# Patient Record
Sex: Male | Born: 1960 | Race: White | Hispanic: No | Marital: Married | Smoking: Current every day smoker
Health system: Southern US, Community
[De-identification: ages and names within clinical notes are randomized; demographics above are authoritative.]

## PROBLEM LIST (undated history)

## (undated) DIAGNOSIS — J45909 Unspecified asthma, uncomplicated: Secondary | ICD-10-CM

## (undated) DIAGNOSIS — K219 Gastro-esophageal reflux disease without esophagitis: Secondary | ICD-10-CM

## (undated) DIAGNOSIS — E785 Hyperlipidemia, unspecified: Secondary | ICD-10-CM

## (undated) DIAGNOSIS — J449 Chronic obstructive pulmonary disease, unspecified: Secondary | ICD-10-CM

## (undated) DIAGNOSIS — I10 Essential (primary) hypertension: Secondary | ICD-10-CM

## (undated) DIAGNOSIS — N200 Calculus of kidney: Secondary | ICD-10-CM

## (undated) HISTORY — PX: ESOPHAGOGASTRODUODENOSCOPY: SHX1529

## (undated) HISTORY — PX: OTHER SURGICAL HISTORY: SHX169

## (undated) HISTORY — DX: Chronic obstructive pulmonary disease, unspecified: J44.9

## (undated) HISTORY — DX: Gastro-esophageal reflux disease without esophagitis: K21.9

## (undated) HISTORY — DX: Unspecified asthma, uncomplicated: J45.909

## (undated) HISTORY — DX: Essential (primary) hypertension: I10

## (undated) HISTORY — PX: CHOLECYSTECTOMY: SHX55

## (undated) HISTORY — DX: Hyperlipidemia, unspecified: E78.5

## (undated) HISTORY — PX: BACK SURGERY: SHX140

## (undated) HISTORY — DX: Calculus of kidney: N20.0

---

## 1997-05-30 ENCOUNTER — Encounter: Admission: RE | Admit: 1997-05-30 | Discharge: 1997-08-28 | Payer: Self-pay | Admitting: Family Medicine

## 1997-06-08 ENCOUNTER — Ambulatory Visit: Admission: RE | Admit: 1997-06-08 | Discharge: 1997-06-08 | Payer: Self-pay | Admitting: Family Medicine

## 1997-10-07 ENCOUNTER — Ambulatory Visit: Admission: RE | Admit: 1997-10-07 | Discharge: 1997-10-07 | Payer: Self-pay | Admitting: *Deleted

## 2002-08-26 ENCOUNTER — Ambulatory Visit: Admission: RE | Admit: 2002-08-26 | Discharge: 2002-08-26 | Payer: Self-pay | Admitting: Family Medicine

## 2005-06-13 ENCOUNTER — Ambulatory Visit: Payer: Self-pay | Admitting: Gastroenterology

## 2005-06-27 ENCOUNTER — Ambulatory Visit: Payer: Self-pay | Admitting: Gastroenterology

## 2006-05-11 ENCOUNTER — Ambulatory Visit: Payer: Self-pay | Admitting: Gastroenterology

## 2006-05-13 ENCOUNTER — Ambulatory Visit: Payer: Self-pay | Admitting: Gastroenterology

## 2012-04-19 ENCOUNTER — Other Ambulatory Visit: Payer: Self-pay | Admitting: Nurse Practitioner

## 2012-05-04 ENCOUNTER — Other Ambulatory Visit: Payer: Self-pay | Admitting: Nurse Practitioner

## 2012-05-20 ENCOUNTER — Other Ambulatory Visit: Payer: Self-pay | Admitting: Nurse Practitioner

## 2012-05-31 ENCOUNTER — Ambulatory Visit (INDEPENDENT_AMBULATORY_CARE_PROVIDER_SITE_OTHER): Payer: BC Managed Care – PPO | Admitting: Nurse Practitioner

## 2012-05-31 ENCOUNTER — Encounter: Payer: Self-pay | Admitting: Nurse Practitioner

## 2012-05-31 ENCOUNTER — Telehealth: Payer: Self-pay | Admitting: Nurse Practitioner

## 2012-05-31 VITALS — BP 140/84 | HR 82 | Temp 98.0°F | Wt 284.0 lb

## 2012-05-31 DIAGNOSIS — I10 Essential (primary) hypertension: Secondary | ICD-10-CM | POA: Insufficient documentation

## 2012-05-31 DIAGNOSIS — J322 Chronic ethmoidal sinusitis: Secondary | ICD-10-CM

## 2012-05-31 DIAGNOSIS — E785 Hyperlipidemia, unspecified: Secondary | ICD-10-CM | POA: Insufficient documentation

## 2012-05-31 DIAGNOSIS — J45909 Unspecified asthma, uncomplicated: Secondary | ICD-10-CM

## 2012-05-31 DIAGNOSIS — J209 Acute bronchitis, unspecified: Secondary | ICD-10-CM

## 2012-05-31 DIAGNOSIS — K219 Gastro-esophageal reflux disease without esophagitis: Secondary | ICD-10-CM | POA: Insufficient documentation

## 2012-05-31 MED ORDER — HYDROCODONE-HOMATROPINE 5-1.5 MG/5ML PO SYRP: 5.0000 mL | ORAL_SOLUTION | Freq: Three times a day (TID) | ORAL | Status: DC | PRN

## 2012-05-31 MED ORDER — AMOXICILLIN 875 MG PO TABS: 875.0000 mg | ORAL_TABLET | Freq: Two times a day (BID) | ORAL | Status: DC

## 2012-05-31 NOTE — Progress Notes (Signed)
  Subjective:    Patient ID: David Ortega, male    DOB: 1960-04-08, 52 y.o.   MRN: [id number]  HPI-1. Patient in C/o headache and chest congestion- No fever- SLight cough 2.Pain left flank area which started Saturday- intermittent- coughing and moving makes pain start. Sitting still makes pain better.   Review of Systems  Constitutional: Negative for fever and chills.  HENT: Positive for congestion, rhinorrhea, postnasal drip and sinus pressure.   Respiratory: Positive for cough (nonproductive). Negative for shortness of breath and wheezing.   Cardiovascular: Positive for chest pain.  Genitourinary: Negative.   Psychiatric/Behavioral: Negative.        Objective:   Physical Exam  Constitutional: He is oriented to person, place, and time. He appears well-developed and well-nourished.  HENT:  Head: Normocephalic.  Right Ear: Tympanic membrane and external ear normal.  Left Ear: Tympanic membrane normal.  Nose: Mucosal edema and rhinorrhea present.  Mouth/Throat: Posterior oropharyngeal edema present.  Eyes: Pupils are equal, round, and reactive to light.  Neck: Normal range of motion.  Cardiovascular: Normal rate and normal heart sounds.   Pulmonary/Chest: Effort normal and breath sounds normal. He has no wheezes. He has no rales. He exhibits tenderness (mild left lower rib cage).  Abdominal: Soft. Bowel sounds are normal.  Lymphadenopathy:    He has no cervical adenopathy.  Neurological: He is alert and oriented to person, place, and time. He has normal reflexes.  Skin: Skin is warm.  Psychiatric: He has a normal mood and affect. His behavior is normal. Judgment and thought content normal.   BP 140/84  Pulse 82  Temp(Src) 98 F (36.7 C) (Oral)  Wt 284 lb (128.822 kg)        Assessment & Plan:  Bronchitis/ sinustis -Hycodan as RX -Amoxicillin as Rx First 24 Hours-Clear liquids  popsicles  Jello  gatorade  Sprite Second 24 hours-Add Full liquids ( Liquids you  cant see through) Third 24 hours- Bland diet ( foods that are baked or broiled)  *avoiding fried foods and highly spiced foods* During these 3 days  Avoid milk, cheese, ice cream or any other dairy products  Avoid caffeine- REMEMBER Mt. Dew and Mello Yellow contain lots of caffeine You should eat and drink in  Frequent small volumes If no improvement in symptoms or worsen in 2-3 days should RETRUN TO OFFICE or go to ER!    Mary-Margaret Daphine Deutscher, FNP

## 2012-05-31 NOTE — Patient Instructions (Signed)

## 2012-05-31 NOTE — Telephone Encounter (Signed)
APPT MADE

## 2012-06-06 ENCOUNTER — Other Ambulatory Visit: Payer: Self-pay | Admitting: Nurse Practitioner

## 2012-06-21 ENCOUNTER — Other Ambulatory Visit: Payer: Self-pay | Admitting: Nurse Practitioner

## 2012-06-23 NOTE — Telephone Encounter (Signed)
Patients last labs done on 11-09-11. Last seen in office on

## 2012-06-23 NOTE — Telephone Encounter (Signed)
Patients last labs done on 11-09-11. Last seen in office on 05-31-12 for acute visit. Please advise

## 2012-08-30 ENCOUNTER — Other Ambulatory Visit: Payer: Self-pay | Admitting: *Deleted

## 2012-08-30 MED ORDER — LOSARTAN POTASSIUM 50 MG PO TABS: ORAL_TABLET | ORAL | Status: DC

## 2012-10-04 ENCOUNTER — Other Ambulatory Visit: Payer: Self-pay | Admitting: *Deleted

## 2012-10-04 NOTE — Telephone Encounter (Signed)
Can't do refill on simvastatin until has labs

## 2012-10-04 NOTE — Telephone Encounter (Signed)
LAST LABS 10/13 

## 2012-10-20 ENCOUNTER — Other Ambulatory Visit: Payer: Self-pay | Admitting: *Deleted

## 2012-10-20 MED ORDER — SIMVASTATIN 40 MG PO TABS: 40.0000 mg | ORAL_TABLET | Freq: Every day | ORAL | Status: DC

## 2012-10-20 NOTE — Telephone Encounter (Signed)
LAST LIPIDS 10/13. NTBS.

## 2012-10-26 ENCOUNTER — Encounter: Payer: Self-pay | Admitting: Nurse Practitioner

## 2012-10-26 ENCOUNTER — Ambulatory Visit (INDEPENDENT_AMBULATORY_CARE_PROVIDER_SITE_OTHER): Payer: BC Managed Care – PPO | Admitting: Nurse Practitioner

## 2012-10-26 ENCOUNTER — Telehealth: Payer: Self-pay | Admitting: Nurse Practitioner

## 2012-10-26 VITALS — BP 143/90 | HR 79 | Temp 98.8°F | Ht 72.0 in | Wt 277.0 lb

## 2012-10-26 DIAGNOSIS — J069 Acute upper respiratory infection, unspecified: Secondary | ICD-10-CM

## 2012-10-26 DIAGNOSIS — R197 Diarrhea, unspecified: Secondary | ICD-10-CM

## 2012-10-26 MED ORDER — CIPROFLOXACIN HCL 500 MG PO TABS: 500.0000 mg | ORAL_TABLET | Freq: Two times a day (BID) | ORAL | Status: DC

## 2012-10-26 NOTE — Telephone Encounter (Signed)
APPT AMDE

## 2012-10-26 NOTE — Patient Instructions (Addendum)
Diarrhea Diarrhea is frequent loose and watery bowel movements. It can cause you to feel weak and dehydrated. Dehydration can cause you to become tired and thirsty, have a dry mouth, and have decreased urination that often is dark yellow. Diarrhea is a sign of another problem, most often an infection that will not last long. In most cases, diarrhea typically lasts 2 3 days. However, it can last longer if it is a sign of something more serious. It is important to treat your diarrhea as directed by your caregive to lessen or prevent future episodes of diarrhea. CAUSES  Some common causes include:  Gastrointestinal infections caused by viruses, bacteria, or parasites.  Food poisoning or food allergies.  Certain medicines, such as antibiotics, chemotherapy, and laxatives.  Artificial sweeteners and fructose.  Digestive disorders. HOME CARE INSTRUCTIONS  Ensure adequate fluid intake (hydration): have 1 cup (8 oz) of fluid for each diarrhea episode. Avoid fluids that contain simple sugars or sports drinks, fruit juices, whole milk products, and sodas. Your urine should be clear or pale yellow if you are drinking enough fluids. Hydrate with an oral rehydration solution that you can purchase at pharmacies, retail stores, and online. You can prepare an oral rehydration solution at home by mixing the following ingredients together:    tsp table salt.   tsp baking soda.   tsp salt substitute containing potassium chloride.  1  tablespoons sugar.  1 L (34 oz) of water.  Certain foods and beverages may increase the speed at which food moves through the gastrointestinal (GI) tract. These foods and beverages should be avoided and include:  Caffeinated and alcoholic beverages.  High-fiber foods, such as raw fruits and vegetables, nuts, seeds, and whole grain breads and cereals.  Foods and beverages sweetened with sugar alcohols, such as xylitol, sorbitol, and mannitol.  Some foods may be well  tolerated and may help thicken stool including:  Starchy foods, such as rice, toast, pasta, low-sugar cereal, oatmeal, grits, baked potatoes, crackers, and bagels.  Bananas.  Applesauce.  Add probiotic-rich foods to help increase healthy bacteria in the GI tract, such as yogurt and fermented milk products.  Wash your hands well after each diarrhea episode.  Only take over-the-counter or prescription medicines as directed by your caregiver.  Take a warm bath to relieve any burning or pain from frequent diarrhea episodes. SEEK IMMEDIATE MEDICAL CARE IF:   You are unable to keep fluids down.  You have persistent vomiting.  You have blood in your stool, or your stools are black and tarry.  You do not urinate in 6 8 hours, or there is only a small amount of very dark urine.  You have abdominal pain that increases or localizes.  You have weakness, dizziness, confusion, or lightheadedness.  You have a severe headache.  Your diarrhea gets worse or does not get better.  You have a fever or persistent symptoms for more than 2 3 days.  You have a fever and your symptoms suddenly get worse. MAKE SURE YOU:   Understand these instructions.  Will watch your condition.  Will get help right away if you are not doing well or get worse. Document Released: 01/03/2002 Document Revised: 12/31/2011 Document Reviewed: 09/21/2011 [facility] Patient Information 2014 [place], [place]. Diarrhea Diarrhea is frequent loose and watery bowel movements. It can cause you to feel weak and dehydrated. Dehydration can cause you to become tired and thirsty, have a dry mouth, and have decreased urination that often is dark yellow. Diarrhea is a  sign of another problem, most often an infection that will not last long. In most cases, diarrhea typically lasts 2 3 days. However, it can last longer if it is a sign of something more serious. It is important to treat your diarrhea as directed by your caregive to lessen  or prevent future episodes of diarrhea. CAUSES  Some common causes include:  Gastrointestinal infections caused by viruses, bacteria, or parasites.  Food poisoning or food allergies.  Certain medicines, such as antibiotics, chemotherapy, and laxatives.  Artificial sweeteners and fructose.  Digestive disorders. HOME CARE INSTRUCTIONS  Ensure adequate fluid intake (hydration): have 1 cup (8 oz) of fluid for each diarrhea episode. Avoid fluids that contain simple sugars or sports drinks, fruit juices, whole milk products, and sodas. Your urine should be clear or pale yellow if you are drinking enough fluids. Hydrate with an oral rehydration solution that you can purchase at pharmacies, retail stores, and online. You can prepare an oral rehydration solution at home by mixing the following ingredients together:    tsp table salt.   tsp baking soda.   tsp salt substitute containing potassium chloride.  1  tablespoons sugar.  1 L (34 oz) of water.  Certain foods and beverages may increase the speed at which food moves through the gastrointestinal (GI) tract. These foods and beverages should be avoided and include:  Caffeinated and alcoholic beverages.  High-fiber foods, such as raw fruits and vegetables, nuts, seeds, and whole grain breads and cereals.  Foods and beverages sweetened with sugar alcohols, such as xylitol, sorbitol, and mannitol.  Some foods may be well tolerated and may help thicken stool including:  Starchy foods, such as rice, toast, pasta, low-sugar cereal, oatmeal, grits, baked potatoes, crackers, and bagels.  Bananas.  Applesauce.  Add probiotic-rich foods to help increase healthy bacteria in the GI tract, such as yogurt and fermented milk products.  Wash your hands well after each diarrhea episode.  Only take over-the-counter or prescription medicines as directed by your caregiver.  Take a warm bath to relieve any burning or pain from frequent diarrhea  episodes. SEEK IMMEDIATE MEDICAL CARE IF:   You are unable to keep fluids down.  You have persistent vomiting.  You have blood in your stool, or your stools are black and tarry.  You do not urinate in 6 8 hours, or there is only a small amount of very dark urine.  You have abdominal pain that increases or localizes.  You have weakness, dizziness, confusion, or lightheadedness.  You have a severe headache.  Your diarrhea gets worse or does not get better.  You have a fever or persistent symptoms for more than 2 3 days.  You have a fever and your symptoms suddenly get worse. MAKE SURE YOU:   Understand these instructions.  Will watch your condition.  Will get help right away if you are not doing well or get worse. Document Released: 01/03/2002 Document Revised: 12/31/2011 Document Reviewed: 09/21/2011 [facility] Patient Information 2014 [place], [place].

## 2012-10-26 NOTE — Progress Notes (Signed)
  Subjective:    Patient ID: David Ortega, male    DOB: 1960-12-12, 52 y.o.   MRN: [id number]  HPI Patient in c/o nausea, diarrhea that started Sunday- Ate at [employer] Sat night- fried shrimp and fish. Also c/o headache and slight cough    Review of Systems  Constitutional: Positive for appetite change. Negative for fever, chills and fatigue.  HENT: Positive for congestion and rhinorrhea.   Respiratory: Positive for cough (nonproductive).   Cardiovascular: Negative.   Gastrointestinal: Positive for nausea and diarrhea.  Genitourinary: Negative.   Musculoskeletal: Negative.   Hematological: Negative.   Psychiatric/Behavioral: Negative.        Objective:   Physical Exam  Constitutional: He appears well-developed and well-nourished.  Cardiovascular: Normal rate, regular rhythm and normal heart sounds.   Pulmonary/Chest: Effort normal.  Abdominal: Soft. Bowel sounds are normal. He exhibits no distension and no mass. There is no tenderness. There is no guarding.    BP 143/90  Pulse 79  Temp(Src) 98.8 F (37.1 C) (Oral)  Ht 6' (1.829 m)  Wt 277 lb (125.646 kg)  BMI 37.56 kg/m2       Assessment & Plan:  1. Diarrhea immodium AD OTC Force fluids RTO if not improving  2. Upper respiratory infection Meds ordered this encounter  Medications  . ciprofloxacin (CIPRO) 500 MG tablet    Sig: Take 1 tablet (500 mg total) by mouth 2 (two) times daily.    Dispense:  20 tablet    Refill:  0    Order Specific Question:  Supervising Provider    Answer:  Ernestina Penna [[id number]]   1. Take meds as prescribed 2. Use a cool mist humidifier especially during the winter months and when heat has  been humid. 3. Use saline nose sprays frequently 4. Saline irrigations of the nose can be very helpful if done frequently.  * 4X daily for 1 week*  * Use of a nettie pot can be helpful with this. Follow directions with this* 5. Drink plenty of fluids 6. Keep thermostat turn down  low 7.For any cough or congestion  Use plain Mucinex- regular strength or max strength is fine   * Children- consult with Pharmacist for dosing 8. For fever or aces or pains- take tylenol or ibuprofen appropriate for age and weight.  * for fevers greater than 101 orally you may alternate ibuprofen and tylenol every  3 hours.   Mary-Margaret Daphine Deutscher, FNP

## 2012-11-04 ENCOUNTER — Other Ambulatory Visit: Payer: Self-pay

## 2012-11-04 MED ORDER — LOSARTAN POTASSIUM 50 MG PO TABS: ORAL_TABLET | ORAL | Status: DC

## 2012-11-12 ENCOUNTER — Telehealth: Payer: Self-pay | Admitting: Nurse Practitioner

## 2012-11-12 NOTE — Telephone Encounter (Signed)
appt made

## 2012-11-13 ENCOUNTER — Ambulatory Visit (INDEPENDENT_AMBULATORY_CARE_PROVIDER_SITE_OTHER): Payer: BC Managed Care – PPO | Admitting: Family Medicine

## 2012-11-13 ENCOUNTER — Encounter: Payer: Self-pay | Admitting: Family Medicine

## 2012-11-13 VITALS — BP 150/86 | HR 70 | Temp 97.9°F | Ht 72.0 in | Wt 283.0 lb

## 2012-11-13 DIAGNOSIS — J069 Acute upper respiratory infection, unspecified: Secondary | ICD-10-CM

## 2012-11-13 DIAGNOSIS — J029 Acute pharyngitis, unspecified: Secondary | ICD-10-CM

## 2012-11-13 LAB — POCT RAPID STREP A (OFFICE): Rapid Strep A Screen: NEGATIVE

## 2012-11-13 MED ORDER — AMOXICILLIN 875 MG PO TABS: 875.0000 mg | ORAL_TABLET | Freq: Two times a day (BID) | ORAL | Status: DC

## 2012-11-13 NOTE — Patient Instructions (Signed)

## 2012-11-13 NOTE — Progress Notes (Signed)
  Subjective:    Patient ID: TARIQUE LOVEALL, male    DOB: 14-Feb-1960, 52 y.o.   MRN: [id number]  HPI  This 52 y.o. male presents for evaluation of C/o uri sx's for over a week. He has sore throat and sinus congestion.  Review of Systems C/o sinus drainage and sore throat   No chest pain, SOB, HA, dizziness, vision change, N/V, diarrhea, constipation, dysuria, urinary urgency or frequency, myalgias, arthralgias or rash.  Objective:   Physical Exam Vital signs noted  Well developed well nourished male.  HEENT - Head atraumatic Normocephalic                Eyes - PERRLA, Conjuctiva - clear Sclera- Clear EOMI                Ears - EAC's Wnl TM's Wnl Gross Hearing WNL                Nose - Nares patent                 Throat - oropharanx wnl Respiratory - Lungs CTA bilateral Cardiac - RRR S1 and S2 without murmur GI - Abdomen soft Nontender and bowel sounds active x 4 Extremities - No edema. Neuro - Grossly intact.  Results for orders placed in visit on 11/13/12  POCT RAPID STREP A (OFFICE)      Result Value Range   Rapid Strep A Screen Negative  Negative       Assessment & Plan:  Sore throat - Plan: POCT rapid strep A, amoxicillin (AMOXIL) 875 MG tablet  URI (upper respiratory infection) - Plan: amoxicillin (AMOXIL) 875 MG   WSWG's prn, tylenol and motrin otc prn as directed.  Followup prn.  Deatra Canter FNP

## 2012-12-02 ENCOUNTER — Other Ambulatory Visit: Payer: Self-pay | Admitting: Nurse Practitioner

## 2012-12-02 MED ORDER — DOXYCYCLINE HYCLATE 100 MG PO TABS: 100.0000 mg | ORAL_TABLET | Freq: Two times a day (BID) | ORAL | Status: DC

## 2012-12-30 ENCOUNTER — Encounter: Payer: Self-pay | Admitting: Family Medicine

## 2012-12-30 ENCOUNTER — Ambulatory Visit (INDEPENDENT_AMBULATORY_CARE_PROVIDER_SITE_OTHER): Payer: BC Managed Care – PPO | Admitting: Family Medicine

## 2012-12-30 VITALS — BP 146/82 | HR 83 | Temp 98.8°F | Wt 279.8 lb

## 2012-12-30 DIAGNOSIS — J45909 Unspecified asthma, uncomplicated: Secondary | ICD-10-CM

## 2012-12-30 DIAGNOSIS — J209 Acute bronchitis, unspecified: Secondary | ICD-10-CM

## 2012-12-30 DIAGNOSIS — J069 Acute upper respiratory infection, unspecified: Secondary | ICD-10-CM

## 2012-12-30 DIAGNOSIS — J029 Acute pharyngitis, unspecified: Secondary | ICD-10-CM

## 2012-12-30 MED ORDER — METHYLPREDNISOLONE (PAK) 4 MG PO TABS: ORAL_TABLET | ORAL | Status: DC

## 2012-12-30 MED ORDER — ALBUTEROL SULFATE HFA 108 (90 BASE) MCG/ACT IN AERS: 2.0000 | INHALATION_SPRAY | Freq: Four times a day (QID) | RESPIRATORY_TRACT | Status: DC | PRN

## 2012-12-30 MED ORDER — AMOXICILLIN 875 MG PO TABS: 875.0000 mg | ORAL_TABLET | Freq: Two times a day (BID) | ORAL | Status: DC

## 2012-12-30 NOTE — Progress Notes (Signed)
   Subjective:    Patient ID: JAVELLE DONIGAN, male    DOB: 10/20/1960, 52 y.o.   MRN: [id number]  HPI  This 52 y.o. male presents for evaluation of cough and sore throat.  He has Hx of asthma and has been a long time smoker.  He has been using his saba Frequently and needs a refill.  He gets SOB.  Review of Systems C/o sore throat, cough, and SOB No chest pain, HA, dizziness, vision change, N/V, diarrhea, constipation, dysuria, urinary urgency or frequency, myalgias, arthralgias or rash.     Objective:   Physical Exam  Vital signs noted  Well developed well nourished male.  HEENT - Head atraumatic Normocephalic                Eyes - PERRLA, Conjuctiva - clear Sclera- Clear EOMI                Ears - EAC's Wnl TM's Wnl Gross Hearing WNL                Nose - Nares patent                 Throat - oropharanx wnl Respiratory - Lungs diminished bilateral Cardiac - RRR S1 and S2 without murmur GI - Abdomen soft Nontender and bowel sounds active x 4 Extremities - No edema. Neuro - Grossly intact.  Results for orders placed in visit on 11/13/12  POCT RAPID STREP A (OFFICE)      Result Value Range   Rapid Strep A Screen Negative  Negative      Assessment & Plan:  Sore throat - Plan: amoxicillin (AMOXIL) 875 MG tablet, methylPREDNIsolone (MEDROL DOSPACK) 4 MG tablet  URI (upper respiratory infection) - Plan: amoxicillin (AMOXIL) 875 MG tablet, methylPREDNIsolone (MEDROL DOSPACK) 4 MG tablet  Acute bronchitis - Plan: amoxicillin (AMOXIL) 875 MG tablet, methylPREDNIsolone (MEDROL DOSPACK) 4 MG tablet, albuterol (PROVENTIL HFA;VENTOLIN HFA) 108 (90 BASE) MCG/ACT inhaler.  Symbicort samples given and Tudorza samples given.  Asthma - Plan: albuterol (PROVENTIL HFA;VENTOLIN HFA) 108 (90 BASE) MCG/ACT inhaler  Push po fluids, rest, tylenol and motrin otc prn as directed for fever, arthralgias, and myalgias.  Follow up prn if sx's continue or persist.  Deatra Canter FNP

## 2012-12-30 NOTE — Patient Instructions (Signed)

## 2013-01-28 ENCOUNTER — Telehealth: Payer: Self-pay | Admitting: Nurse Practitioner

## 2013-01-28 NOTE — Telephone Encounter (Signed)
Productive cough x 2 months. Advised to quit smoking. Most likely product of COPD. No available appts today. Patient will come in tomorrow morning.

## 2013-01-29 ENCOUNTER — Encounter (INDEPENDENT_AMBULATORY_CARE_PROVIDER_SITE_OTHER): Payer: Self-pay

## 2013-01-29 ENCOUNTER — Ambulatory Visit (INDEPENDENT_AMBULATORY_CARE_PROVIDER_SITE_OTHER): Payer: BC Managed Care – PPO | Admitting: Family Medicine

## 2013-01-29 ENCOUNTER — Ambulatory Visit (INDEPENDENT_AMBULATORY_CARE_PROVIDER_SITE_OTHER): Payer: BC Managed Care – PPO

## 2013-01-29 VITALS — BP 142/86 | HR 86 | Temp 99.3°F | Ht 72.0 in | Wt 274.0 lb

## 2013-01-29 DIAGNOSIS — R059 Cough, unspecified: Secondary | ICD-10-CM

## 2013-01-29 DIAGNOSIS — J329 Chronic sinusitis, unspecified: Secondary | ICD-10-CM

## 2013-01-29 DIAGNOSIS — J310 Chronic rhinitis: Secondary | ICD-10-CM

## 2013-01-29 DIAGNOSIS — R05 Cough: Secondary | ICD-10-CM

## 2013-01-29 DIAGNOSIS — R51 Headache: Secondary | ICD-10-CM

## 2013-01-29 LAB — POCT CBC
GRANULOCYTE PERCENT: 69 % (ref 37–80)
HCT, POC: 43.8 % (ref 43.5–53.7)
Hemoglobin: 14.2 g/dL (ref 14.1–18.1)
Lymph, poc: 2.2 (ref 0.6–3.4)
MCH, POC: 30.9 pg (ref 27–31.2)
MCHC: 32.5 g/dL (ref 31.8–35.4)
MCV: 95.3 fL (ref 80–97)
MPV: 8.5 fL (ref 0–99.8)
PLATELET COUNT, POC: 199 10*3/uL (ref 142–424)
POC Granulocyte: 6.1 (ref 2–6.9)
POC LYMPH PERCENT: 24.6 %L (ref 10–50)
RBC: 4.6 M/uL — AB (ref 4.69–6.13)
RDW, POC: 13.7 %
WBC: 8.9 10*3/uL (ref 4.6–10.2)

## 2013-01-29 MED ORDER — AMOXICILLIN-POT CLAVULANATE 875-125 MG PO TABS: 1.0000 | ORAL_TABLET | Freq: Two times a day (BID) | ORAL | Status: DC

## 2013-01-29 NOTE — Patient Instructions (Signed)
For now, continue both inhalers. Use Mucinex maximum strength, blue and white in color, over-the-counter, one twice daily with a large glass of water Drink plenty of fluids Take Tylenol for aches pains and fever Use a cool mist humidifier in her bedroom at nighttime Use saline nose spray frequently during the day

## 2013-01-29 NOTE — Progress Notes (Signed)
Subjective:    Patient ID: David Ortega, male    DOB: 1960-10-01, 53 y.o.   MRN: [id number]  HPI 2-3 month history of cough, hoarseness, and facial pain/pressure. He has been treated several times over the past couple of months. Symptoms will improve but do not completley resolve. He takes Human resources officer daily. No medication for cough or congestion used. He has not had to use his rescue inhaler.   Review of Systems  Constitutional: Positive for activity change and fatigue. Negative for fever and appetite change.  HENT: Positive for congestion and sinus pressure. Voice change: hoarseness.   Eyes: Negative.   Respiratory: Positive for cough and wheezing (mild wheezing at times). Negative for chest tightness and shortness of breath.   Cardiovascular: Negative.   Gastrointestinal: Negative.   Endocrine: Negative.   Genitourinary: Negative.   Musculoskeletal: Negative.   Skin: Negative.   Neurological: Negative for headaches.  Hematological: Negative.   Psychiatric/Behavioral: Negative.        Objective:   Physical Exam  Nursing note and vitals reviewed. Constitutional: He is oriented to person, place, and time. He appears well-developed and well-nourished. No distress.  HENT:  Head: Normocephalic and atraumatic.  Right Ear: External ear normal.  Left Ear: External ear normal.  Mouth/Throat: Oropharynx is clear and moist. No oropharyngeal exudate.  Ethmoid sinus tenderness. Nasal congestion bilaterally.  Eyes: Conjunctivae and EOM are normal. Pupils are equal, round, and reactive to light. Right eye exhibits no discharge. Left eye exhibits no discharge. No scleral icterus.  Neck: Normal range of motion. Neck supple. No thyromegaly present.  Cardiovascular: Normal rate, regular rhythm, normal heart sounds and intact distal pulses.  Exam reveals no gallop and no friction rub.   No murmur heard. At 72 per minute  Pulmonary/Chest: Effort normal and breath sounds normal. No respiratory  distress. He has no wheezes. He has no rales. He exhibits no tenderness.  Abdominal: Soft. Bowel sounds are normal. He exhibits no mass. There is no tenderness. There is no rebound and no guarding.  Musculoskeletal: Normal range of motion.  Lymphadenopathy:    He has no cervical adenopathy.  Neurological: He is alert and oriented to person, place, and time. He has normal reflexes.  Skin: Skin is warm and dry. No rash noted. No erythema. No pallor.  Psychiatric: He has a normal mood and affect. His behavior is normal. Judgment and thought content normal.    Results for orders placed in visit on 01/29/13  POCT CBC      Result Value Range   WBC 8.9  4.6 - 10.2 K/uL   Lymph, poc 2.2  0.6 - 3.4   POC LYMPH PERCENT 24.6  10 - 50 %L   POC Granulocyte 6.1  2 - 6.9   Granulocyte percent 69.0  37 - 80 %G   RBC 4.6 (*) 4.69 - 6.13 M/uL   Hemoglobin 14.2  14.1 - 18.1 g/dL   HCT, POC 43.8  43.5 - 53.7 %   MCV 95.3  80 - 97 fL   MCH, POC 30.9  27 - 31.2 pg   MCHC 32.5  31.8 - 35.4 g/dL   RDW, POC 13.7     Platelet Count, POC 199.0  142 - 424 K/uL   MPV 8.5  0 - 99.8 fL         Assessment & Plan:   1. Rhinosinusitis - POCT CBC - amoxicillin-clavulanate (AUGMENTIN) 875-125 MG per tablet; Take 1 tablet by mouth 2 (two) times  daily.  Dispense: 20 tablet; Refill: 0  2. Cough -Chest x-ray early next week  3. Headache(784.0) Patient Instructions  For now, continue both inhalers. Use Mucinex maximum strength, blue and white in color, over-the-counter, one twice daily with a large glass of water Drink plenty of fluids Take Tylenol for aches pains and fever Use a cool mist humidifier in her bedroom at nighttime Use saline nose spray frequently during the day    Arrie Senate MD

## 2013-02-03 ENCOUNTER — Telehealth: Payer: Self-pay | Admitting: Family Medicine

## 2013-02-03 NOTE — Telephone Encounter (Signed)
Message copied by Waverly Ferrari on Thu Feb 03, 2013  2:51 PM ------      Message from: Chipper Herb      Created: Sat Jan 29, 2013 12:33 PM       The white blood cell count was not elevated. The hemoglobin was good at 14.2. The platelet count was adequate       Take medication as directed ------

## 2013-02-08 ENCOUNTER — Ambulatory Visit (INDEPENDENT_AMBULATORY_CARE_PROVIDER_SITE_OTHER): Payer: BC Managed Care – PPO

## 2013-02-08 DIAGNOSIS — R0989 Other specified symptoms and signs involving the circulatory and respiratory systems: Secondary | ICD-10-CM

## 2013-02-08 LAB — POCT CBC
Granulocyte percent: 65.3 %G (ref 37–80)
HCT, POC: 42.3 % — AB (ref 43.5–53.7)
Hemoglobin: 13.5 g/dL — AB (ref 14.1–18.1)
LYMPH, POC: 2.7 (ref 0.6–3.4)
MCH: 30.3 pg (ref 27–31.2)
MCHC: 31.9 g/dL (ref 31.8–35.4)
MCV: 94.7 fL (ref 80–97)
MPV: 8.8 fL (ref 0–99.8)
PLATELET COUNT, POC: 212 10*3/uL (ref 142–424)
POC Granulocyte: 6 (ref 2–6.9)
POC LYMPH PERCENT: 29.2 %L (ref 10–50)
RBC: 4.5 M/uL — AB (ref 4.69–6.13)
RDW, POC: 14 %
WBC: 9.2 10*3/uL (ref 4.6–10.2)

## 2013-02-10 ENCOUNTER — Telehealth: Payer: Self-pay | Admitting: Family Medicine

## 2013-02-10 NOTE — Telephone Encounter (Signed)
Message copied by Waverly Ferrari on Thu Feb 10, 2013 11:30 AM ------      Message from: Chipper Herb      Created: Tue Feb 08, 2013  8:43 PM       The CBC has a normal white blood cell count, and hemoglobin is slightly decreased at 13.5 and a platelet count that is adequate----- I am not sure why the CBC was repeated as the previous one was within normal limits????? ------

## 2013-02-11 ENCOUNTER — Telehealth: Payer: Self-pay

## 2013-02-11 NOTE — Telephone Encounter (Signed)
Message copied by Koren Bound on Fri Feb 11, 2013  8:33 AM ------      Message from: Chipper Herb      Created: Tue Feb 08, 2013  5:13 PM       As per radiology report ------

## 2013-02-11 NOTE — Telephone Encounter (Signed)
[facility] for CXR results

## 2013-02-11 NOTE — Telephone Encounter (Signed)
Message copied by Koren Bound on Fri Feb 11, 2013  2:23 PM ------      Message from: Chipper Herb      Created: Tue Feb 08, 2013  5:13 PM       As per radiology report ------

## 2013-02-11 NOTE — Telephone Encounter (Signed)
Pt aware of CXR results.

## 2013-02-28 ENCOUNTER — Ambulatory Visit: Payer: BC Managed Care – PPO | Admitting: Family Medicine

## 2013-03-03 ENCOUNTER — Other Ambulatory Visit: Payer: Self-pay | Admitting: Nurse Practitioner

## 2013-03-03 MED ORDER — OMEPRAZOLE 20 MG PO CPDR: 20.0000 mg | DELAYED_RELEASE_CAPSULE | Freq: Every day | ORAL | Status: DC

## 2013-03-07 ENCOUNTER — Other Ambulatory Visit: Payer: Self-pay | Admitting: Nurse Practitioner

## 2013-03-07 MED ORDER — OMEPRAZOLE 40 MG PO CPDR: 40.0000 mg | DELAYED_RELEASE_CAPSULE | Freq: Every day | ORAL | Status: DC

## 2013-04-21 ENCOUNTER — Telehealth: Payer: Self-pay | Admitting: Nurse Practitioner

## 2013-04-21 ENCOUNTER — Ambulatory Visit (INDEPENDENT_AMBULATORY_CARE_PROVIDER_SITE_OTHER): Payer: 59 | Admitting: Nurse Practitioner

## 2013-04-21 ENCOUNTER — Ambulatory Visit (INDEPENDENT_AMBULATORY_CARE_PROVIDER_SITE_OTHER): Payer: 59

## 2013-04-21 ENCOUNTER — Encounter: Payer: Self-pay | Admitting: Nurse Practitioner

## 2013-04-21 VITALS — BP 136/88 | HR 89 | Temp 98.7°F | Ht 72.0 in | Wt 268.0 lb

## 2013-04-21 DIAGNOSIS — D72829 Elevated white blood cell count, unspecified: Secondary | ICD-10-CM

## 2013-04-21 DIAGNOSIS — R109 Unspecified abdominal pain: Secondary | ICD-10-CM

## 2013-04-21 LAB — POCT URINALYSIS DIPSTICK
BILIRUBIN UA: NEGATIVE
Blood, UA: NEGATIVE
Glucose, UA: NEGATIVE
KETONES UA: NEGATIVE
LEUKOCYTES UA: NEGATIVE
NITRITE UA: NEGATIVE
Spec Grav, UA: 1.02
Urobilinogen, UA: NEGATIVE
pH, UA: 6.5

## 2013-04-21 LAB — POCT CBC
GRANULOCYTE PERCENT: 64.8 % (ref 37–80)
HEMATOCRIT: 45.3 % (ref 43.5–53.7)
Hemoglobin: 14.6 g/dL (ref 14.1–18.1)
Lymph, poc: 3.8 — AB (ref 0.6–3.4)
MCH, POC: 30.7 pg (ref 27–31.2)
MCHC: 32.2 g/dL (ref 31.8–35.4)
MCV: 95.2 fL (ref 80–97)
MPV: 7.9 fL (ref 0–99.8)
POC Granulocyte: 8.1 — AB (ref 2–6.9)
POC LYMPH PERCENT: 30 %L (ref 10–50)
Platelet Count, POC: 261 10*3/uL (ref 142–424)
RBC: 4.8 M/uL (ref 4.69–6.13)
RDW, POC: 14.3 %
WBC: 12.5 10*3/uL — AB (ref 4.6–10.2)

## 2013-04-21 MED ORDER — CIPROFLOXACIN HCL 500 MG PO TABS: 500.0000 mg | ORAL_TABLET | Freq: Two times a day (BID) | ORAL | Status: DC

## 2013-04-21 MED ORDER — METRONIDAZOLE 500 MG PO TABS
500.0000 mg | ORAL_TABLET | Freq: Three times a day (TID) | ORAL | Status: DC
Start: 2013-04-21 — End: 2013-05-03

## 2013-04-21 NOTE — Progress Notes (Signed)
Subjective:    Patient ID: David Ortega, male    DOB: [birth year], 53 y.o.   MRN: [id number]  HPI Patient in c/o nausea and abdominal pain- started yesterday- Feels like wants to throw up- dizziness intermittently- diarrhea-- no fever    Review of Systems  Constitutional: Positive for appetite change. Negative for fever.  Respiratory: Negative.   Cardiovascular: Negative.   Gastrointestinal: Positive for nausea, abdominal pain and diarrhea. Negative for vomiting.  Genitourinary: Negative.   All other systems reviewed and are negative.       Objective:   Physical Exam  Constitutional: He is oriented to person, place, and time. He appears well-developed and well-nourished.  Cardiovascular: Normal rate, regular rhythm and normal heart sounds.   Pulmonary/Chest: Effort normal and breath sounds normal.  Abdominal: Soft. Bowel sounds are normal. He exhibits no distension and no mass. There is no tenderness. There is no rebound and no guarding.  Neurological: He is alert and oriented to person, place, and time.  Skin: Skin is warm and dry.  Psychiatric: He has a normal mood and affect. His behavior is normal. Judgment and thought content normal.   BP 136/88  Pulse 89  Temp(Src) 98.7 F (37.1 C) (Oral)  Ht 6' (1.829 m)  Wt 268 lb (121.564 kg)  BMI 36.34 kg/m2 Results for orders placed in visit on 04/21/13  POCT CBC      Result Value Ref Range   WBC 12.5 (*) 4.6 - 10.2 K/uL   Lymph, poc 3.8 (*) 0.6 - 3.4   POC LYMPH PERCENT 30.0  10 - 50 %L   POC Granulocyte 8.1 (*) 2 - 6.9   Granulocyte percent 64.8  37 - 80 %G   RBC 4.8  4.69 - 6.13 M/uL   Hemoglobin 14.6  14.1 - 18.1 g/dL   HCT, POC 45.3  43.5 - 53.7 %   MCV 95.2  80 - 97 fL   MCH, POC 30.7  27 - 31.2 pg   MCHC 32.2  31.8 - 35.4 g/dL   RDW, POC 14.3     Platelet Count, POC 261.0  142 - 424 K/uL   MPV 7.9  0 - 99.8 fL  POCT URINALYSIS DIPSTICK      Result Value Ref Range   Color, UA yellow     Clarity, UA clear      Glucose, UA neg     Bilirubin, UA neg     Ketones, UA neg     Spec Grav, UA 1.020     Blood, UA neg     pH, UA 6.5     Protein, UA trace     Urobilinogen, UA negative     Nitrite, UA neg     Leukocytes, UA Negative     KUB- no acute findings possible left ureteral calculi-Preliminary reading by Ronnald Collum, FNP  [facility]        Assessment & Plan:   1. Abdominal pain, other specified site   2. Elevated WBC count    ? Diverticulitis Meds ordered this encounter  Medications  . HYDROcodone-acetaminophen (NORCO/VICODIN) 5-325 MG per tablet    Sig:   . ciprofloxacin (CIPRO) 500 MG tablet    Sig: Take 1 tablet (500 mg total) by mouth 2 (two) times daily.    Dispense:  20 tablet    Refill:  0    Order Specific Question:  Supervising Provider    Answer:  Chipper Herb [[id number]]  . metroNIDAZOLE (FLAGYL)  500 MG tablet    Sig: Take 1 tablet (500 mg total) by mouth 3 (three) times daily.    Dispense:  21 tablet    Refill:  0    Order Specific Question:  Supervising Provider    Answer:  Chipper Herb [[id number]]   First 24 Hours-Clear liquids  popsicles  Jello  gatorade  Sprite Second 24 hours-Add Full liquids ( Liquids you cant see through) Third 24 hours- Bland diet ( foods that are baked or broiled)  *avoiding fried foods and highly spiced foods* During these 3 days  Avoid milk, cheese, ice cream or any other dairy products  Avoid caffeine- REMEMBER Mt. Dew and Mello Yellow contain lots of caffeine You should eat and drink in  Frequent small volumes If no improvement in symptoms or worsen in 2-3 days should RETRUN TO OFFICE or go to ER!    Mary-Margaret Hassell Done, FNP

## 2013-04-21 NOTE — Telephone Encounter (Signed)
appt made

## 2013-04-21 NOTE — Patient Instructions (Addendum)
Diverticulitis A diverticulum is a small pouch or sac on the colon. Diverticulosis is the presence of these diverticula on the colon. Diverticulitis is the irritation (inflammation) or infection of diverticula. CAUSES  The colon and its diverticula contain bacteria. If food particles block the tiny opening to a diverticulum, the bacteria inside can grow and cause an increase in pressure. This leads to infection and inflammation and is called diverticulitis. SYMPTOMS   Abdominal pain and tenderness. Usually, the pain is located on the left side of your abdomen. However, it could be located elsewhere.  Fever.  Bloating.  Feeling sick to your stomach (nausea).  Throwing up (vomiting).  Abnormal stools. DIAGNOSIS  Your caregiver will take a history and perform a physical exam. Since many things can cause abdominal pain, other tests may be necessary. Tests may include:  Blood tests.  Urine tests.  X-ray of the abdomen.  CT scan of the abdomen. Sometimes, surgery is needed to determine if diverticulitis or other conditions are causing your symptoms. TREATMENT  Most of the time, you can be treated without surgery. Treatment includes:  Resting the bowels by only having liquids for a few days. As you improve, you will need to eat a low-fiber diet.  Intravenous (IV) fluids if you are losing body fluids (dehydrated).  Antibiotic medicines that treat infections may be given.  Pain and nausea medicine, if needed.  Surgery if the inflamed diverticulum has burst. HOME CARE INSTRUCTIONS   Try a clear liquid diet (broth, tea, or water for as long as directed by your caregiver). You may then gradually begin a low-fiber diet as tolerated.  A low-fiber diet is a diet with less than 10 grams of fiber. Choose the foods below to reduce fiber in the diet:  White breads, cereals, rice, and pasta.  Cooked fruits and vegetables or soft fresh fruits and vegetables without the skin.  Ground or  well-cooked tender beef, ham, veal, lamb, pork, or poultry.  Eggs and seafood.  After your diverticulitis symptoms have improved, your caregiver may put you on a high-fiber diet. A high-fiber diet includes 14 grams of fiber for every 1000 calories consumed. For a standard 2000 calorie diet, you would need 28 grams of fiber. Follow these diet guidelines to help you increase the fiber in your diet. It is important to slowly increase the amount fiber in your diet to avoid gas, constipation, and bloating.  Choose whole-grain breads, cereals, pasta, and brown rice.  Choose fresh fruits and vegetables with the skin on. Do not overcook vegetables because the more vegetables are cooked, the more fiber is lost.  Choose more nuts, seeds, legumes, dried peas, beans, and lentils.  Look for food products that have greater than 3 grams of fiber per serving on the Nutrition Facts label.  Take all medicine as directed by your caregiver.  If your caregiver has given you a follow-up appointment, it is very important that you go. Not going could result in lasting (chronic) or permanent injury, pain, and disability. If there is any problem keeping the appointment, call to reschedule. SEEK MEDICAL CARE IF:   Your pain does not improve.  You have a hard time advancing your diet beyond clear liquids.  Your bowel movements do not return to normal. SEEK IMMEDIATE MEDICAL CARE IF:   Your pain becomes worse.  You have an oral temperature above 102 F (38.9 C), not controlled by medicine.  You have repeated vomiting.  You have bloody or black, tarry stools.    Symptoms that brought you to your caregiver become worse or are not getting better. MAKE SURE YOU:   Understand these instructions.  Will watch your condition.  Will get help right away if you are not doing well or get worse. Document Released: 10/23/2004 Document Revised: 04/07/2011 Document Reviewed: 02/18/2010 [facility] Patient Information  2014 [place].  First 24 Hours-Clear liquids  popsicles  Jello  gatorade  Sprite Second 24 hours-Add Full liquids ( Liquids you cant see through) Third 24 hours- Bland diet ( foods that are baked or broiled)  *avoiding fried foods and highly spiced foods* During these 3 days  Avoid milk, cheese, ice cream or any other dairy products  Avoid caffeine- REMEMBER Mt. Dew and Mello Yellow contain lots of caffeine You should eat and drink in  Frequent small volumes If no improvement in symptoms or worsen in 2-3 days should RETRUN TO OFFICE or go to ER!

## 2013-05-03 ENCOUNTER — Telehealth: Payer: Self-pay | Admitting: Nurse Practitioner

## 2013-05-03 ENCOUNTER — Encounter: Payer: Self-pay | Admitting: Nurse Practitioner

## 2013-05-03 ENCOUNTER — Ambulatory Visit (INDEPENDENT_AMBULATORY_CARE_PROVIDER_SITE_OTHER): Payer: 59 | Admitting: Nurse Practitioner

## 2013-05-03 ENCOUNTER — Ambulatory Visit (INDEPENDENT_AMBULATORY_CARE_PROVIDER_SITE_OTHER): Payer: 59

## 2013-05-03 VITALS — BP 136/73 | HR 69 | Temp 98.5°F | Ht 72.0 in | Wt 266.4 lb

## 2013-05-03 DIAGNOSIS — R319 Hematuria, unspecified: Secondary | ICD-10-CM

## 2013-05-03 DIAGNOSIS — N200 Calculus of kidney: Secondary | ICD-10-CM

## 2013-05-03 LAB — POCT UA - MICROSCOPIC ONLY
Casts, Ur, LPF, POC: NEGATIVE
Crystals, Ur, HPF, POC: NEGATIVE
MUCUS UA: NEGATIVE
Yeast, UA: NEGATIVE

## 2013-05-03 LAB — POCT URINALYSIS DIPSTICK
BILIRUBIN UA: NEGATIVE
GLUCOSE UA: NEGATIVE
Ketones, UA: NEGATIVE
Leukocytes, UA: NEGATIVE
NITRITE UA: NEGATIVE
SPEC GRAV UA: 1.025
UROBILINOGEN UA: NEGATIVE
pH, UA: 6

## 2013-05-03 NOTE — Patient Instructions (Signed)
Kidney Stones  Kidney stones (urolithiasis) are deposits that form inside your kidneys. The intense pain is caused by the stone moving through the urinary tract. When the stone moves, the ureter goes into spasm around the stone. The stone is usually passed in the urine.   CAUSES   · A disorder that makes certain neck glands produce too much parathyroid hormone (primary hyperparathyroidism).  · A buildup of uric acid crystals, similar to gout in your joints.  · Narrowing (stricture) of the ureter.  · A kidney obstruction present at birth (congenital obstruction).  · Previous surgery on the kidney or ureters.  · Numerous kidney infections.  SYMPTOMS   · Feeling sick to your stomach (nauseous).  · Throwing up (vomiting).  · Blood in the urine (hematuria).  · Pain that usually spreads (radiates) to the groin.  · Frequency or urgency of urination.  DIAGNOSIS   · Taking a history and physical exam.  · Blood or urine tests.  · CT scan.  · Occasionally, an examination of the inside of the urinary bladder (cystoscopy) is performed.  TREATMENT   · Observation.  · Increasing your fluid intake.  · Extracorporeal shock wave lithotripsy This is a noninvasive procedure that uses shock waves to break up kidney stones.  · Surgery may be needed if you have severe pain or persistent obstruction. There are various surgical procedures. Most of the procedures are performed with the use of small instruments. Only small incisions are needed to accommodate these instruments, so recovery time is minimized.  The size, location, and chemical composition are all important variables that will determine the proper choice of action for you. Talk to your health care provider to better understand your situation so that you will minimize the risk of injury to yourself and your kidney.   HOME CARE INSTRUCTIONS   · Drink enough water and fluids to keep your urine clear or pale yellow. This will help you to pass the stone or stone fragments.  · Strain  all urine through the provided strainer. Keep all particulate matter and stones for your health care provider to see. The stone causing the pain may be as small as a grain of salt. It is very important to use the strainer each and every time you pass your urine. The collection of your stone will allow your health care provider to analyze it and verify that a stone has actually passed. The stone analysis will often identify what you can do to reduce the incidence of recurrences.  · Only take over-the-counter or prescription medicines for pain, discomfort, or fever as directed by your health care provider.  · Make a follow-up appointment with your health care provider as directed.  · Get follow-up X-rays if required. The absence of pain does not always mean that the stone has passed. It may have only stopped moving. If the urine remains completely obstructed, it can cause loss of kidney function or even complete destruction of the kidney. It is your responsibility to make sure X-rays and follow-ups are completed. Ultrasounds of the kidney can show blockages and the status of the kidney. Ultrasounds are not associated with any radiation and can be performed easily in a matter of minutes.  SEEK MEDICAL CARE IF:  · You experience pain that is progressive and unresponsive to any pain medicine you have been prescribed.  SEEK IMMEDIATE MEDICAL CARE IF:   · Pain cannot be controlled with the prescribed medicine.  · You have a fever   or shaking chills.  · The severity or intensity of pain increases over 18 hours and is not relieved by pain medicine.  · You develop a new onset of abdominal pain.  · You feel faint or pass out.  · You are unable to urinate.  MAKE SURE YOU:   · Understand these instructions.  · Will watch your condition.  · Will get help right away if you are not doing well or get worse.  Document Released: 01/13/2005 Document Revised: 09/15/2012 Document Reviewed: 06/16/2012  [employer]® Patient Information ©2014  [employer].

## 2013-05-03 NOTE — Telephone Encounter (Signed)
appt at 11:15 with Ronnald Collum

## 2013-05-03 NOTE — Progress Notes (Signed)
   Subjective:    Patient ID: David Ortega, male    DOB: 1960-09-07, 53 y.o.   MRN: [id number]  HPI Patient presents today complaining of hematuria. Patient first noticed blood in his urine a week ago but then it cleared. This morning his urine "looked like cranberry sauce".  Associated symptoms include intermittent abdominal pain. Patient was seen on March 26 and had a KUB xray done which revealed possible bilateral kidney stones. Patient has history of kidney stones several times in the past and was able to pass them without surgery.   Review of Systems  Respiratory: Negative for shortness of breath.   Cardiovascular: Negative for chest pain.  Gastrointestinal: Negative for nausea and abdominal distention.  Genitourinary: Positive for hematuria. Negative for dysuria, frequency and flank pain.  All other systems reviewed and are negative.       Objective:   Physical Exam  Constitutional: He is oriented to person, place, and time.  Cardiovascular: Normal rate, regular rhythm and normal heart sounds.   Pulmonary/Chest: Effort normal and breath sounds normal.  Abdominal: Soft. Bowel sounds are normal. He exhibits no mass. There is no tenderness. There is no rebound and no guarding.  Neurological: He is alert and oriented to person, place, and time.  Psychiatric: He has a normal mood and affect. His behavior is normal. Judgment and thought content normal.    BP 136/73  Pulse 69  Temp(Src) 98.5 F (36.9 C) (Oral)  Ht 6' (1.829 m)  Wt 266 lb 6.4 oz (120.838 kg)  BMI 36.12 kg/m2   Results for orders placed in visit on 05/03/13  POCT UA - MICROSCOPIC ONLY      Result Value Ref Range   WBC, Ur, HPF, POC 5-8     RBC, urine, microscopic tntc     Bacteria, U Microscopic OCC     Mucus, UA NEG     Epithelial cells, urine per micros OCC     Crystals, Ur, HPF, POC NEG     Casts, Ur, LPF, POC NEG     Yeast, UA NEG    POCT URINALYSIS DIPSTICK      Result Value Ref Range   Color,  UA TURBID     Clarity, UA CLOUDY     Glucose, UA NEG     Bilirubin, UA NEG     Ketones, UA NEG     Spec Grav, UA 1.025     Blood, UA LARGE     pH, UA 6.0     Protein, UA 4+     Urobilinogen, UA negative     Nitrite, UA NEG     Leukocytes, UA Negative     KUB- question stone in bladder- difficult to visualize due to stool Theodoro Parma, FNP      Assessment & Plan:   1. Blood in urine   2. Nephrolithiasis    Force fluids If pain develops again to ER or back to office Will schedule CT of abdomen if hematuria persists  Mary-Margaret Hassell Done, FNP

## 2013-05-04 LAB — URINE CULTURE: Organism ID, Bacteria: NO GROWTH

## 2013-05-06 ENCOUNTER — Telehealth: Payer: Self-pay | Admitting: Family Medicine

## 2013-05-06 ENCOUNTER — Encounter: Payer: Self-pay | Admitting: Nurse Practitioner

## 2013-05-06 NOTE — Telephone Encounter (Signed)
Message copied by Waverly Ferrari on Fri May 06, 2013 11:04 AM ------      Message from: Chevis Pretty      Created: Thu May 05, 2013  8:06 AM       Urine culture was negative ------

## 2013-05-09 ENCOUNTER — Other Ambulatory Visit: Payer: Self-pay | Admitting: *Deleted

## 2013-05-09 MED ORDER — LOSARTAN POTASSIUM 50 MG PO TABS: ORAL_TABLET | ORAL | Status: DC

## 2013-05-16 ENCOUNTER — Telehealth: Payer: Self-pay | Admitting: Nurse Practitioner

## 2013-05-16 ENCOUNTER — Ambulatory Visit: Payer: 59 | Admitting: Nurse Practitioner

## 2013-05-16 NOTE — Telephone Encounter (Signed)
Patient aware mmm has no available appts. I offered patient to see another provider but he only wanted to see mmm. appt goven for tomorrow

## 2013-05-17 ENCOUNTER — Ambulatory Visit (INDEPENDENT_AMBULATORY_CARE_PROVIDER_SITE_OTHER): Payer: 59 | Admitting: Nurse Practitioner

## 2013-05-17 ENCOUNTER — Encounter: Payer: Self-pay | Admitting: Nurse Practitioner

## 2013-05-17 VITALS — BP 147/90 | HR 83 | Temp 98.3°F | Ht 72.0 in | Wt 262.0 lb

## 2013-05-17 DIAGNOSIS — J0100 Acute maxillary sinusitis, unspecified: Secondary | ICD-10-CM

## 2013-05-17 DIAGNOSIS — J209 Acute bronchitis, unspecified: Secondary | ICD-10-CM

## 2013-05-17 MED ORDER — LEVOFLOXACIN 500 MG PO TABS: 500.0000 mg | ORAL_TABLET | Freq: Every day | ORAL | Status: DC

## 2013-05-17 NOTE — Patient Instructions (Signed)

## 2013-05-17 NOTE — Progress Notes (Signed)
   Subjective:    Patient ID: David Ortega, male    DOB: 08-Oct-1960, 53 y.o.   MRN: [id number]  HPI Patient i c/o cough and congestion- that started several days ago- has chills at night    Review of Systems  Constitutional: Positive for fever and chills. Negative for appetite change.  HENT: Positive for congestion, rhinorrhea and sinus pressure. Negative for sore throat, trouble swallowing and voice change.   Respiratory: Positive for cough (productive- greenish).   Cardiovascular: Negative.   Gastrointestinal: Negative.   Musculoskeletal: Negative.   Psychiatric/Behavioral: Negative.   All other systems reviewed and are negative.      Objective:   Physical Exam  Constitutional: He is oriented to person, place, and time. He appears well-developed and well-nourished.  HENT:  Right Ear: Hearing, tympanic membrane, external ear and ear canal normal.  Left Ear: Hearing, tympanic membrane, external ear and ear canal normal.  Nose: Mucosal edema and rhinorrhea present. Right sinus exhibits maxillary sinus tenderness. Right sinus exhibits no frontal sinus tenderness. Left sinus exhibits maxillary sinus tenderness. Left sinus exhibits no frontal sinus tenderness.  Mouth/Throat: Uvula is midline, oropharynx is clear and moist and mucous membranes are normal.  Eyes: Pupils are equal, round, and reactive to light.  Neck: Normal range of motion. Neck supple.  Cardiovascular: Normal rate and normal heart sounds.   Pulmonary/Chest: Effort normal and breath sounds normal.  Deep tight cough with occassional scattered rhonchi  Lymphadenopathy:    He has no cervical adenopathy.  Neurological: He is alert and oriented to person, place, and time.  Skin: Skin is warm and dry.  Psychiatric: He has a normal mood and affect. His behavior is normal. Judgment and thought content normal.    BP 147/90  Pulse 83  Temp(Src) 98.3 F (36.8 C) (Oral)  Ht 6' (1.829 m)  Wt 262 lb (118.842 kg)  BMI  35.53 kg/m2'      Assessment & Plan:   1. Sinusitis, acute, maxillary   2. Acute bronchitis    Meds ordered this encounter  Medications  . levofloxacin (LEVAQUIN) 500 MG tablet    Sig: Take 1 tablet (500 mg total) by mouth daily.    Dispense:  7 tablet    Refill:  0    Order Specific Question:  Supervising Provider    Answer:  Chipper Herb [[id number]]   1. Take meds as prescribed 2. Use a cool mist humidifier especially during the winter months and when heat has been humid. 3. Use saline nose sprays frequently 4. Saline irrigations of the nose can be very helpful if done frequently.  * 4X daily for 1 week*  * Use of a nettie pot can be helpful with this. Follow directions with this* 5. Drink plenty of fluids 6. Keep thermostat turn down low 7.For any cough or congestion  Use plain Mucinex- regular strength or max strength is fine   * Children- consult with Pharmacist for dosing 8. For fever or aces or pains- take tylenol or ibuprofen appropriate for age and weight.  * for fevers greater than 101 orally you may alternate ibuprofen and tylenol every  3 hours.   Mary-Margaret Hassell Done, FNP

## 2013-05-23 ENCOUNTER — Other Ambulatory Visit: Payer: Self-pay | Admitting: Nurse Practitioner

## 2013-05-23 MED ORDER — TAMSULOSIN HCL 0.4 MG PO CAPS: 0.4000 mg | ORAL_CAPSULE | Freq: Every day | ORAL | Status: DC

## 2013-06-27 ENCOUNTER — Other Ambulatory Visit: Payer: Self-pay

## 2013-06-27 DIAGNOSIS — J45909 Unspecified asthma, uncomplicated: Secondary | ICD-10-CM

## 2013-06-27 DIAGNOSIS — J209 Acute bronchitis, unspecified: Secondary | ICD-10-CM

## 2013-06-27 MED ORDER — ALBUTEROL SULFATE HFA 108 (90 BASE) MCG/ACT IN AERS: 2.0000 | INHALATION_SPRAY | Freq: Four times a day (QID) | RESPIRATORY_TRACT | Status: DC | PRN

## 2013-07-07 ENCOUNTER — Other Ambulatory Visit: Payer: Self-pay | Admitting: Nurse Practitioner

## 2013-08-01 ENCOUNTER — Other Ambulatory Visit: Payer: Self-pay | Admitting: Nurse Practitioner

## 2013-08-01 MED ORDER — BUDESONIDE-FORMOTEROL FUMARATE 160-4.5 MCG/ACT IN AERO: INHALATION_SPRAY | RESPIRATORY_TRACT | Status: DC

## 2013-08-03 ENCOUNTER — Other Ambulatory Visit: Payer: Self-pay | Admitting: Nurse Practitioner

## 2013-08-04 ENCOUNTER — Other Ambulatory Visit: Payer: Self-pay | Admitting: Nurse Practitioner

## 2013-08-04 MED ORDER — LOSARTAN POTASSIUM 50 MG PO TABS: ORAL_TABLET | ORAL | Status: DC

## 2013-11-03 ENCOUNTER — Other Ambulatory Visit: Payer: Self-pay | Admitting: Nurse Practitioner

## 2013-11-27 DIAGNOSIS — N200 Calculus of kidney: Secondary | ICD-10-CM

## 2013-11-27 HISTORY — DX: Calculus of kidney: N20.0

## 2013-12-04 ENCOUNTER — Other Ambulatory Visit: Payer: Self-pay | Admitting: Nurse Practitioner

## 2013-12-09 ENCOUNTER — Other Ambulatory Visit: Payer: Self-pay | Admitting: *Deleted

## 2013-12-09 MED ORDER — OMEPRAZOLE 40 MG PO CPDR: DELAYED_RELEASE_CAPSULE | ORAL | Status: DC

## 2013-12-20 ENCOUNTER — Other Ambulatory Visit: Payer: Self-pay | Admitting: Nurse Practitioner

## 2013-12-20 MED ORDER — OMEPRAZOLE 40 MG PO CPDR: DELAYED_RELEASE_CAPSULE | ORAL | Status: DC

## 2013-12-26 ENCOUNTER — Other Ambulatory Visit: Payer: Self-pay | Admitting: Nurse Practitioner

## 2013-12-26 MED ORDER — LOSARTAN POTASSIUM 50 MG PO TABS: ORAL_TABLET | ORAL | Status: DC

## 2014-02-10 ENCOUNTER — Encounter: Payer: Self-pay | Admitting: Family Medicine

## 2014-02-10 ENCOUNTER — Ambulatory Visit (INDEPENDENT_AMBULATORY_CARE_PROVIDER_SITE_OTHER): Payer: 59 | Admitting: Family Medicine

## 2014-02-10 VITALS — BP 148/86 | HR 82 | Temp 97.5°F | Ht 72.0 in | Wt 267.0 lb

## 2014-02-10 DIAGNOSIS — K529 Noninfective gastroenteritis and colitis, unspecified: Secondary | ICD-10-CM

## 2014-02-10 MED ORDER — ONDANSETRON 8 MG PO TBDP: 8.0000 mg | ORAL_TABLET | Freq: Three times a day (TID) | ORAL | Status: DC | PRN

## 2014-02-10 NOTE — Progress Notes (Signed)
   Subjective:    Patient ID: David Ortega, male    DOB: 03-12-1960, 54 y.o.   MRN: [id number]  HPI Patient is here for c/o nausea and vomiting since this am.  He is feeling washed out.  Review of Systems  Constitutional: Negative for fever.  HENT: Negative for ear pain.   Eyes: Negative for discharge.  Respiratory: Negative for cough.   Cardiovascular: Negative for chest pain.  Gastrointestinal: Negative for abdominal distention.  Endocrine: Negative for polyuria.  Genitourinary: Negative for difficulty urinating.  Musculoskeletal: Negative for gait problem and neck pain.  Skin: Negative for color change and rash.  Neurological: Negative for speech difficulty and headaches.  Psychiatric/Behavioral: Negative for agitation.       Objective:    BP 148/86 mmHg  Pulse 82  Temp(Src) 97.5 F (36.4 C) (Oral)  Ht 6' (1.829 m)  Wt 267 lb (121.11 kg)  BMI 36.20 kg/m2 Physical Exam  Constitutional: He is oriented to person, place, and time. He appears well-developed and well-nourished.  HENT:  Head: Normocephalic and atraumatic.  Mouth/Throat: Oropharynx is clear and moist.  Eyes: Pupils are equal, round, and reactive to light.  Neck: Normal range of motion. Neck supple.  Cardiovascular: Normal rate and regular rhythm.   No murmur heard. Pulmonary/Chest: Effort normal and breath sounds normal.  Abdominal: Soft. Bowel sounds are normal. There is no tenderness.  Neurological: He is alert and oriented to person, place, and time.  Skin: Skin is warm and dry.  Psychiatric: He has a normal mood and affect.          Assessment & Plan:     ICD-9-CM ICD-10-CM   1. Noninfectious gastroenteritis, unspecified 558.9 K52.9 ondansetron (ZOFRAN ODT) 8 MG disintegrating tablet   Push po fluids, rest, tylenol and motrin otc prn as directed for fever, arthralgias, and myalgias.  Follow up prn if sx's continue or persist.  No Follow-up on file.  Lysbeth Penner FNP

## 2014-02-20 ENCOUNTER — Ambulatory Visit (INDEPENDENT_AMBULATORY_CARE_PROVIDER_SITE_OTHER): Payer: 59 | Admitting: Nurse Practitioner

## 2014-02-20 ENCOUNTER — Encounter: Payer: Self-pay | Admitting: Nurse Practitioner

## 2014-02-20 ENCOUNTER — Telehealth: Payer: Self-pay | Admitting: Nurse Practitioner

## 2014-02-20 VITALS — BP 134/82 | HR 81 | Temp 98.4°F | Ht 72.0 in | Wt 268.0 lb

## 2014-02-20 DIAGNOSIS — H6593 Unspecified nonsuppurative otitis media, bilateral: Secondary | ICD-10-CM

## 2014-02-20 DIAGNOSIS — J0100 Acute maxillary sinusitis, unspecified: Secondary | ICD-10-CM

## 2014-02-20 MED ORDER — AMOXICILLIN 875 MG PO TABS: 875.0000 mg | ORAL_TABLET | Freq: Two times a day (BID) | ORAL | Status: DC

## 2014-02-20 MED ORDER — FLUTICASONE PROPIONATE 50 MCG/ACT NA SUSP: 2.0000 | Freq: Every day | NASAL | Status: DC

## 2014-02-20 NOTE — Telephone Encounter (Signed)
appt made

## 2014-02-20 NOTE — Progress Notes (Signed)
Subjective:    Patient ID: David Ortega, male    DOB: Jul 05, 1960, 54 y.o.   MRN: [id number]  HPI Patient in today c/o sinus pressure, ear stopped up- mild left facial swelling- Started yesterday- no fever- no cough- moderate congestion. No OTC meds    Review of Systems  Constitutional: Negative for fever and chills.  HENT: Positive for congestion, ear pain, facial swelling, rhinorrhea and sinus pressure. Negative for sore throat, trouble swallowing and voice change.   Respiratory: Positive for cough (occassional).   Gastrointestinal: Negative.   Genitourinary: Negative.   Neurological: Negative.   Psychiatric/Behavioral: Negative.   All other systems reviewed and are negative.      Objective:   Physical Exam  Constitutional: He is oriented to person, place, and time. He appears well-developed and well-nourished. No distress.  HENT:  Right Ear: Hearing, external ear and ear canal normal. A middle ear effusion is present.  Left Ear: Hearing, external ear and ear canal normal. A middle ear effusion is present.  Nose: Mucosal edema and rhinorrhea present. Right sinus exhibits maxillary sinus tenderness. Right sinus exhibits no frontal sinus tenderness. Left sinus exhibits maxillary sinus tenderness. Left sinus exhibits no frontal sinus tenderness.  Mouth/Throat: Uvula is midline, oropharynx is clear and moist and mucous membranes are normal.  Eyes: Pupils are equal, round, and reactive to light.  Neck: Normal range of motion. Neck supple.  Cardiovascular: Normal rate, regular rhythm and normal heart sounds.   Pulmonary/Chest: Effort normal and breath sounds normal.  Neurological: He is alert and oriented to person, place, and time.  Skin: Skin is warm and dry.  Psychiatric: He has a normal mood and affect. His behavior is normal. Judgment and thought content normal.    BP 134/82 mmHg  Pulse 81  Temp(Src) 98.4 F (36.9 C) (Oral)  Ht 6' (1.829 m)  Wt 268 lb (121.564 kg)   BMI 36.34 kg/m2       Assessment & Plan:   1. Acute maxillary sinusitis, recurrence not specified   2. Middle ear effusion, bilateral    Meds ordered this encounter  Medications  . amoxicillin (AMOXIL) 875 MG tablet    Sig: Take 1 tablet (875 mg total) by mouth 2 (two) times daily.    Dispense:  20 tablet    Refill:  0    Order Specific Question:  Supervising Provider    Answer:  Chipper Herb [[id number]]  . fluticasone (FLONASE) 50 MCG/ACT nasal spray    Sig: Place 2 sprays into both nostrils daily.    Dispense:  16 g    Refill:  6    Order Specific Question:  Supervising Provider    Answer:  Chipper Herb [[id number]]   1. Take meds as prescribed 2. Use a cool mist humidifier especially during the winter months and when heat has been humid. 3. Use saline nose sprays frequently 4. Saline irrigations of the nose can be very helpful if done frequently.  * 4X daily for 1 week*  * Use of a nettie pot can be helpful with this. Follow directions with this* 5. Drink plenty of fluids 6. Keep thermostat turn down low 7.For any cough or congestion  Use plain Mucinex- regular strength or max strength is fine- no decongestants   * Children- consult with Pharmacist for dosing 8. For fever or aces or pains- take tylenol or ibuprofen appropriate for age and weight.  * for fevers greater than 101 orally you may alternate ibuprofen  and tylenol every  3 hours.   Mary-Margaret Hassell Done, FNP

## 2014-02-20 NOTE — Patient Instructions (Signed)
1. Take meds as prescribed 2. Use a cool mist humidifier especially during the winter months and when heat has been humid. 3. Use saline nose sprays frequently 4. Saline irrigations of the nose can be very helpful if done frequently.  * 4X daily for 1 week*  * Use of a nettie pot can be helpful with this. Follow directions with this* 5. Drink plenty of fluids 6. Keep thermostat turn down low 7.For any cough or congestion  Use plain Mucinex- regular strength or max strength is fine- no decongestants   * Children- consult with Pharmacist for dosing 8. For fever or aces or pains- take tylenol or ibuprofen appropriate for age and weight.  * for fevers greater than 101 orally you may alternate ibuprofen and tylenol every  3 hours.

## 2014-03-20 ENCOUNTER — Other Ambulatory Visit: Payer: Self-pay | Admitting: Nurse Practitioner

## 2014-04-24 ENCOUNTER — Other Ambulatory Visit: Payer: Self-pay | Admitting: Nurse Practitioner

## 2014-04-25 ENCOUNTER — Other Ambulatory Visit: Payer: Self-pay | Admitting: Nurse Practitioner

## 2014-07-26 ENCOUNTER — Telehealth: Payer: Self-pay | Admitting: Nurse Practitioner

## 2014-07-26 NOTE — Telephone Encounter (Signed)
Stp's wife and she states he has been upset lately because he has had a lot on his plate. His wife has had 4 back surgeries in the past 9 weeks and is having some problems at work. He starts crying and doesn't know why when you ask him. Offered multiple appt times but wife declined stating he has to work and he would only see MMM. Wife is going to [facility] after speaking with husband to see if he could get off work early or go in late possibly.

## 2014-08-01 ENCOUNTER — Ambulatory Visit (INDEPENDENT_AMBULATORY_CARE_PROVIDER_SITE_OTHER): Payer: 59 | Admitting: Nurse Practitioner

## 2014-08-01 ENCOUNTER — Encounter: Payer: Self-pay | Admitting: Nurse Practitioner

## 2014-08-01 ENCOUNTER — Other Ambulatory Visit: Payer: Self-pay | Admitting: Nurse Practitioner

## 2014-08-01 VITALS — BP 141/90 | HR 74 | Temp 97.8°F | Ht 72.0 in | Wt 283.0 lb

## 2014-08-01 DIAGNOSIS — F411 Generalized anxiety disorder: Secondary | ICD-10-CM

## 2014-08-01 DIAGNOSIS — F329 Major depressive disorder, single episode, unspecified: Secondary | ICD-10-CM | POA: Diagnosis not present

## 2014-08-01 DIAGNOSIS — F32A Depression, unspecified: Secondary | ICD-10-CM

## 2014-08-01 MED ORDER — TRIAMCINOLONE ACETONIDE 0.1 % EX CREA: 1.0000 "application " | TOPICAL_CREAM | Freq: Two times a day (BID) | CUTANEOUS | Status: DC

## 2014-08-01 MED ORDER — ALPRAZOLAM 0.25 MG PO TABS: 0.2500 mg | ORAL_TABLET | Freq: Two times a day (BID) | ORAL | Status: DC | PRN

## 2014-08-01 MED ORDER — ESCITALOPRAM OXALATE 10 MG PO TABS: 10.0000 mg | ORAL_TABLET | Freq: Every day | ORAL | Status: DC

## 2014-08-01 NOTE — Progress Notes (Signed)
   Subjective:    Patient ID: David Ortega, male    DOB: [birth year], 54 y.o.   MRN: [id number]  HPI  Patient in today to discuss stress and anxiety- His wife has had to have several surgeries in the last 2 months and has been in and out of the hospital. He is also under a lot stress at work- His boss is giving him a hard time about being out of work with his wife. He says that he feels depressed- Has never been on antidepressant before.   Review of Systems  Constitutional: Negative.   HENT: Negative.   Respiratory: Negative.   Cardiovascular: Negative.   Genitourinary: Negative.   Neurological: Negative.   Psychiatric/Behavioral: Positive for sleep disturbance and decreased concentration. Negative for suicidal ideas. The patient is nervous/anxious.   All other systems reviewed and are negative.      Objective:   Physical Exam  Constitutional: He is oriented to person, place, and time. He appears well-developed and well-nourished.  HENT:  Head: Normocephalic.  Right Ear: External ear normal.  Left Ear: External ear normal.  Nose: Nose normal.  Mouth/Throat: Oropharynx is clear and moist.  Eyes: EOM are normal. Pupils are equal, round, and reactive to light.  Neck: Normal range of motion. Neck supple. No JVD present. No thyromegaly present.  Cardiovascular: Normal rate, regular rhythm, normal heart sounds and intact distal pulses.  Exam reveals no gallop and no friction rub.   No murmur heard. Pulmonary/Chest: Effort normal and breath sounds normal. No respiratory distress. He has no wheezes. He has no rales. He exhibits no tenderness.  Abdominal: Soft. Bowel sounds are normal. He exhibits no mass. There is no tenderness.  Genitourinary: Prostate normal and penis normal.  Musculoskeletal: Normal range of motion. He exhibits no edema.  Lymphadenopathy:    He has no cervical adenopathy.  Neurological: He is alert and oriented to person, place, and time. No cranial nerve deficit.   Skin: Skin is warm and dry.  Psychiatric: He has a normal mood and affect. His behavior is normal. Judgment and thought content normal.    BP 141/90 mmHg  Pulse 74  Temp(Src) 97.8 F (36.6 C) (Oral)  Ht 6' (1.829 m)  Wt 283 lb (128.368 kg)  BMI 38.37 kg/m2       Assessment & Plan:  1. GAD (generalized anxiety disorder) Stress management - ALPRAZolam (XANAX) 0.25 MG tablet; Take 1 tablet (0.25 mg total) by mouth 2 (two) times daily as needed for anxiety.  Dispense: 20 tablet; Refill: 0  2. Depression Side effects of meds reviewed - escitalopram (LEXAPRO) 10 MG tablet; Take 1 tablet (10 mg total) by mouth daily.  Dispense: 30 tablet; Refill: Hooker, FNP

## 2014-08-01 NOTE — Patient Instructions (Signed)
Stress and Stress Management Stress is a normal reaction to life events. It is what you feel when life demands more than you are used to or more than you can handle. Some stress can be useful. For example, the stress reaction can help you catch the last bus of the day, study for a test, or meet a deadline at work. But stress that occurs too often or for too long can cause problems. It can affect your emotional health and interfere with relationships and normal daily activities. Too much stress can weaken your immune system and increase your risk for physical illness. If you already have a medical problem, stress can make it worse. CAUSES  All sorts of life events may cause stress. An event that causes stress for one person may not be stressful for another person. Major life events commonly cause stress. These may be positive or negative. Examples include losing your job, moving into a new home, getting married, having a baby, or losing a loved one. Less obvious life events may also cause stress, especially if they occur day after day or in combination. Examples include working long hours, driving in traffic, caring for children, being in debt, or being in a difficult relationship. SIGNS AND SYMPTOMS Stress may cause emotional symptoms including, the following:  Anxiety. This is feeling worried, afraid, on edge, overwhelmed, or out of control.  Anger. This is feeling irritated or impatient.  Depression. This is feeling sad, down, helpless, or guilty.  Difficulty focusing, remembering, or making decisions. Stress may cause physical symptoms, including the following:   Aches and pains. These may affect your head, neck, back, stomach, or other areas of your body.  Tight muscles or clenched jaw.  Low energy or trouble sleeping. Stress may cause unhealthy behaviors, including the following:   Eating to feel better (overeating) or skipping meals.  Sleeping too little, too much, or both.  Working  too much or putting off tasks (procrastination).  Smoking, drinking alcohol, or using drugs to feel better. DIAGNOSIS  Stress is diagnosed through an assessment by your health care provider. Your health care provider will ask questions about your symptoms and any stressful life events.Your health care provider will also ask about your medical history and may order blood tests or other tests. Certain medical conditions and medicine can cause physical symptoms similar to stress. Mental illness can cause emotional symptoms and unhealthy behaviors similar to stress. Your health care provider may refer you to a mental health professional for further evaluation.  TREATMENT  Stress management is the recommended treatment for stress.The goals of stress management are reducing stressful life events and coping with stress in healthy ways.  Techniques for reducing stressful life events include the following:  Stress identification. Self-monitor for stress and identify what causes stress for you. These skills may help you to avoid some stressful events.  Time management. Set your priorities, keep a calendar of events, and learn to say "no." These tools can help you avoid making too many commitments. Techniques for coping with stress include the following:  Rethinking the problem. Try to think realistically about stressful events rather than ignoring them or overreacting. Try to find the positives in a stressful situation rather than focusing on the negatives.  Exercise. Physical exercise can release both physical and emotional tension. The key is to find a form of exercise you enjoy and do it regularly.  Relaxation techniques. These relax the body and mind. Examples include yoga, meditation, tai chi, biofeedback, deep  breathing, progressive muscle relaxation, listening to music, being out in nature, journaling, and other hobbies. Again, the key is to find one or more that you enjoy and can do  regularly.  Healthy lifestyle. Eat a balanced diet, get plenty of sleep, and do not smoke. Avoid using alcohol or drugs to relax.  Strong support network. Spend time with family, friends, or other people you enjoy being around.Express your feelings and talk things over with someone you trust. Counseling or talktherapy with a mental health professional may be helpful if you are having difficulty managing stress on your own. Medicine is typically not recommended for the treatment of stress.Talk to your health care provider if you think you need medicine for symptoms of stress. HOME CARE INSTRUCTIONS  Keep all follow-up visits as directed by your health care provider.  Take all medicines as directed by your health care provider. SEEK MEDICAL CARE IF:  Your symptoms get worse or you start having new symptoms.  You feel overwhelmed by your problems and can no longer manage them on your own. SEEK IMMEDIATE MEDICAL CARE IF:  You feel like hurting yourself or someone else. Document Released: 07/09/2000 Document Revised: 05/30/2013 Document Reviewed: 09/07/2012 ExitCare Patient Information 2015 [employer]. This information is not intended to replace advice given to you by your health care provider. Make sure you discuss any questions you have with your health care provider.  

## 2014-08-03 NOTE — Telephone Encounter (Signed)
Patient seen on 7/5 with Ronnald Collum, FNP.

## 2014-08-20 ENCOUNTER — Other Ambulatory Visit: Payer: Self-pay | Admitting: Nurse Practitioner

## 2014-09-20 ENCOUNTER — Other Ambulatory Visit: Payer: Self-pay | Admitting: Family Medicine

## 2014-10-12 ENCOUNTER — Encounter: Payer: Self-pay | Admitting: Nurse Practitioner

## 2014-10-12 ENCOUNTER — Ambulatory Visit (INDEPENDENT_AMBULATORY_CARE_PROVIDER_SITE_OTHER): Payer: 59 | Admitting: Nurse Practitioner

## 2014-10-12 VITALS — BP 126/79 | HR 74 | Temp 97.7°F | Ht 72.0 in | Wt 272.0 lb

## 2014-10-12 DIAGNOSIS — E860 Dehydration: Secondary | ICD-10-CM | POA: Diagnosis not present

## 2014-10-12 NOTE — Patient Instructions (Signed)
Dehydration, Adult Dehydration is when you lose more fluids from the body than you take in. Vital organs like the kidneys, brain, and heart cannot function without a proper amount of fluids and salt. Any loss of fluids from the body can cause dehydration.  CAUSES   Vomiting.  Diarrhea.  Excessive sweating.  Excessive urine output.  Fever. SYMPTOMS  Mild dehydration  Thirst.  Dry lips.  Slightly dry mouth. Moderate dehydration  Very dry mouth.  Sunken eyes.  Skin does not bounce back quickly when lightly pinched and released.  Dark urine and decreased urine production.  Decreased tear production.  Headache. Severe dehydration  Very dry mouth.  Extreme thirst.  Rapid, weak pulse (more than 100 beats per minute at rest).  Cold hands and feet.  Not able to sweat in spite of heat and temperature.  Rapid breathing.  Blue lips.  Confusion and lethargy.  Difficulty being awakened.  Minimal urine production.  No tears. DIAGNOSIS  Your caregiver will diagnose dehydration based on your symptoms and your exam. Blood and urine tests will help confirm the diagnosis. The diagnostic evaluation should also identify the cause of dehydration. TREATMENT  Treatment of mild or moderate dehydration can often be done at home by increasing the amount of fluids that you drink. It is best to drink small amounts of fluid more often. Drinking too much at one time can make vomiting worse. Refer to the home care instructions below. Severe dehydration needs to be treated at the hospital where you will probably be given intravenous (IV) fluids that contain water and electrolytes. HOME CARE INSTRUCTIONS   Ask your caregiver about specific rehydration instructions.  Drink enough fluids to keep your urine clear or pale yellow.  Drink small amounts frequently if you have nausea and vomiting.  Eat as you normally do.  Avoid:  Foods or drinks high in sugar.  Carbonated  drinks.  Juice.  Extremely hot or cold fluids.  Drinks with caffeine.  Fatty, greasy foods.  Alcohol.  Tobacco.  Overeating.  Gelatin desserts.  Wash your hands well to avoid spreading bacteria and viruses.  Only take over-the-counter or prescription medicines for pain, discomfort, or fever as directed by your caregiver.  Ask your caregiver if you should continue all prescribed and over-the-counter medicines.  Keep all follow-up appointments with your caregiver. SEEK MEDICAL CARE IF:  You have abdominal pain and it increases or stays in one area (localizes).  You have a rash, stiff neck, or severe headache.  You are irritable, sleepy, or difficult to awaken.  You are weak, dizzy, or extremely thirsty. SEEK IMMEDIATE MEDICAL CARE IF:   You are unable to keep fluids down or you get worse despite treatment.  You have frequent episodes of vomiting or diarrhea.  You have blood or green matter (bile) in your vomit.  You have blood in your stool or your stool looks black and tarry.  You have not urinated in 6 to 8 hours, or you have only urinated a small amount of very dark urine.  You have a fever.  You faint. MAKE SURE YOU:   Understand these instructions.  Will watch your condition.  Will get help right away if you are not doing well or get worse. Document Released: 01/13/2005 Document Revised: 04/07/2011 Document Reviewed: 09/02/2010 ExitCare Patient Information 2015 [employer]. This information is not intended to replace advice given to you by your health care provider. Make sure you discuss any questions you have with your health care   provider.  

## 2014-10-12 NOTE — Progress Notes (Signed)
   Subjective:    Patient ID: David Ortega, male    DOB: [birth year], 54 y.o.   MRN: [id number]  HPI Patient was at work and said that he started sweating and felt dizzy- lasted aboout 1/2- 1 hour- resolved on it's own- has not had it to reoccur. Had bil hand cramping at same time. Feels better today but is tired.    Review of Systems  Constitutional: Negative.   HENT: Negative.   Respiratory: Negative.   Cardiovascular: Negative.   Gastrointestinal: Negative.   Genitourinary: Negative.   Neurological: Negative.   Psychiatric/Behavioral: Negative.   All other systems reviewed and are negative.      Objective:   Physical Exam  Constitutional: He is oriented to person, place, and time. He appears well-developed and well-nourished.  Cardiovascular: Normal rate, regular rhythm and normal heart sounds.   Pulmonary/Chest: Effort normal and breath sounds normal.  Neurological: He is alert and oriented to person, place, and time.  Skin: Skin is warm and dry.  Psychiatric: He has a normal mood and affect. His behavior is normal. Judgment and thought content normal.    BP 126/79 mmHg  Pulse 74  Temp(Src) 97.7 F (36.5 C) (Oral)  Ht 6' (1.829 m)  Wt 272 lb (123.378 kg)  BMI 36.88 kg/m2       Assessment & Plan:   1. Dehydration    Orders Placed This Encounter  Procedures  . CMP14+EGFR   Force fluids Rest RTO prn  Mary-Margaret Hassell Done, FNP

## 2014-10-13 LAB — CMP14+EGFR
ALK PHOS: 125 IU/L — AB (ref 39–117)
ALT: 30 IU/L (ref 0–44)
AST: 27 IU/L (ref 0–40)
Albumin/Globulin Ratio: 1.6 (ref 1.1–2.5)
Albumin: 4.2 g/dL (ref 3.5–5.5)
BILIRUBIN TOTAL: 0.6 mg/dL (ref 0.0–1.2)
BUN/Creatinine Ratio: 16 (ref 9–20)
BUN: 14 mg/dL (ref 6–24)
CHLORIDE: 102 mmol/L (ref 97–108)
CO2: 26 mmol/L (ref 18–29)
CREATININE: 0.89 mg/dL (ref 0.76–1.27)
Calcium: 9.4 mg/dL (ref 8.7–10.2)
GFR calc Af Amer: 111 mL/min/{1.73_m2} (ref >59)
GFR calc non Af Amer: 96 mL/min/{1.73_m2} (ref >59)
GLUCOSE: 98 mg/dL (ref 65–99)
Globulin, Total: 2.7 g/dL (ref 1.5–4.5)
Potassium: 4.8 mmol/L (ref 3.5–5.2)
Sodium: 143 mmol/L (ref 134–144)
Total Protein: 6.9 g/dL (ref 6.0–8.5)

## 2015-01-11 ENCOUNTER — Other Ambulatory Visit: Payer: Self-pay | Admitting: Nurse Practitioner

## 2015-01-11 MED ORDER — NYSTATIN 100000 UNIT/ML MT SUSP: 5.0000 mL | Freq: Four times a day (QID) | OROMUCOSAL | Status: DC

## 2015-02-02 ENCOUNTER — Other Ambulatory Visit: Payer: Self-pay | Admitting: Family Medicine

## 2015-02-02 ENCOUNTER — Other Ambulatory Visit: Payer: Self-pay | Admitting: Nurse Practitioner

## 2015-02-12 ENCOUNTER — Ambulatory Visit (INDEPENDENT_AMBULATORY_CARE_PROVIDER_SITE_OTHER): Payer: 59

## 2015-02-12 ENCOUNTER — Encounter: Payer: Self-pay | Admitting: Nurse Practitioner

## 2015-02-12 ENCOUNTER — Ambulatory Visit (INDEPENDENT_AMBULATORY_CARE_PROVIDER_SITE_OTHER): Payer: 59 | Admitting: Nurse Practitioner

## 2015-02-12 VITALS — BP 137/85 | HR 78 | Temp 97.6°F | Ht 72.0 in | Wt 277.0 lb

## 2015-02-12 DIAGNOSIS — Z1212 Encounter for screening for malignant neoplasm of rectum: Secondary | ICD-10-CM

## 2015-02-12 DIAGNOSIS — F329 Major depressive disorder, single episode, unspecified: Secondary | ICD-10-CM | POA: Diagnosis not present

## 2015-02-12 DIAGNOSIS — I10 Essential (primary) hypertension: Secondary | ICD-10-CM

## 2015-02-12 DIAGNOSIS — Z1159 Encounter for screening for other viral diseases: Secondary | ICD-10-CM | POA: Diagnosis not present

## 2015-02-12 DIAGNOSIS — G4733 Obstructive sleep apnea (adult) (pediatric): Secondary | ICD-10-CM

## 2015-02-12 DIAGNOSIS — E785 Hyperlipidemia, unspecified: Secondary | ICD-10-CM | POA: Diagnosis not present

## 2015-02-12 DIAGNOSIS — J4520 Mild intermittent asthma, uncomplicated: Secondary | ICD-10-CM

## 2015-02-12 DIAGNOSIS — F32A Depression, unspecified: Secondary | ICD-10-CM | POA: Insufficient documentation

## 2015-02-12 DIAGNOSIS — K219 Gastro-esophageal reflux disease without esophagitis: Secondary | ICD-10-CM

## 2015-02-12 DIAGNOSIS — Z6839 Body mass index (BMI) 39.0-39.9, adult: Secondary | ICD-10-CM | POA: Insufficient documentation

## 2015-02-12 DIAGNOSIS — Z125 Encounter for screening for malignant neoplasm of prostate: Secondary | ICD-10-CM

## 2015-02-12 DIAGNOSIS — Z6837 Body mass index (BMI) 37.0-37.9, adult: Secondary | ICD-10-CM | POA: Diagnosis not present

## 2015-02-12 MED ORDER — OMEPRAZOLE 40 MG PO CPDR: DELAYED_RELEASE_CAPSULE | ORAL | Status: DC

## 2015-02-12 MED ORDER — BUDESONIDE-FORMOTEROL FUMARATE 160-4.5 MCG/ACT IN AERO: 2.0000 | INHALATION_SPRAY | Freq: Two times a day (BID) | RESPIRATORY_TRACT | Status: DC

## 2015-02-12 MED ORDER — ESCITALOPRAM OXALATE 20 MG PO TABS: 20.0000 mg | ORAL_TABLET | Freq: Every day | ORAL | Status: DC

## 2015-02-12 MED ORDER — LOSARTAN POTASSIUM 50 MG PO TABS: ORAL_TABLET | ORAL | Status: DC

## 2015-02-12 NOTE — Patient Instructions (Signed)

## 2015-02-12 NOTE — Progress Notes (Addendum)
Subjective:    Patient ID: David Ortega, male    DOB: [birth year], 55 y.o.   MRN: [id number]   Patient here today for follow up of chronic medical problems.  Outpatient Encounter Prescriptions as of 02/12/2015  Medication Sig  . ALPRAZolam (XANAX) 0.25 MG tablet Take 1 tablet (0.25 mg total) by mouth 2 (two) times daily as needed for anxiety.  Marland Kitchen aspirin 325 MG tablet Take 325 mg by mouth daily.  Marland Kitchen escitalopram (LEXAPRO) 10 MG tablet TAKE ONE TABLET BY MOUTH ONCE DAILY  . fexofenadine (ALLEGRA) 180 MG tablet Take 180 mg by mouth daily.  Marland Kitchen losartan (COZAAR) 50 MG tablet TAKE ONE TABLET BY MOUTH ONCE DAILY **MUST  BE  SEEN  BEFORE  NEXT  REFILL**  . nystatin (MYCOSTATIN) 100000 UNIT/ML suspension Take 5 mLs (500,000 Units total) by mouth 4 (four) times daily.  Marland Kitchen omeprazole (PRILOSEC) 40 MG capsule TAKE ONE CAPSULE BY MOUTH ONCE DAILY  . ondansetron (ZOFRAN ODT) 8 MG disintegrating tablet Take 1 tablet (8 mg total) by mouth every 8 (eight) hours as needed for nausea or vomiting.  . SYMBICORT 160-4.5 MCG/ACT inhaler INHALE TWO PUFFS BY MOUTH TWICE DAILY  . triamcinolone cream (KENALOG) 0.1 % Apply 1 application topically 2 (two) times daily.  . VENTOLIN HFA 108 (90 Base) MCG/ACT inhaler INHALE TWO PUFFS BY MOUTH EVERY 6 HOURS AS NEEDED FOR WHEEZING OR SHORTNESS OF BREATH   No facility-administered encounter medications on file as of 02/12/2015.     Hypertension This is a chronic problem. The current episode started more than 1 year ago. The problem is controlled. Risk factors for coronary artery disease include male gender, obesity and dyslipidemia. Past treatments include angiotensin blockers. There are no compliance problems.  There is no history of CAD/MI or CVA.   Depression/GAD Currently on lexapro and is working well for him- he has no side effects from medication. Takes xanax on prn basis- sometime sneeds daily and other times can go a day or two without taking. GERD Takes   omperazole daily- keeps symptoms under control Asthma  On symbicort daily which really is helping. Has not needed ventolin in a few weeks.  * has history of sleep apnea- has cpap machine but has not used in years- needs a new sleep study.   Depression screen [facility] 2/9 02/12/2015 02/12/2015 10/12/2014 08/01/2014 08/01/2014  Decreased Interest 2 3 1 3  0  Down, Depressed, Hopeless 1 1 0 2 0  PHQ - 2 Score 3 4 1 5  0  Altered sleeping 3 2 - 2 -  Tired, decreased energy 2 3 - 3 -  Change in appetite 1 1 - 2 -  Feeling bad or failure about yourself  0 0 - 0 -  Trouble concentrating 1 0 - 1 -  Moving slowly or fidgety/restless 0 0 - 0 -  Suicidal thoughts 0 0 - 0 -  PHQ-9 Score 10 10 - 13 -  Difficult doing work/chores Somewhat difficult - - Very difficult -       Review of Systems  Constitutional: Negative.   HENT: Negative.   Respiratory: Negative.   Cardiovascular: Negative.   Gastrointestinal: Negative.   Genitourinary: Negative.   Musculoskeletal: Negative.   Neurological: Negative.   Psychiatric/Behavioral: Negative.   All other systems reviewed and are negative.      Objective:   Physical Exam  Constitutional: He is oriented to person, place, and time. He appears well-developed and well-nourished.  HENT:  Head: Normocephalic.  Right Ear: External ear normal.  Left Ear: External ear normal.  Nose: Nose normal.  Mouth/Throat: Oropharynx is clear and moist.  Eyes: EOM are normal. Pupils are equal, round, and reactive to light.  Neck: Normal range of motion. Neck supple. No JVD present. No thyromegaly present.  Cardiovascular: Normal rate, regular rhythm, normal heart sounds and intact distal pulses.  Exam reveals no gallop and no friction rub.   No murmur heard. Pulmonary/Chest: Effort normal and breath sounds normal. No respiratory distress. He has no wheezes. He has no rales. He exhibits no tenderness.  Abdominal: Soft. Bowel sounds are normal. He exhibits no mass. There is  no tenderness.  Genitourinary: Prostate normal and penis normal.  Musculoskeletal: Normal range of motion. He exhibits no edema.  Lymphadenopathy:    He has no cervical adenopathy.  Neurological: He is alert and oriented to person, place, and time. No cranial nerve deficit.  Skin: Skin is warm and dry.  Psychiatric: He has a normal mood and affect. His behavior is normal. Judgment and thought content normal.   BP 137/85 mmHg  Pulse 78  Temp(Src) 97.6 F (36.4 C) (Oral)  Ht 6' (1.829 m)  Wt 277 lb (125.646 kg)  BMI 37.56 kg/m2  Chest x ray- no cardiopulmonary disease noted-Preliminary reading by Ronnald Collum, FNP  [facility]  EKG- NSR- Mary-Margaret Hassell Done, FNP       Assessment & Plan:  1. Essential hypertension Do not add salt to diet - CMP14+EGFR - EKG 12-Lead - losartan (COZAAR) 50 MG tablet; TAKE ONE TABLET BY MOUTH ONCE DAILY **MUST  BE  SEEN  BEFORE  NEXT  REFILL**  Dispense: 30 tablet; Refill: 5  2. Asthma, chronic, mild intermittent, uncomplicated - DG Chest 2 View; Future - budesonide-formoterol (SYMBICORT) 160-4.5 MCG/ACT inhaler; Inhale 2 puffs into the lungs 2 (two) times daily.  Dispense: 10.2 g; Refill: 4  3. Gastroesophageal reflux disease without esophagitis Avoid spicy foods Do not eat 2 hours prior to bedtime - omeprazole (PRILOSEC) 40 MG capsule; TAKE ONE CAPSULE BY MOUTH ONCE DAILY  Dispense: 30 capsule; Refill: 5  4. Hyperlipidemia Low fat diet - Lipid panel  5. BMI 37.0-37.9, adult Discussed diet and exercise for person with BMI >25 Will recheck weight in 3-6 months   6. OSA (obstructive sleep apnea) Will schedule sleep study - Nocturnal polysomnography (NPSG); Future  7. Prostate cancer screening - PSA, total and free  8. Depression Stress management lexapro increased from 3m to 263mdaily - escitalopram (LEXAPRO) 20 MG tablet; Take 1 tablet (20 mg total) by mouth daily.  Dispense: 30 tablet; Refill: 5 - Testosterone,Free and  Total    Labs pending Health maintenance reviewed Diet and exercise encouraged Continue all meds Follow up  In 6 months   [id number]

## 2015-02-13 ENCOUNTER — Other Ambulatory Visit: Payer: Self-pay | Admitting: Nurse Practitioner

## 2015-02-13 LAB — CMP14+EGFR
A/G RATIO: 1.5 (ref 1.1–2.5)
ALBUMIN: 4 g/dL (ref 3.5–5.5)
ALT: 20 IU/L (ref 0–44)
AST: 17 IU/L (ref 0–40)
Alkaline Phosphatase: 126 IU/L — ABNORMAL HIGH (ref 39–117)
BUN / CREAT RATIO: 13 (ref 9–20)
BUN: 12 mg/dL (ref 6–24)
Bilirubin Total: 0.6 mg/dL (ref 0.0–1.2)
CALCIUM: 9.3 mg/dL (ref 8.7–10.2)
CO2: 22 mmol/L (ref 18–29)
Chloride: 103 mmol/L (ref 96–106)
Creatinine, Ser: 0.9 mg/dL (ref 0.76–1.27)
GFR, EST AFRICAN AMERICAN: 111 mL/min/{1.73_m2} (ref >59)
GFR, EST NON AFRICAN AMERICAN: 96 mL/min/{1.73_m2} (ref >59)
GLOBULIN, TOTAL: 2.7 g/dL (ref 1.5–4.5)
Glucose: 108 mg/dL — ABNORMAL HIGH (ref 65–99)
POTASSIUM: 4.7 mmol/L (ref 3.5–5.2)
SODIUM: 142 mmol/L (ref 134–144)
TOTAL PROTEIN: 6.7 g/dL (ref 6.0–8.5)

## 2015-02-13 LAB — LIPID PANEL
CHOL/HDL RATIO: 4.3 ratio (ref 0.0–5.0)
Cholesterol, Total: 238 mg/dL — ABNORMAL HIGH (ref 100–199)
HDL: 55 mg/dL (ref >39)
LDL CALC: 148 mg/dL — AB (ref 0–99)
Triglycerides: 176 mg/dL — ABNORMAL HIGH (ref 0–149)
VLDL Cholesterol Cal: 35 mg/dL (ref 5–40)

## 2015-02-13 LAB — PSA, TOTAL AND FREE
PROSTATE SPECIFIC AG, SERUM: 0.7 ng/mL (ref 0.0–4.0)
PSA FREE PCT: 21.4 %
PSA, Free: 0.15 ng/mL

## 2015-02-13 LAB — TESTOSTERONE,FREE AND TOTAL
TESTOSTERONE FREE: 5.4 pg/mL — AB (ref 7.2–24.0)
TESTOSTERONE: 185 ng/dL — AB (ref 348–1197)

## 2015-02-13 LAB — HEPATITIS C ANTIBODY

## 2015-02-13 MED ORDER — ATORVASTATIN CALCIUM 40 MG PO TABS: 40.0000 mg | ORAL_TABLET | Freq: Every day | ORAL | Status: DC

## 2015-02-14 NOTE — Progress Notes (Signed)
Patient aware.

## 2015-02-28 ENCOUNTER — Telehealth: Payer: Self-pay | Admitting: Nurse Practitioner

## 2015-02-28 MED ORDER — TESTOSTERONE 50 MG/5GM (1%) TD GEL: TRANSDERMAL | Status: DC

## 2015-02-28 NOTE — Telephone Encounter (Signed)
Pt aware of testosterone level & agrees & is aware written Rx is at front desk ready for him to pickup

## 2015-02-28 NOTE — Telephone Encounter (Signed)
Testosterone level is low- needs testosterone- Rx will be here for you to pick up.

## 2015-02-28 NOTE — Telephone Encounter (Signed)
Pt aware of first labs that were received.

## 2015-03-01 ENCOUNTER — Other Ambulatory Visit: Payer: Self-pay | Admitting: Nurse Practitioner

## 2015-03-01 DIAGNOSIS — R0683 Snoring: Secondary | ICD-10-CM

## 2015-03-20 ENCOUNTER — Other Ambulatory Visit: Payer: Self-pay | Admitting: Nurse Practitioner

## 2015-03-20 ENCOUNTER — Telehealth: Payer: Self-pay

## 2015-03-20 DIAGNOSIS — E291 Testicular hypofunction: Secondary | ICD-10-CM

## 2015-03-20 NOTE — Telephone Encounter (Signed)
Can come in early one morning and have labs drawn- order is in computer

## 2015-03-20 NOTE — Telephone Encounter (Signed)
Insurance denied prior authorization for Androgel   Needs to have two Testosterone levels in between 7:00 Am and 10:00 AM that are abnormal   Patient only has one

## 2015-03-22 ENCOUNTER — Other Ambulatory Visit (INDEPENDENT_AMBULATORY_CARE_PROVIDER_SITE_OTHER): Payer: 59

## 2015-03-22 DIAGNOSIS — E291 Testicular hypofunction: Secondary | ICD-10-CM

## 2015-03-22 NOTE — Progress Notes (Signed)
Lab only 

## 2015-03-23 LAB — TESTOSTERONE,FREE AND TOTAL
TESTOSTERONE FREE: 4 pg/mL — AB (ref 7.2–24.0)
TESTOSTERONE: 166 ng/dL — AB (ref 348–1197)

## 2015-03-25 ENCOUNTER — Other Ambulatory Visit: Payer: Self-pay | Admitting: Nurse Practitioner

## 2015-03-25 MED ORDER — TESTOSTERONE 50 MG/5GM (1%) TD GEL: TRANSDERMAL | Status: DC

## 2015-03-26 ENCOUNTER — Telehealth: Payer: Self-pay | Admitting: Nurse Practitioner

## 2015-03-26 NOTE — Telephone Encounter (Signed)
Reviewed labs with pt. 

## 2015-05-19 ENCOUNTER — Other Ambulatory Visit: Payer: Self-pay | Admitting: Nurse Practitioner

## 2015-06-12 ENCOUNTER — Encounter: Payer: Self-pay | Admitting: Nurse Practitioner

## 2015-06-12 ENCOUNTER — Ambulatory Visit (INDEPENDENT_AMBULATORY_CARE_PROVIDER_SITE_OTHER): Payer: BLUE CROSS/BLUE SHIELD | Admitting: Nurse Practitioner

## 2015-06-12 VITALS — BP 131/79 | HR 77 | Temp 98.3°F | Ht 72.0 in | Wt 295.0 lb

## 2015-06-12 DIAGNOSIS — Z6837 Body mass index (BMI) 37.0-37.9, adult: Secondary | ICD-10-CM

## 2015-06-12 DIAGNOSIS — F329 Major depressive disorder, single episode, unspecified: Secondary | ICD-10-CM

## 2015-06-12 DIAGNOSIS — Z0000 Encounter for general adult medical examination without abnormal findings: Secondary | ICD-10-CM

## 2015-06-12 DIAGNOSIS — G4733 Obstructive sleep apnea (adult) (pediatric): Secondary | ICD-10-CM

## 2015-06-12 DIAGNOSIS — I10 Essential (primary) hypertension: Secondary | ICD-10-CM

## 2015-06-12 DIAGNOSIS — F32A Depression, unspecified: Secondary | ICD-10-CM

## 2015-06-12 DIAGNOSIS — F411 Generalized anxiety disorder: Secondary | ICD-10-CM

## 2015-06-12 DIAGNOSIS — E291 Testicular hypofunction: Secondary | ICD-10-CM

## 2015-06-12 DIAGNOSIS — E785 Hyperlipidemia, unspecified: Secondary | ICD-10-CM

## 2015-06-12 DIAGNOSIS — K219 Gastro-esophageal reflux disease without esophagitis: Secondary | ICD-10-CM

## 2015-06-12 DIAGNOSIS — J4520 Mild intermittent asthma, uncomplicated: Secondary | ICD-10-CM

## 2015-06-12 LAB — CMP14+EGFR
ALK PHOS: 145 IU/L — AB (ref 39–117)
ALT: 14 IU/L (ref 0–44)
AST: 14 IU/L (ref 0–40)
Albumin/Globulin Ratio: 1.6 (ref 1.2–2.2)
Albumin: 4.2 g/dL (ref 3.5–5.5)
BILIRUBIN TOTAL: 0.7 mg/dL (ref 0.0–1.2)
BUN/Creatinine Ratio: 12 (ref 9–20)
BUN: 12 mg/dL (ref 6–24)
CHLORIDE: 100 mmol/L (ref 96–106)
CO2: 23 mmol/L (ref 18–29)
Calcium: 9.5 mg/dL (ref 8.7–10.2)
Creatinine, Ser: 1.04 mg/dL (ref 0.76–1.27)
GFR calc Af Amer: 92 mL/min/{1.73_m2} (ref >59)
GFR calc non Af Amer: 80 mL/min/{1.73_m2} (ref >59)
GLUCOSE: 100 mg/dL — AB (ref 65–99)
Globulin, Total: 2.7 g/dL (ref 1.5–4.5)
Potassium: 5 mmol/L (ref 3.5–5.2)
Sodium: 140 mmol/L (ref 134–144)
Total Protein: 6.9 g/dL (ref 6.0–8.5)

## 2015-06-12 LAB — LIPID PANEL
CHOLESTEROL TOTAL: 164 mg/dL (ref 100–199)
Chol/HDL Ratio: 2.7 ratio units (ref 0.0–5.0)
HDL: 60 mg/dL (ref >39)
LDL Calculated: 81 mg/dL (ref 0–99)
TRIGLYCERIDES: 114 mg/dL (ref 0–149)
VLDL CHOLESTEROL CAL: 23 mg/dL (ref 5–40)

## 2015-06-12 LAB — URINALYSIS
BILIRUBIN UA: NEGATIVE
Glucose, UA: NEGATIVE
Ketones, UA: NEGATIVE
Leukocytes, UA: NEGATIVE
Nitrite, UA: NEGATIVE
PH UA: 7 (ref 5.0–7.5)
PROTEIN UA: NEGATIVE
Specific Gravity, UA: 1.02 (ref 1.005–1.030)
Urobilinogen, Ur: 1 mg/dL (ref 0.2–1.0)

## 2015-06-12 MED ORDER — BUPROPION HCL ER (XL) 150 MG PO TB24
150.0000 mg | ORAL_TABLET | Freq: Every day | ORAL | Status: DC
Start: 2015-06-12 — End: 2015-08-14

## 2015-06-12 MED ORDER — ATORVASTATIN CALCIUM 40 MG PO TABS: 40.0000 mg | ORAL_TABLET | Freq: Every day | ORAL | Status: DC

## 2015-06-12 MED ORDER — ALPRAZOLAM 0.25 MG PO TABS: 0.2500 mg | ORAL_TABLET | Freq: Two times a day (BID) | ORAL | Status: DC | PRN

## 2015-06-12 MED ORDER — BUDESONIDE-FORMOTEROL FUMARATE 160-4.5 MCG/ACT IN AERO: 2.0000 | INHALATION_SPRAY | Freq: Two times a day (BID) | RESPIRATORY_TRACT | Status: DC

## 2015-06-12 MED ORDER — TESTOSTERONE 50 MG/5GM (1%) TD GEL: TRANSDERMAL | Status: DC

## 2015-06-12 MED ORDER — ESCITALOPRAM OXALATE 20 MG PO TABS: 20.0000 mg | ORAL_TABLET | Freq: Every day | ORAL | Status: DC

## 2015-06-12 MED ORDER — OMEPRAZOLE 40 MG PO CPDR: DELAYED_RELEASE_CAPSULE | ORAL | Status: DC

## 2015-06-12 MED ORDER — LOSARTAN POTASSIUM 50 MG PO TABS: ORAL_TABLET | ORAL | Status: DC

## 2015-06-12 NOTE — Addendum Note (Signed)
Addended by: Chevis Pretty on: 06/12/2015 11:57 AM   Modules accepted: Miquel Dunn

## 2015-06-12 NOTE — Progress Notes (Addendum)
Subjective:    Patient ID: David Ortega, male    DOB: 04/09/1960, 55 y.o.   MRN: [id number]   Patient here today for CPE and  follow up of chronic medical problems.  Outpatient Encounter Prescriptions as of 06/12/2015  Medication Sig  . ALPRAZolam (XANAX) 0.25 MG tablet Take 1 tablet (0.25 mg total) by mouth 2 (two) times daily as needed for anxiety.  Marland Kitchen aspirin 325 MG tablet Take 325 mg by mouth daily.  Marland Kitchen atorvastatin (LIPITOR) 40 MG tablet TAKE ONE TABLET BY MOUTH ONCE DAILY  . budesonide-formoterol (SYMBICORT) 160-4.5 MCG/ACT inhaler Inhale 2 puffs into the lungs 2 (two) times daily.  Marland Kitchen escitalopram (LEXAPRO) 20 MG tablet Take 1 tablet (20 mg total) by mouth daily.  . fexofenadine (ALLEGRA) 180 MG tablet Take 180 mg by mouth daily.  Marland Kitchen losartan (COZAAR) 50 MG tablet TAKE ONE TABLET BY MOUTH ONCE DAILY **MUST  BE  SEEN  BEFORE  NEXT  REFILL**  . nystatin (MYCOSTATIN) 100000 UNIT/ML suspension Take 5 mLs (500,000 Units total) by mouth 4 (four) times daily. (Patient not taking: Reported on 02/12/2015)  . omeprazole (PRILOSEC) 40 MG capsule TAKE ONE CAPSULE BY MOUTH ONCE DAILY  . ondansetron (ZOFRAN ODT) 8 MG disintegrating tablet Take 1 tablet (8 mg total) by mouth every 8 (eight) hours as needed for nausea or vomiting.  Marland Kitchen testosterone (ANDROGEL) 50 MG/5GM (1%) GEL Apply 1 5g package to shoulder daily  . triamcinolone cream (KENALOG) 0.1 % Apply 1 application topically 2 (two) times daily. (Patient not taking: Reported on 02/12/2015)  . VENTOLIN HFA 108 (90 Base) MCG/ACT inhaler INHALE TWO PUFFS BY MOUTH EVERY 6 HOURS AS NEEDED FOR WHEEZING OR SHORTNESS OF BREATH   No facility-administered encounter medications on file as of 06/12/2015.     Hypertension This is a chronic problem. The current episode started more than 1 year ago. The problem is controlled. Risk factors for coronary artery disease include male gender, obesity and dyslipidemia. Past treatments include angiotensin blockers.  There are no compliance problems.  There is no history of CAD/MI or CVA.   Depression/GAD Currently on lexapro - his wife contacted me and told me she is concerned about him. He never feels like doing anything. Sleeps all day long and stays up at night. Has no motivation- will ot help do anything around the house. SHe does not feel that his lexapro is working. GERD Takes  omperazole daily- keeps symptoms under control Asthma  On symbicort daily which really is helping. Has not needed ventolin in a few weeks. Sleep apnea Recently got a new CPAP machine which is using nightly Hypogonadism Patient has new insurance and wants to try getting rx filled with new insurance.- c/o constant fatigue.    Depression screen [facility] 2/9 06/12/2015 02/12/2015 02/12/2015 10/12/2014 08/01/2014  Decreased Interest 3 2 3 1 3   Down, Depressed, Hopeless 1 1 1  0 2  PHQ - 2 Score 4 3 4 1 5   Altered sleeping 2 3 2  - 2  Tired, decreased energy 3 2 3  - 3  Change in appetite 3 1 1  - 2  Feeling bad or failure about yourself  1 0 0 - 0  Trouble concentrating 1 1 0 - 1  Moving slowly or fidgety/restless 0 0 0 - 0  Suicidal thoughts 0 0 0 - 0  PHQ-9 Score 14 10 10  - 13  Difficult doing work/chores - Somewhat difficult - - Very difficult  Review of Systems  Constitutional: Negative.   HENT: Negative.   Respiratory: Negative.   Cardiovascular: Negative.   Gastrointestinal: Negative.   Genitourinary: Negative.   Musculoskeletal: Negative.   Neurological: Negative.   Psychiatric/Behavioral: Negative.   All other systems reviewed and are negative.      Objective:   Physical Exam  Constitutional: He is oriented to person, place, and time. He appears well-developed and well-nourished.  HENT:  Head: Normocephalic.  Right Ear: External ear normal.  Left Ear: External ear normal.  Nose: Nose normal.  Mouth/Throat: Oropharynx is clear and moist.  Eyes: EOM are normal. Pupils are equal, round, and reactive  to light.  Neck: Normal range of motion. Neck supple. No JVD present. No thyromegaly present.  Cardiovascular: Normal rate, regular rhythm, normal heart sounds and intact distal pulses.  Exam reveals no gallop and no friction rub.   No murmur heard. Pulmonary/Chest: Effort normal and breath sounds normal. No respiratory distress. He has no wheezes. He has no rales. He exhibits no tenderness.  Abdominal: Soft. Bowel sounds are normal. He exhibits no mass. There is no tenderness.  Genitourinary: Prostate normal and penis normal.  Musculoskeletal: Normal range of motion. He exhibits no edema.  Lymphadenopathy:    He has no cervical adenopathy.  Neurological: He is alert and oriented to person, place, and time. No cranial nerve deficit.  Skin: Skin is warm and dry.  Psychiatric: He has a normal mood and affect. His behavior is normal. Judgment and thought content normal.   BP 131/79 mmHg  Pulse 77  Temp(Src) 98.3 F (36.8 C) (Oral)  Ht 6' (1.829 m)  Wt 295 lb (133.811 kg)  BMI 40.00 kg/m2      Assessment & Plan:  1. Essential hypertension Do not add salt to diet - CMP14+EGFR - losartan (COZAAR) 50 MG tablet; TAKE ONE TABLET BY MOUTH ONCE DAILY **MUST  BE  SEEN  BEFORE  NEXT  REFILL**  Dispense: 30 tablet; Refill: 5  2. OSA (obstructive sleep apnea) Continue CPAP  3. Gastroesophageal reflux disease without esophagitis Avoid spicy foods Do not eat 2 hours prior to bedtime - omeprazole (PRILOSEC) 40 MG capsule; TAKE ONE CAPSULE BY MOUTH ONCE DAILY  Dispense: 30 capsule; Refill: 5  4. Hyperlipidemia Low fat diet - Lipid panel - atorvastatin (LIPITOR) 40 MG tablet; Take 1 tablet (40 mg total) by mouth daily.  Dispense: 30 tablet; Refill: 5  5. Depression Stress management Added wellbutrin to lexapro - buPROPion (WELLBUTRIN XL) 150 MG 24 hr tablet; Take 1 tablet (150 mg total) by mouth daily.  Dispense: 30 tablet; Refill: 6 - escitalopram (LEXAPRO) 20 MG tablet; Take 1 tablet (20  mg total) by mouth daily.  Dispense: 30 tablet; Refill: 5  6. BMI 37.0-37.9, adult Discussed diet and exercise for person with BMI >25 Will recheck weight in 3-6 months   7. Asthma, chronic, mild intermittent, uncomplicated - budesonide-formoterol (SYMBICORT) 160-4.5 MCG/ACT inhaler; Inhale 2 puffs into the lungs 2 (two) times daily.  Dispense: 10.2 g; Refill: 4  8. GAD (generalized anxiety disorder) Stress management - ALPRAZolam (XANAX) 0.25 MG tablet; Take 1 tablet (0.25 mg total) by mouth 2 (two) times daily as needed for anxiety.  Dispense: 20 tablet; Refill: 0  9. Annual physical exam - Urinalysis  10. Hypogonadism in male - testosterone (ANDROGEL) 50 MG/5GM (1%) GEL; Apply 1 5g package to shoulder daily  Dispense: 30 Package; Refill: 5    Labs pending Health maintenance reviewed Diet and exercise encouraged  Continue all meds Follow up  In 3 months   [place], FNP

## 2015-06-12 NOTE — Patient Instructions (Signed)
Major Depressive Disorder Major depressive disorder is a mental illness. It also may be called clinical depression or unipolar depression. Major depressive disorder usually causes feelings of sadness, hopelessness, or helplessness. Some people with this disorder do not feel particularly sad but lose interest in doing things they used to enjoy (anhedonia). Major depressive disorder also can cause physical symptoms. It can interfere with work, school, relationships, and other normal everyday activities. The disorder varies in severity but is longer lasting and more serious than the sadness we all feel from time to time in our lives. Major depressive disorder often is triggered by stressful life events or major life changes. Examples of these triggers include divorce, loss of your job or home, a move, and the death of a family member or close friend. Sometimes this disorder occurs for no obvious reason at all. People who have family members with major depressive disorder or bipolar disorder are at higher risk for developing this disorder, with or without life stressors. Major depressive disorder can occur at any age. It may occur just once in your life (single episode major depressive disorder). It may occur multiple times (recurrent major depressive disorder). SYMPTOMS People with major depressive disorder have either anhedonia or depressed mood on nearly a daily basis for at least 2 weeks or longer. Symptoms of depressed mood include:  Feelings of sadness (blue or down in the dumps) or emptiness.  Feelings of hopelessness or helplessness.  Tearfulness or episodes of crying (may be observed by others).  Irritability (children and adolescents). In addition to depressed mood or anhedonia or both, people with this disorder have at least four of the following symptoms:  Difficulty sleeping or sleeping too much.   Significant change (increase or decrease) in appetite or weight.   Lack of energy or  motivation.  Feelings of guilt and worthlessness.   Difficulty concentrating, remembering, or making decisions.  Unusually slow movement (psychomotor retardation) or restlessness (as observed by others).   Recurrent wishes for death, recurrent thoughts of self-harm (suicide), or a suicide attempt. People with major depressive disorder commonly have persistent negative thoughts about themselves, other people, and the world. People with severe major depressive disorder may experiencedistorted beliefs or perceptions about the world (psychotic delusions). They also may see or hear things that are not real (psychotic hallucinations). DIAGNOSIS Major depressive disorder is diagnosed through an assessment by your health care provider. Your health care provider will ask aboutaspects of your daily life, such as mood,sleep, and appetite, to see if you have the diagnostic symptoms of major depressive disorder. Your health care provider may ask about your medical history and use of alcohol or drugs, including prescription medicines. Your health care provider also may do a physical exam and blood work. This is because certain medical conditions and the use of certain substances can cause major depressive disorder-like symptoms (secondary depression). Your health care provider also may refer you to a mental health specialist for further evaluation and treatment. TREATMENT It is important to recognize the symptoms of major depressive disorder and seek treatment. The following treatments can be prescribed for this disorder:   Medicine. Antidepressant medicines usually are prescribed. Antidepressant medicines are thought to correct chemical imbalances in the brain that are commonly associated with major depressive disorder. Other types of medicine may be added if the symptoms do not respond to antidepressant medicines alone or if psychotic delusions or hallucinations occur.  Talk therapy. Talk therapy can be  helpful in treating major depressive disorder by providing   support, education, and guidance. Certain types of talk therapy also can help with negative thinking (cognitive behavioral therapy) and with relationship issues that trigger this disorder (interpersonal therapy). A mental health specialist can help determine which treatment is best for you. Most people with major depressive disorder do well with a combination of medicine and talk therapy. Treatments involving electrical stimulation of the brain can be used in situations with extremely severe symptoms or when medicine and talk therapy do not work over time. These treatments include electroconvulsive therapy, transcranial magnetic stimulation, and vagal nerve stimulation.   This information is not intended to replace advice given to you by your health care provider. Make sure you discuss any questions you have with your health care provider.   Document Released: 05/10/2012 Document Revised: 02/03/2014 Document Reviewed: 05/10/2012 Elsevier Interactive Patient Education 2016 [employer]  

## 2015-06-21 ENCOUNTER — Encounter: Payer: Self-pay | Admitting: Nurse Practitioner

## 2015-06-28 ENCOUNTER — Telehealth: Payer: Self-pay

## 2015-06-28 NOTE — Telephone Encounter (Signed)
INsurance prior authorized Testosterone

## 2015-07-24 ENCOUNTER — Ambulatory Visit (INDEPENDENT_AMBULATORY_CARE_PROVIDER_SITE_OTHER): Payer: BLUE CROSS/BLUE SHIELD | Admitting: Nurse Practitioner

## 2015-07-24 ENCOUNTER — Encounter: Payer: Self-pay | Admitting: Nurse Practitioner

## 2015-07-24 VITALS — BP 145/93 | HR 78 | Temp 97.7°F | Ht 72.0 in | Wt 297.0 lb

## 2015-07-24 DIAGNOSIS — J0101 Acute recurrent maxillary sinusitis: Secondary | ICD-10-CM | POA: Diagnosis not present

## 2015-07-24 DIAGNOSIS — H6692 Otitis media, unspecified, left ear: Secondary | ICD-10-CM

## 2015-07-24 MED ORDER — CHLORPHEN-PE-ACETAMINOPHEN 4-10-325 MG PO TABS: 1.0000 | ORAL_TABLET | Freq: Four times a day (QID) | ORAL | Status: DC | PRN

## 2015-07-24 MED ORDER — AZITHROMYCIN 250 MG PO TABS: ORAL_TABLET | ORAL | Status: DC

## 2015-07-24 NOTE — Progress Notes (Signed)
   Subjective:    Patient ID: David Ortega, male    DOB: 16-Apr-1960, 55 y.o.   MRN: [id number]  HPI Patient comes in today c/o ear pain, sore throat and left facial apin- started Sunday and has gotten worse. Denies fever.    Review of Systems  Constitutional: Negative.   HENT: Positive for ear pain, sinus pressure, sore throat and trouble swallowing. Negative for congestion.   Respiratory: Negative.   Cardiovascular: Negative.   Genitourinary: Negative.   Neurological: Negative.   Psychiatric/Behavioral: Negative.   All other systems reviewed and are negative.      Objective:   Physical Exam  Constitutional: He is oriented to person, place, and time. He appears well-developed and well-nourished. No distress.  HENT:  Right Ear: Hearing, tympanic membrane, external ear and ear canal normal.  Left Ear: Hearing and external ear normal. Tympanic membrane is erythematous.  Nose: Mucosal edema and rhinorrhea present. Right sinus exhibits maxillary sinus tenderness. Left sinus exhibits maxillary sinus tenderness.  Mouth/Throat: Uvula is midline, oropharynx is clear and moist and mucous membranes are normal.  Cardiovascular: Normal rate, regular rhythm and normal heart sounds.   Pulmonary/Chest: Effort normal and breath sounds normal.  Neurological: He is alert and oriented to person, place, and time.  Skin: Skin is warm.  Psychiatric: He has a normal mood and affect. His behavior is normal. Judgment and thought content normal.   BP 145/93 mmHg  Pulse 78  Temp(Src) 97.7 F (36.5 C) (Oral)  Ht 6' (1.829 m)  Wt 297 lb (134.718 kg)  BMI 40.27 kg/m2        Assessment & Plan:   1. Recurrent acute otitis media of left ear, unspecified otitis media type   2. Acute recurrent maxillary sinusitis    Meds ordered this encounter  Medications  . azithromycin (ZITHROMAX Z-PAK) 250 MG tablet    Sig: As directed    Dispense:  1 each    Refill:  0    Order Specific Question:   Supervising Provider    Answer:  Chipper Herb [[id number]]  . Chlorphen-PE-Acetaminophen 4-10-325 MG TABS    Sig: Take 1 tablet by mouth every 6 (six) hours as needed.    Dispense:  20 tablet    Refill:  0    Order Specific Question:  Supervising Provider    Answer:  Chipper Herb [[id number]]   1. Take meds as prescribed 2. Use a cool mist humidifier especially during the winter months and when heat has been humid. 3. Use saline nose sprays frequently 4. Saline irrigations of the nose can be very helpful if done frequently.  * 4X daily for 1 week*  * Use of a nettie pot can be helpful with this. Follow directions with this* 5. Drink plenty of fluids 6. Keep thermostat turn down low 7.For any cough or congestion  Use plain Mucinex- regular strength or max strength is fine   * Children- consult with Pharmacist for dosing 8. For fever or aces or pains- take tylenol or ibuprofen appropriate for age and weight.  * for fevers greater than 101 orally you may alternate ibuprofen and tylenol every  3 hours.   Mary-Margaret Hassell Done, FNP

## 2015-07-24 NOTE — Patient Instructions (Signed)

## 2015-08-14 ENCOUNTER — Ambulatory Visit (INDEPENDENT_AMBULATORY_CARE_PROVIDER_SITE_OTHER): Payer: BLUE CROSS/BLUE SHIELD | Admitting: Nurse Practitioner

## 2015-08-14 ENCOUNTER — Encounter: Payer: Self-pay | Admitting: Nurse Practitioner

## 2015-08-14 VITALS — BP 134/79 | HR 71 | Temp 97.1°F | Ht 72.0 in | Wt 299.0 lb

## 2015-08-14 DIAGNOSIS — G4733 Obstructive sleep apnea (adult) (pediatric): Secondary | ICD-10-CM

## 2015-08-14 DIAGNOSIS — F329 Major depressive disorder, single episode, unspecified: Secondary | ICD-10-CM

## 2015-08-14 DIAGNOSIS — K219 Gastro-esophageal reflux disease without esophagitis: Secondary | ICD-10-CM

## 2015-08-14 DIAGNOSIS — I10 Essential (primary) hypertension: Secondary | ICD-10-CM

## 2015-08-14 DIAGNOSIS — J4520 Mild intermittent asthma, uncomplicated: Secondary | ICD-10-CM | POA: Diagnosis not present

## 2015-08-14 DIAGNOSIS — F32A Depression, unspecified: Secondary | ICD-10-CM

## 2015-08-14 DIAGNOSIS — Z6837 Body mass index (BMI) 37.0-37.9, adult: Secondary | ICD-10-CM

## 2015-08-14 DIAGNOSIS — E785 Hyperlipidemia, unspecified: Secondary | ICD-10-CM | POA: Diagnosis not present

## 2015-08-14 LAB — CMP14+EGFR
ALT: 20 IU/L (ref 0–44)
AST: 16 IU/L (ref 0–40)
Albumin/Globulin Ratio: 1.3 (ref 1.2–2.2)
Albumin: 3.8 g/dL (ref 3.5–5.5)
Alkaline Phosphatase: 130 IU/L — ABNORMAL HIGH (ref 39–117)
BUN/Creatinine Ratio: 10 (ref 9–20)
BUN: 11 mg/dL (ref 6–24)
Bilirubin Total: 0.5 mg/dL (ref 0.0–1.2)
CALCIUM: 9.1 mg/dL (ref 8.7–10.2)
CO2: 25 mmol/L (ref 18–29)
CREATININE: 1.11 mg/dL (ref 0.76–1.27)
Chloride: 100 mmol/L (ref 96–106)
GFR, EST AFRICAN AMERICAN: 85 mL/min/{1.73_m2} (ref >59)
GFR, EST NON AFRICAN AMERICAN: 74 mL/min/{1.73_m2} (ref >59)
GLUCOSE: 104 mg/dL — AB (ref 65–99)
Globulin, Total: 3 g/dL (ref 1.5–4.5)
Potassium: 3.9 mmol/L (ref 3.5–5.2)
Sodium: 140 mmol/L (ref 134–144)
TOTAL PROTEIN: 6.8 g/dL (ref 6.0–8.5)

## 2015-08-14 LAB — LIPID PANEL
CHOLESTEROL TOTAL: 242 mg/dL — AB (ref 100–199)
Chol/HDL Ratio: 4.2 ratio units (ref 0.0–5.0)
HDL: 57 mg/dL (ref >39)
LDL Calculated: 141 mg/dL — ABNORMAL HIGH (ref 0–99)
TRIGLYCERIDES: 220 mg/dL — AB (ref 0–149)
VLDL CHOLESTEROL CAL: 44 mg/dL — AB (ref 5–40)

## 2015-08-14 MED ORDER — BUDESONIDE-FORMOTEROL FUMARATE 160-4.5 MCG/ACT IN AERO: 2.0000 | INHALATION_SPRAY | Freq: Two times a day (BID) | RESPIRATORY_TRACT | Status: DC

## 2015-08-14 MED ORDER — ESCITALOPRAM OXALATE 20 MG PO TABS: 20.0000 mg | ORAL_TABLET | Freq: Every day | ORAL | Status: DC

## 2015-08-14 MED ORDER — BUPROPION HCL ER (XL) 150 MG PO TB24: 150.0000 mg | ORAL_TABLET | Freq: Every day | ORAL | Status: DC

## 2015-08-14 NOTE — Progress Notes (Signed)
Subjective:    Patient ID: David Ortega, male    DOB: 1960/08/22, 55 y.o.   MRN: [id number]   Patient here today for CPE and  follow up of chronic medical problems.  Outpatient Encounter Prescriptions as of 08/14/2015  Medication Sig  . ALPRAZolam (XANAX) 0.25 MG tablet Take 1 tablet (0.25 mg total) by mouth 2 (two) times daily as needed for anxiety.  Marland Kitchen aspirin 325 MG tablet Take 325 mg by mouth daily.  Marland Kitchen atorvastatin (LIPITOR) 40 MG tablet Take 1 tablet (40 mg total) by mouth daily.  . budesonide-formoterol (SYMBICORT) 160-4.5 MCG/ACT inhaler Inhale 2 puffs into the lungs 2 (two) times daily.  Marland Kitchen buPROPion (WELLBUTRIN XL) 150 MG 24 hr tablet Take 1 tablet (150 mg total) by mouth daily.  . Chlorphen-PE-Acetaminophen 4-10-325 MG TABS Take 1 tablet by mouth every 6 (six) hours as needed.  Marland Kitchen escitalopram (LEXAPRO) 20 MG tablet Take 1 tablet (20 mg total) by mouth daily.  . fexofenadine (ALLEGRA) 180 MG tablet Take 180 mg by mouth daily.  Marland Kitchen losartan (COZAAR) 50 MG tablet TAKE ONE TABLET BY MOUTH ONCE DAILY **MUST  BE  SEEN  BEFORE  NEXT  REFILL**  . omeprazole (PRILOSEC) 40 MG capsule TAKE ONE CAPSULE BY MOUTH ONCE DAILY  . ondansetron (ZOFRAN ODT) 8 MG disintegrating tablet Take 1 tablet (8 mg total) by mouth every 8 (eight) hours as needed for nausea or vomiting.  Marland Kitchen testosterone (ANDROGEL) 50 MG/5GM (1%) GEL Apply 1 5g package to shoulder daily  . triamcinolone cream (KENALOG) 0.1 % Apply 1 application topically 2 (two) times daily.  . VENTOLIN HFA 108 (90 Base) MCG/ACT inhaler INHALE TWO PUFFS BY MOUTH EVERY 6 HOURS AS NEEDED FOR WHEEZING OR SHORTNESS OF BREATH  . [DISCONTINUED] azithromycin (ZITHROMAX Z-PAK) 250 MG tablet As directed   No facility-administered encounter medications on file as of 08/14/2015.     Hypertension This is a chronic problem. The current episode started more than 1 year ago. The problem is controlled. Risk factors for coronary artery disease include male  gender, obesity and dyslipidemia. Past treatments include angiotensin blockers. There are no compliance problems.  There is no history of CAD/MI or CVA.   Depression/GAD Currently on lexapro -added wellbutrin at last visit and that has really seem ot have made a difference- he denies any depression- he still is not working and says he just enjoys doing what ever he wants to do. Does not know how long he can financillay continue to do this. GERD Takes  omperazole daily- keeps symptoms under control Asthma  On symbicort daily which really is helping. Has not needed ventolin in a few weeks. Sleep apnea Recently got a new CPAP machine which is using nightly Hypogonadism Patient has new insurance and wants to try getting rx filled with new insurance.- c/o constant fatigue.    Depression screen [facility] 2/9 08/14/2015 07/24/2015 06/12/2015 02/12/2015 02/12/2015  Decreased Interest 0 0 _0 Down, Depressed, Hopeless 0 0 _1 PHQ - 2 Score 0 0 _2 Altered sleeping - - _3 Tired, decreased energy - - _4 Change in appetite - - _5 Feeling bad or failure about yourself  - - 1 0 0  Trouble concentrating - - 1 1 0  Moving slowly or fidgety/restless - - 0 0 0  Suicidal thoughts - - 0 0 0  PHQ-9 Score - - 14 10 10  Difficult doing work/chores - - - Somewhat difficult -         Review of Systems  Constitutional: Negative.   HENT: Negative.   Respiratory: Negative.   Cardiovascular: Negative.   Gastrointestinal: Negative.   Genitourinary: Negative.   Musculoskeletal: Negative.   Neurological: Negative.   Psychiatric/Behavioral: Negative.   All other systems reviewed and are negative.      Objective:   Physical Exam  Constitutional: He is oriented to person, place, and time. He appears well-developed and well-nourished.  HENT:  Head: Normocephalic.  Right Ear: External ear normal.  Left Ear: External ear normal.  Nose: Nose normal.  Mouth/Throat: Oropharynx is clear and  moist.  Eyes: EOM are normal. Pupils are equal, round, and reactive to light.  Neck: Normal range of motion. Neck supple. No JVD present. No thyromegaly present.  Cardiovascular: Normal rate, regular rhythm, normal heart sounds and intact distal pulses.  Exam reveals no gallop and no friction rub.   No murmur heard. Pulmonary/Chest: Effort normal and breath sounds normal. No respiratory distress. He has no wheezes. He has no rales. He exhibits no tenderness.  Abdominal: Soft. Bowel sounds are normal. He exhibits no mass. There is no tenderness.  Genitourinary: Prostate normal and penis normal.  Musculoskeletal: Normal range of motion. He exhibits no edema.  Lymphadenopathy:    He has no cervical adenopathy.  Neurological: He is alert and oriented to person, place, and time. No cranial nerve deficit.  Skin: Skin is warm and dry.  Psychiatric: He has a normal mood and affect. His behavior is normal. Judgment and thought content normal.   BP 134/79 mmHg  Pulse 71  Temp(Src) 97.1 F (36.2 C) (Oral)  Ht 6' (1.829 m)  Wt 299 lb (135.626 kg)  BMI 40.54 kg/m2      Assessment & Plan:  1. Essential hypertension Do not add altto diet - CMP14+EGFR  2. OSA (obstructive sleep apnea) Wear CPAP  3. Gastroesophageal reflux disease without esophagitis Avoid spicy foods Do not eat 2 hours prior to bedtime  4. Hyperlipidemia Low fat diet - Lipid panel  5. Depression Stress management - buPROPion (WELLBUTRIN XL) 150 MG 24 hr tablet; Take 1 tablet (150 mg total) by mouth daily.  Dispense: 30 tablet; Refill: 6 - escitalopram (LEXAPRO) 20 MG tablet; Take 1 tablet (20 mg total) by mouth daily.  Dispense: 30 tablet; Refill: 5  6. BMI 37.0-37.9, adult Discussed diet and exercise for person with BMI >25 Will recheck weight in 3-6 months Discussed recent weight gain  7. Asthma, chronic, mild intermittent, uncomplicated - budesonide-formoterol (SYMBICORT) 160-4.5 MCG/ACT inhaler; Inhale 2 puffs  into the lungs 2 (two) times daily.  Dispense: 10.2 g; Refill: 4   encouraged to do hemoccult cards Labs pending Health maintenance reviewed Diet and exercise encouraged Continue all meds Follow up  In 6 month   [place], FNP

## 2015-08-14 NOTE — Patient Instructions (Signed)

## 2015-08-31 ENCOUNTER — Other Ambulatory Visit: Payer: Self-pay | Admitting: Nurse Practitioner

## 2015-10-04 ENCOUNTER — Ambulatory Visit (INDEPENDENT_AMBULATORY_CARE_PROVIDER_SITE_OTHER): Payer: BLUE CROSS/BLUE SHIELD | Admitting: Nurse Practitioner

## 2015-10-04 ENCOUNTER — Ambulatory Visit (INDEPENDENT_AMBULATORY_CARE_PROVIDER_SITE_OTHER): Payer: BLUE CROSS/BLUE SHIELD

## 2015-10-04 ENCOUNTER — Encounter: Payer: Self-pay | Admitting: Nurse Practitioner

## 2015-10-04 VITALS — BP 142/88 | HR 84 | Temp 98.2°F | Ht 72.0 in | Wt 302.0 lb

## 2015-10-04 DIAGNOSIS — R059 Cough, unspecified: Secondary | ICD-10-CM

## 2015-10-04 DIAGNOSIS — K219 Gastro-esophageal reflux disease without esophagitis: Secondary | ICD-10-CM | POA: Diagnosis not present

## 2015-10-04 DIAGNOSIS — J309 Allergic rhinitis, unspecified: Secondary | ICD-10-CM | POA: Diagnosis not present

## 2015-10-04 DIAGNOSIS — R05 Cough: Secondary | ICD-10-CM | POA: Diagnosis not present

## 2015-10-04 LAB — CBC WITH DIFFERENTIAL/PLATELET
BASOS ABS: 0 10*3/uL (ref 0.0–0.2)
BASOS: 0 %
EOS (ABSOLUTE): 0.4 10*3/uL (ref 0.0–0.4)
Eos: 4 %
Hematocrit: 40 % (ref 37.5–51.0)
Hemoglobin: 13.7 g/dL (ref 12.6–17.7)
IMMATURE GRANS (ABS): 0.1 10*3/uL (ref 0.0–0.1)
IMMATURE GRANULOCYTES: 1 %
LYMPHS: 24 %
Lymphocytes Absolute: 2.3 10*3/uL (ref 0.7–3.1)
MCH: 32.1 pg (ref 26.6–33.0)
MCHC: 34.3 g/dL (ref 31.5–35.7)
MCV: 94 fL (ref 79–97)
Monocytes Absolute: 0.5 10*3/uL (ref 0.1–0.9)
Monocytes: 6 %
NEUTROS PCT: 65 %
Neutrophils Absolute: 6.2 10*3/uL (ref 1.4–7.0)
PLATELETS: 255 10*3/uL (ref 150–379)
RBC: 4.27 x10E6/uL (ref 4.14–5.80)
RDW: 15.1 % (ref 12.3–15.4)
WBC: 9.5 10*3/uL (ref 3.4–10.8)

## 2015-10-04 MED ORDER — OMEPRAZOLE 40 MG PO CPDR: 40.0000 mg | DELAYED_RELEASE_CAPSULE | Freq: Two times a day (BID) | ORAL | 3 refills | Status: DC

## 2015-10-04 MED ORDER — FLUTICASONE PROPIONATE 50 MCG/ACT NA SUSP: 2.0000 | Freq: Every day | NASAL | 6 refills | Status: DC

## 2015-10-04 MED ORDER — BENZONATATE 100 MG PO CAPS: 100.0000 mg | ORAL_CAPSULE | Freq: Three times a day (TID) | ORAL | 0 refills | Status: DC | PRN

## 2015-10-04 NOTE — Patient Instructions (Signed)

## 2015-10-04 NOTE — Progress Notes (Signed)
   Subjective:    Patient ID: David Ortega, male    DOB: Sep 29, 1960, 55 y.o.   MRN: [id number]  HPI Patient comes in today c/o frequent sweating which has gotten worse. Has a constant dull headache in the back of his head. The sweating has been going on for awhile. Headache just started 2 weeks ago. denies caffeine abuse. He also say as that he has been coughing a lot that seems to be worse when he is lying down and occasionally he feels like he is gagging.  * takes equate allergy pill every morning.  Review of Systems  Constitutional: Negative.   HENT: Positive for congestion. Negative for sinus pressure and trouble swallowing.   Respiratory: Positive for cough and choking.   Cardiovascular: Negative.   Gastrointestinal: Negative.   Endocrine: Negative.   Genitourinary: Negative.   Neurological: Positive for headaches.  Psychiatric/Behavioral: Negative.   All other systems reviewed and are negative.      Objective:   Physical Exam  Constitutional: He is oriented to person, place, and time. He appears well-developed and well-nourished. No distress.  HENT:  Right Ear: External ear normal.  Left Ear: External ear normal.  Nose: Nose normal.  Mouth/Throat: Oropharynx is clear and moist.  Neck: Normal range of motion. Neck supple.  Cardiovascular: Normal rate, regular rhythm and normal heart sounds.   Pulmonary/Chest: Effort normal and breath sounds normal.  Abdominal: Soft. Bowel sounds are normal.  Lymphadenopathy:    He has no cervical adenopathy.  Neurological: He is alert and oriented to person, place, and time. He has normal reflexes.  Skin: Skin is warm.  Psychiatric: He has a normal mood and affect. His behavior is normal. Judgment and thought content normal.    BP (!) 142/88 (BP Location: Left Arm, Cuff Size: Large)   Pulse 84   Temp 98.2 F (36.8 C) (Oral)   Ht 6' (1.829 m)   Wt (!) 302 lb (137 kg)   BMI 40.96 kg/m    Chest x ray- no acute or chronic  cardiopulmonary abnormalities-Preliminary reading by Ronnald Collum, FNP  [facility]      Assessment & Plan:   1. Cough   2. Gastroesophageal reflux disease without esophagitis   3. Allergic rhinitis, unspecified allergic rhinitis type    Meds ordered this encounter  Medications  . benzonatate (TESSALON PERLES) 100 MG capsule    Sig: Take 1 capsule (100 mg total) by mouth 3 (three) times daily as needed for cough.    Dispense:  20 capsule    Refill:  0    Order Specific Question:   Supervising Provider    Answer:   VINCENT, CAROL L [[id number]]  . omeprazole (PRILOSEC) 40 MG capsule    Sig: Take 1 capsule (40 mg total) by mouth 2 (two) times daily.    Dispense:  60 capsule    Refill:  3    Order Specific Question:   Supervising Provider    Answer:   VINCENT, CAROL L [[id number]]  . fluticasone (FLONASE) 50 MCG/ACT nasal spray    Sig: Place 2 sprays into both nostrils daily.    Dispense:  16 g    Refill:  6    Order Specific Question:   Supervising Provider    Answer:   Eustaquio Maize [[id number]]   Force fluids Labs pending Will talk once all labs are back RTO prn  Mary-Margaret Hassell Done, FNP

## 2015-10-12 ENCOUNTER — Encounter: Payer: Self-pay | Admitting: Nurse Practitioner

## 2015-10-12 ENCOUNTER — Ambulatory Visit (INDEPENDENT_AMBULATORY_CARE_PROVIDER_SITE_OTHER): Payer: BLUE CROSS/BLUE SHIELD | Admitting: Nurse Practitioner

## 2015-10-12 VITALS — BP 137/86 | HR 78 | Temp 98.5°F | Ht 72.0 in | Wt 303.0 lb

## 2015-10-12 DIAGNOSIS — I10 Essential (primary) hypertension: Secondary | ICD-10-CM | POA: Diagnosis not present

## 2015-10-12 MED ORDER — LOSARTAN POTASSIUM 100 MG PO TABS: 100.0000 mg | ORAL_TABLET | Freq: Every day | ORAL | 3 refills | Status: DC

## 2015-10-12 NOTE — Progress Notes (Signed)
   Subjective:    Patient ID: ARON WOLFFE, male    DOB: 29-May-1960, 55 y.o.   MRN: [id number]  HPI  Patient went to eye doctor today and blood pressure was 200/100+- he had no complaints at the time. Is here now for recheck.   Review of Systems  Constitutional: Negative.   HENT: Negative.   Respiratory: Negative.   Cardiovascular: Negative.   Gastrointestinal: Negative.   Genitourinary: Negative.   Neurological: Negative.   Psychiatric/Behavioral: Negative.   All other systems reviewed and are negative.      Objective:   Physical Exam  Constitutional: He is oriented to person, place, and time. He appears well-developed and well-nourished. No distress.  Cardiovascular: Normal rate, regular rhythm and normal heart sounds.   Pulmonary/Chest: Effort normal and breath sounds normal.  Neurological: He is alert and oriented to person, place, and time.  Skin: Skin is warm.  Psychiatric: He has a normal mood and affect. His behavior is normal. Judgment and thought content normal.   BP 137/86 (BP Location: Left Arm, Patient Position: Sitting, Cuff Size: Large)   Pulse 78   Temp 98.5 F (36.9 C) (Oral)   Ht 6' (1.829 m)   Wt (!) 303 lb (137.4 kg)   BMI 41.09 kg/m       Assessment & Plan:   1. Essential hypertension    Increased losartan to 100mg  Do not add salt to diet encouraged to exercise  Mary-Margaret Hassell Done, FNP

## 2015-10-12 NOTE — Patient Instructions (Signed)
Hypertension Hypertension, commonly called high blood pressure, is when the force of blood pumping through your arteries is too strong. Your arteries are the blood vessels that carry blood from your heart throughout your body. A blood pressure reading consists of a higher number over a lower number, such as 110/72. The higher number (systolic) is the pressure inside your arteries when your heart pumps. The lower number (diastolic) is the pressure inside your arteries when your heart relaxes. Ideally you want your blood pressure below 120/80. Hypertension forces your heart to work harder to pump blood. Your arteries may become narrow or stiff. Having untreated or uncontrolled hypertension can cause heart attack, stroke, kidney disease, and other problems. RISK FACTORS Some risk factors for high blood pressure are controllable. Others are not.  Risk factors you cannot control include:   Race. You may be at higher risk if you are African American.  Age. Risk increases with age.  Gender. Men are at higher risk than women before age 45 years. After age 65, women are at higher risk than men. Risk factors you can control include:  Not getting enough exercise or physical activity.  Being overweight.  Getting too much fat, sugar, calories, or salt in your diet.  Drinking too much alcohol. SIGNS AND SYMPTOMS Hypertension does not usually cause signs or symptoms. Extremely high blood pressure (hypertensive crisis) may cause headache, anxiety, shortness of breath, and nosebleed. DIAGNOSIS To check if you have hypertension, your health care provider will measure your blood pressure while you are seated, with your arm held at the level of your heart. It should be measured at least twice using the same arm. Certain conditions can cause a difference in blood pressure between your right and left arms. A blood pressure reading that is higher than normal on one occasion does not mean that you need treatment. If  it is not clear whether you have high blood pressure, you may be asked to return on a different day to have your blood pressure checked again. Or, you may be asked to monitor your blood pressure at home for 1 or more weeks. TREATMENT Treating high blood pressure includes making lifestyle changes and possibly taking medicine. Living a healthy lifestyle can help lower high blood pressure. You may need to change some of your habits. Lifestyle changes may include:  Following the DASH diet. This diet is high in fruits, vegetables, and whole grains. It is low in salt, red meat, and added sugars.  Keep your sodium intake below 2,300 mg per day.  Getting at least 30-45 minutes of aerobic exercise at least 4 times per week.  Losing weight if necessary.  Not smoking.  Limiting alcoholic beverages.  Learning ways to reduce stress. Your health care provider may prescribe medicine if lifestyle changes are not enough to get your blood pressure under control, and if one of the following is true:  You are 18-59 years of age and your systolic blood pressure is above 140.  You are 60 years of age or older, and your systolic blood pressure is above 150.  Your diastolic blood pressure is above 90.  You have diabetes, and your systolic blood pressure is over 140 or your diastolic blood pressure is over 90.  You have kidney disease and your blood pressure is above 140/90.  You have heart disease and your blood pressure is above 140/90. Your personal target blood pressure may vary depending on your medical conditions, your age, and other factors. HOME CARE INSTRUCTIONS    Have your blood pressure rechecked as directed by your health care provider.   Take medicines only as directed by your health care provider. Follow the directions carefully. Blood pressure medicines must be taken as prescribed. The medicine does not work as well when you skip doses. Skipping doses also puts you at risk for  problems.  Do not smoke.   Monitor your blood pressure at home as directed by your health care provider. SEEK MEDICAL CARE IF:   You think you are having a reaction to medicines taken.  You have recurrent headaches or feel dizzy.  You have swelling in your ankles.  You have trouble with your vision. SEEK IMMEDIATE MEDICAL CARE IF:  You develop a severe headache or confusion.  You have unusual weakness, numbness, or feel faint.  You have severe chest or abdominal pain.  You vomit repeatedly.  You have trouble breathing. MAKE SURE YOU:   Understand these instructions.  Will watch your condition.  Will get help right away if you are not doing well or get worse.   This information is not intended to replace advice given to you by your health care provider. Make sure you discuss any questions you have with your health care provider.   Document Released: 01/13/2005 Document Revised: 05/30/2014 Document Reviewed: 11/05/2012 Elsevier Interactive Patient Education 2016 [employer] Inc[employer]  

## 2016-02-21 ENCOUNTER — Other Ambulatory Visit: Payer: Self-pay | Admitting: Nurse Practitioner

## 2016-02-21 DIAGNOSIS — K219 Gastro-esophageal reflux disease without esophagitis: Secondary | ICD-10-CM

## 2016-02-25 ENCOUNTER — Other Ambulatory Visit: Payer: Self-pay | Admitting: Nurse Practitioner

## 2016-02-25 MED ORDER — AZITHROMYCIN 250 MG PO TABS: ORAL_TABLET | ORAL | 0 refills | Status: DC

## 2016-03-28 ENCOUNTER — Other Ambulatory Visit: Payer: Self-pay | Admitting: Nurse Practitioner

## 2016-03-28 DIAGNOSIS — E785 Hyperlipidemia, unspecified: Secondary | ICD-10-CM

## 2016-04-10 ENCOUNTER — Other Ambulatory Visit: Payer: Self-pay | Admitting: Nurse Practitioner

## 2016-05-04 ENCOUNTER — Other Ambulatory Visit: Payer: Self-pay | Admitting: Nurse Practitioner

## 2016-05-04 DIAGNOSIS — E785 Hyperlipidemia, unspecified: Secondary | ICD-10-CM

## 2016-05-06 NOTE — Telephone Encounter (Signed)
Last refill without being seen 

## 2016-05-06 NOTE — Telephone Encounter (Signed)
LMVOM NTBS before next refill

## 2016-06-16 ENCOUNTER — Ambulatory Visit (INDEPENDENT_AMBULATORY_CARE_PROVIDER_SITE_OTHER): Payer: BLUE CROSS/BLUE SHIELD | Admitting: Nurse Practitioner

## 2016-06-16 ENCOUNTER — Other Ambulatory Visit: Payer: Self-pay | Admitting: Nurse Practitioner

## 2016-06-16 ENCOUNTER — Encounter: Payer: Self-pay | Admitting: Nurse Practitioner

## 2016-06-16 VITALS — BP 134/77 | HR 88 | Temp 98.4°F | Ht 73.0 in | Wt 299.0 lb

## 2016-06-16 DIAGNOSIS — Z125 Encounter for screening for malignant neoplasm of prostate: Secondary | ICD-10-CM

## 2016-06-16 DIAGNOSIS — G4733 Obstructive sleep apnea (adult) (pediatric): Secondary | ICD-10-CM

## 2016-06-16 DIAGNOSIS — F329 Major depressive disorder, single episode, unspecified: Secondary | ICD-10-CM

## 2016-06-16 DIAGNOSIS — Z0000 Encounter for general adult medical examination without abnormal findings: Secondary | ICD-10-CM | POA: Diagnosis not present

## 2016-06-16 DIAGNOSIS — E785 Hyperlipidemia, unspecified: Secondary | ICD-10-CM

## 2016-06-16 DIAGNOSIS — Z6839 Body mass index (BMI) 39.0-39.9, adult: Secondary | ICD-10-CM | POA: Diagnosis not present

## 2016-06-16 DIAGNOSIS — F3341 Major depressive disorder, recurrent, in partial remission: Secondary | ICD-10-CM

## 2016-06-16 DIAGNOSIS — F32A Depression, unspecified: Secondary | ICD-10-CM

## 2016-06-16 DIAGNOSIS — I10 Essential (primary) hypertension: Secondary | ICD-10-CM

## 2016-06-16 DIAGNOSIS — E291 Testicular hypofunction: Secondary | ICD-10-CM

## 2016-06-16 DIAGNOSIS — E782 Mixed hyperlipidemia: Secondary | ICD-10-CM

## 2016-06-16 DIAGNOSIS — Z1211 Encounter for screening for malignant neoplasm of colon: Secondary | ICD-10-CM

## 2016-06-16 DIAGNOSIS — K219 Gastro-esophageal reflux disease without esophagitis: Secondary | ICD-10-CM

## 2016-06-16 DIAGNOSIS — J4520 Mild intermittent asthma, uncomplicated: Secondary | ICD-10-CM

## 2016-06-16 DIAGNOSIS — R319 Hematuria, unspecified: Secondary | ICD-10-CM

## 2016-06-16 MED ORDER — ATORVASTATIN CALCIUM 40 MG PO TABS: ORAL_TABLET | ORAL | 1 refills | Status: DC

## 2016-06-16 MED ORDER — OMEPRAZOLE 40 MG PO CPDR: DELAYED_RELEASE_CAPSULE | ORAL | 1 refills | Status: DC

## 2016-06-16 MED ORDER — TESTOSTERONE 50 MG/5GM (1%) TD GEL: TRANSDERMAL | 5 refills | Status: DC

## 2016-06-16 MED ORDER — BUDESONIDE-FORMOTEROL FUMARATE 160-4.5 MCG/ACT IN AERO: 2.0000 | INHALATION_SPRAY | Freq: Two times a day (BID) | RESPIRATORY_TRACT | 4 refills | Status: DC

## 2016-06-16 MED ORDER — BUPROPION HCL ER (XL) 150 MG PO TB24: 150.0000 mg | ORAL_TABLET | Freq: Every day | ORAL | 1 refills | Status: DC

## 2016-06-16 MED ORDER — ESCITALOPRAM OXALATE 20 MG PO TABS: 20.0000 mg | ORAL_TABLET | Freq: Every day | ORAL | 1 refills | Status: DC

## 2016-06-16 NOTE — Progress Notes (Signed)
Subjective:    Patient ID: David Ortega, male    DOB: 07-Feb-1960, 56 y.o.   MRN: [id number]  HPI  David Ortega is here today for follow up of chronic medical problem.  Outpatient Encounter Prescriptions as of 06/16/2016  Medication Sig  . ALPRAZolam (XANAX) 0.25 MG tablet Take 1 tablet (0.25 mg total) by mouth 2 (two) times daily as needed for anxiety.  Marland Kitchen aspirin 325 MG tablet Take 325 mg by mouth daily.  Marland Kitchen atorvastatin (LIPITOR) 40 MG tablet TAKE 1 TABLET BY MOUTH ONCE DAILY NEED  APPT  FOR  MORE  REFILLS  . budesonide-formoterol (SYMBICORT) 160-4.5 MCG/ACT inhaler Inhale 2 puffs into the lungs 2 (two) times daily.  Marland Kitchen buPROPion (WELLBUTRIN XL) 150 MG 24 hr tablet Take 1 tablet (150 mg total) by mouth daily.  . Chlorphen-PE-Acetaminophen 4-10-325 MG TABS Take 1 tablet by mouth every 6 (six) hours as needed.  Marland Kitchen escitalopram (LEXAPRO) 20 MG tablet Take 1 tablet (20 mg total) by mouth daily.  . fluticasone (FLONASE) 50 MCG/ACT nasal spray Place 2 sprays into both nostrils daily.  Marland Kitchen losartan (COZAAR) 100 MG tablet Take 1 tablet (100 mg total) by mouth daily.  Marland Kitchen omeprazole (PRILOSEC) 40 MG capsule TAKE ONE CAPSULE BY MOUTH ONCE DAILY. NEED APPT FOR REFILLS.  Marland Kitchen ondansetron (ZOFRAN ODT) 8 MG disintegrating tablet Take 1 tablet (8 mg total) by mouth every 8 (eight) hours as needed for nausea or vomiting.  Marland Kitchen testosterone (ANDROGEL) 50 MG/5GM (1%) GEL Apply 1 5g package to shoulder daily  . triamcinolone cream (KENALOG) 0.1 % APPLY ONE APPLICATION TWICE DAILY  . VENTOLIN HFA 108 (90 Base) MCG/ACT inhaler INHALE TWO PUFFS BY MOUTH EVERY 6 HOURS AS NEEDED FOR WHEEZING OR SHORTNESS OF BREATH    1. Annual physical exam   2. Essential hypertension  No co chest pain,SOB or HA- does not check blood pressure at home  3. OSA (obstructive sleep apnea)  Wears cpap nightly  4. Gastroesophageal reflux disease without esophagitis  Omeprazole keeps symptoms under control  5. BMI 37.0-37.9, adult    He has actually gained weight since going back to work  6. Recurrent major depressive disorder, in partial remission ([place])  He is on wellbutrin and lexapro and seems to e doing better since he started a new job and is not sitting around home all the time Depression screen [facility] 2/9 06/16/2016 10/12/2015 10/04/2015 08/14/2015 07/24/2015  Decreased Interest 0 0 0 0 0  Down, Depressed, Hopeless 0 0 0 0 0  PHQ - 2 Score 0 0 0 0 0  Altered sleeping - - - - -  Tired, decreased energy - - - - -  Change in appetite - - - - -  Feeling bad or failure about yourself  - - - - -  Trouble concentrating - - - - -  Moving slowly or fidgety/restless - - - - -  Suicidal thoughts - - - - -  PHQ-9 Score - - - - -  Difficult doing work/chores - - - - -     7. Mixed hyperlipidemia  DOes not watch diet at all,    New complaints: None today     Review of Systems  Constitutional: Negative for diaphoresis.  Eyes: Negative for pain.  Respiratory: Negative for shortness of breath.   Cardiovascular: Negative for chest pain, palpitations and leg swelling.  Gastrointestinal: Negative for abdominal pain.  Endocrine: Negative for polydipsia.  Skin: Negative for rash.  Neurological:  Negative for dizziness, weakness and headaches.  Hematological: Does not bruise/bleed easily.  All other systems reviewed and are negative.      Objective:   Physical Exam  Constitutional: He is oriented to person, place, and time. He appears well-developed and well-nourished.  HENT:  Head: Normocephalic.  Right Ear: External ear normal.  Left Ear: External ear normal.  Nose: Nose normal.  Mouth/Throat: Oropharynx is clear and moist.  Eyes: EOM are normal. Pupils are equal, round, and reactive to light.  Neck: Normal range of motion. Neck supple. No JVD present. No thyromegaly present.  Cardiovascular: Normal rate, regular rhythm, normal heart sounds and intact distal pulses.  Exam reveals no gallop and no friction rub.    No murmur heard. Pulmonary/Chest: Effort normal and breath sounds normal. No respiratory distress. He has no wheezes. He has no rales. He exhibits no tenderness.  Abdominal: Soft. Bowel sounds are normal. He exhibits no mass. There is no tenderness.  Musculoskeletal: Normal range of motion. He exhibits no edema.  Lymphadenopathy:    He has no cervical adenopathy.  Neurological: He is alert and oriented to person, place, and time. No cranial nerve deficit.  Skin: Skin is warm and dry.  Psychiatric: He has a normal mood and affect. His behavior is normal. Judgment and thought content normal.   BP 134/77   Pulse 88   Temp 98.4 F (36.9 C) (Oral)   Ht 6' 1"  (1.854 m)   Wt 299 lb (135.6 kg)   BMI 39.45 kg/m        Assessment & Plan:  1. Annual physical exam  2. Essential hypertension Low sodium diet - CMP14+EGFR  3. OSA (obstructive sleep apnea) Wear cpap noghtly for DOT compliance  4. Gastroesophageal reflux disease without esophagitis Avoid spicy foods Do not eat 2 hours prior to bedtime - omeprazole (PRILOSEC) 40 MG capsule; TAKE ONE CAPSULE BY MOUTH ONCE DAILY. NEED APPT FOR REFILLS.  Dispense: 90 capsule; Refill: 1  5. BMI 39.0-39.9,adult Discussed diet and exercise for person with BMI >25 Will recheck weight in 3-6 months  6. Recurrent major depressive disorder, in partial remission (HCC) Stress management - escitalopram (LEXAPRO) 20 MG tablet; Take 1 tablet (20 mg total) by mouth daily.  Dispense: 90 tablet; Refill: 1 - buPROPion (WELLBUTRIN XL) 150 MG 24 hr tablet; Take 1 tablet (150 mg total) by mouth daily.  Dispense: 90 tablet; Refill: 1  7. Mixed hyperlipidemia Low fta diet  - atorvastatin (LIPITOR) 40 MG tablet; TAKE 1 TABLET BY MOUTH ONCE DAILY NEED  APPT  FOR  MORE  REFILLS  Dispense: 90 tablet; Refill: 1 - Lipid panel  8. Asthma, chronic, mild intermittent, uncomplicated - budesonide-formoterol (SYMBICORT) 160-4.5 MCG/ACT inhaler; Inhale 2 puffs into  the lungs 2 (two) times daily.  Dispense: 10.2 g; Refill: 4  9. Hypogonadism in male - testosterone (ANDROGEL) 50 MG/5GM (1%) GEL; Apply 1 5g package to shoulder daily  Dispense: 30 Package; Refill: 5  10. Prostate cancer screening - PSA, total and free    Labs pending Health maintenance reviewed Diet and exercise encouraged Continue all meds Follow up  In 6 months   [place], FNP

## 2016-06-16 NOTE — Addendum Note (Signed)
Addended by: Rolena Infante on: 06/16/2016 10:25 AM   Modules accepted: Orders

## 2016-06-16 NOTE — Patient Instructions (Signed)

## 2016-06-24 ENCOUNTER — Other Ambulatory Visit: Payer: BLUE CROSS/BLUE SHIELD

## 2016-06-24 LAB — URINALYSIS, MICROSCOPIC ONLY
Bacteria, UA: NONE SEEN
Renal Epithel, UA: NONE SEEN /hpf
WBC UA: NONE SEEN /HPF (ref 0–?)

## 2016-06-25 LAB — CMP14+EGFR
ALBUMIN: 3.9 g/dL (ref 3.5–5.5)
ALT: 27 IU/L (ref 0–44)
AST: 24 IU/L (ref 0–40)
Albumin/Globulin Ratio: 1.4 (ref 1.2–2.2)
Alkaline Phosphatase: 128 IU/L — ABNORMAL HIGH (ref 39–117)
BUN / CREAT RATIO: 10 (ref 9–20)
BUN: 10 mg/dL (ref 6–24)
Bilirubin Total: 0.5 mg/dL (ref 0.0–1.2)
CALCIUM: 9 mg/dL (ref 8.7–10.2)
CO2: 23 mmol/L (ref 18–29)
CREATININE: 1.01 mg/dL (ref 0.76–1.27)
Chloride: 103 mmol/L (ref 96–106)
GFR, EST AFRICAN AMERICAN: 95 mL/min/{1.73_m2} (ref >59)
GFR, EST NON AFRICAN AMERICAN: 82 mL/min/{1.73_m2} (ref >59)
GLUCOSE: 135 mg/dL — AB (ref 65–99)
Globulin, Total: 2.7 g/dL (ref 1.5–4.5)
Potassium: 4.3 mmol/L (ref 3.5–5.2)
Sodium: 139 mmol/L (ref 134–144)
TOTAL PROTEIN: 6.6 g/dL (ref 6.0–8.5)

## 2016-06-25 LAB — LIPID PANEL
Chol/HDL Ratio: 3.2 ratio (ref 0.0–5.0)
Cholesterol, Total: 165 mg/dL (ref 100–199)
HDL: 51 mg/dL (ref >39)
LDL CALC: 77 mg/dL (ref 0–99)
Triglycerides: 186 mg/dL — ABNORMAL HIGH (ref 0–149)
VLDL CHOLESTEROL CAL: 37 mg/dL (ref 5–40)

## 2016-06-25 LAB — PSA, TOTAL AND FREE
PROSTATE SPECIFIC AG, SERUM: 0.4 ng/mL (ref 0.0–4.0)
PSA FREE: 0.1 ng/mL
PSA, Free Pct: 25 %

## 2016-07-15 ENCOUNTER — Telehealth: Payer: Self-pay

## 2016-07-15 NOTE — Telephone Encounter (Addendum)
Gastroenterology Pre-Procedure Review  Request Date: 09/05/2016 Requesting Physician: Chevis Pretty, NP  PATIENT REVIEW QUESTIONS: The patient responded to the following health history questions as indicated:    Please note pt has Xanax for prn, but said he seldom takes any now. Probably 3 months since he took it last.   1. Diabetes Melitis: no 2. Joint replacements in the past 12 months: no 3. Major health problems in the past 3 months: no 4. Has an artificial valve or MVP: no 5. Has a defibrillator: no 6. Has been advised in past to take antibiotics in advance of a procedure like teeth cleaning: no 7. Family history of colon cancer: no  8. Alcohol Use: no 9. History of sleep apnea: YES    HAS C PAP 10. History of coronary artery or other vascular stents placed within the last 12 months: no    MEDICATIONS & ALLERGIES:    Patient reports the following regarding taking any blood thinners:   Plavix? no Aspirin? yes Coumadin? no Brilinta? no Xarelto? no Eliquis? no Pradaxa? no Savaysa? no Effient? no  Patient confirms/reports the following medications:  Current Outpatient Prescriptions  Medication Sig Dispense Refill  . ALPRAZolam (XANAX) 0.25 MG tablet Take 1 tablet (0.25 mg total) by mouth 2 (two) times daily as needed for anxiety. 20 tablet 0  . aspirin 325 MG tablet Take 325 mg by mouth daily.    Marland Kitchen atorvastatin (LIPITOR) 40 MG tablet TAKE 1 TABLET BY MOUTH ONCE DAILY NEED  APPT  FOR  MORE  REFILLS 90 tablet 1  . budesonide-formoterol (SYMBICORT) 160-4.5 MCG/ACT inhaler Inhale 2 puffs into the lungs 2 (two) times daily. 10.2 g 4  . buPROPion (WELLBUTRIN XL) 150 MG 24 hr tablet Take 1 tablet (150 mg total) by mouth daily. 90 tablet 1  . losartan (COZAAR) 100 MG tablet Take 1 tablet (100 mg total) by mouth daily. 90 tablet 3  . omeprazole (PRILOSEC) 40 MG capsule TAKE ONE CAPSULE BY MOUTH ONCE DAILY. NEED APPT FOR REFILLS. 90 capsule 1  . rOPINIRole (REQUIP) 1 MG tablet  Take 1 mg by mouth at bedtime.    Marland Kitchen testosterone (ANDROGEL) 50 MG/5GM (1%) GEL Apply 1 5g package to shoulder daily 30 Package 5  . triamcinolone cream (KENALOG) 0.1 % APPLY ONE APPLICATION TWICE DAILY 453.6 g 1  . VENTOLIN HFA 108 (90 Base) MCG/ACT inhaler INHALE TWO PUFFS BY MOUTH EVERY 6 HOURS AS NEEDED FOR WHEEZING OR SHORTNESS OF BREATH 18 each 4  . Chlorphen-PE-Acetaminophen 4-10-325 MG TABS Take 1 tablet by mouth every 6 (six) hours as needed. (Patient not taking: Reported on 07/15/2016) 20 tablet 0  . escitalopram (LEXAPRO) 20 MG tablet Take 1 tablet (20 mg total) by mouth daily. (Patient not taking: Reported on 07/15/2016) 90 tablet 1  . fluticasone (FLONASE) 50 MCG/ACT nasal spray Place 2 sprays into both nostrils daily. (Patient not taking: Reported on 07/15/2016) 16 g 6  . ondansetron (ZOFRAN ODT) 8 MG disintegrating tablet Take 1 tablet (8 mg total) by mouth every 8 (eight) hours as needed for nausea or vomiting. (Patient not taking: Reported on 07/15/2016) 20 tablet 0   No current facility-administered medications for this visit.     Patient confirms/reports the following allergies:  Allergies  Allergen Reactions  . Lisinopril   . Simvastatin     Leg pain    No orders of the defined types were placed in this encounter.   AUTHORIZATION INFORMATION Primary Insurance:   ID #:  Group #:  Pre-Cert / Auth required: Pre-Cert / Auth #:   Secondary Insurance:   ID #:  Group #:  Pre-Cert / Auth required: Pre-Cert / Auth #:   SCHEDULE INFORMATION: Procedure has been scheduled as follows:  Date   09/05/2016              Time: 2:15 PM Location: [facility] Short Stay  This Gastroenterology Pre-Precedure Review Form is being routed to the following provider(s): R. Garfield Cornea, MD

## 2016-07-16 ENCOUNTER — Other Ambulatory Visit: Payer: Self-pay

## 2016-07-16 DIAGNOSIS — Z1211 Encounter for screening for malignant neoplasm of colon: Secondary | ICD-10-CM

## 2016-07-16 NOTE — Telephone Encounter (Signed)
On Wellbutrin. History of Xanax but none in the past 3 months. Ok to schedule.  Please ask patient to bring CPAP settings to Endo day of procedure.

## 2016-07-17 MED ORDER — PEG 3350-KCL-NA BICARB-NACL 420 G PO SOLR: 4000.0000 mL | ORAL | 0 refills | Status: DC

## 2016-07-17 MED ORDER — NA SULFATE-K SULFATE-MG SULF 17.5-3.13-1.6 GM/177ML PO SOLN: 1.0000 | ORAL | 0 refills | Status: DC

## 2016-07-17 NOTE — Telephone Encounter (Signed)
Rx sent to the pharmacy and instructions mailed to pt. C-pap noted on orders.

## 2016-07-17 NOTE — Telephone Encounter (Signed)
Suprep not covered. Sent in [place] and mailed instructions. Took the instructions for the Suprep out of the mail.

## 2016-07-22 NOTE — Telephone Encounter (Signed)
NO PA is needed for TCS 

## 2016-09-03 ENCOUNTER — Telehealth: Payer: Self-pay

## 2016-09-03 NOTE — Telephone Encounter (Signed)
Pt called office. He had lost his instructions for his TCS. TCS will be 09/05/16 at 11:15am, he is to arrive at 10:15am (this is new time). Gave instructions over the phone.

## 2016-09-05 ENCOUNTER — Ambulatory Visit (HOSPITAL_COMMUNITY)
Admission: RE | Admit: 2016-09-05 | Discharge: 2016-09-05 | Disposition: A | Discharge status: Home / Self Care | Payer: BLUE CROSS/BLUE SHIELD | Source: Ambulatory Visit | Attending: Internal Medicine | Admitting: Internal Medicine

## 2016-09-05 ENCOUNTER — Encounter (HOSPITAL_COMMUNITY)
Admission: RE | Disposition: A | Discharge status: Home / Self Care | Payer: Self-pay | Source: Ambulatory Visit | Attending: Internal Medicine

## 2016-09-05 ENCOUNTER — Encounter (HOSPITAL_COMMUNITY): Payer: Self-pay | Admitting: *Deleted

## 2016-09-05 DIAGNOSIS — F17210 Nicotine dependence, cigarettes, uncomplicated: Secondary | ICD-10-CM | POA: Insufficient documentation

## 2016-09-05 DIAGNOSIS — D124 Benign neoplasm of descending colon: Secondary | ICD-10-CM

## 2016-09-05 DIAGNOSIS — J449 Chronic obstructive pulmonary disease, unspecified: Secondary | ICD-10-CM | POA: Diagnosis not present

## 2016-09-05 DIAGNOSIS — Z79899 Other long term (current) drug therapy: Secondary | ICD-10-CM | POA: Diagnosis not present

## 2016-09-05 DIAGNOSIS — Z7982 Long term (current) use of aspirin: Secondary | ICD-10-CM | POA: Insufficient documentation

## 2016-09-05 DIAGNOSIS — K219 Gastro-esophageal reflux disease without esophagitis: Secondary | ICD-10-CM | POA: Diagnosis not present

## 2016-09-05 DIAGNOSIS — I10 Essential (primary) hypertension: Secondary | ICD-10-CM | POA: Diagnosis not present

## 2016-09-05 DIAGNOSIS — D122 Benign neoplasm of ascending colon: Secondary | ICD-10-CM

## 2016-09-05 DIAGNOSIS — D128 Benign neoplasm of rectum: Secondary | ICD-10-CM | POA: Diagnosis not present

## 2016-09-05 DIAGNOSIS — Z1211 Encounter for screening for malignant neoplasm of colon: Secondary | ICD-10-CM | POA: Diagnosis present

## 2016-09-05 DIAGNOSIS — Z1212 Encounter for screening for malignant neoplasm of rectum: Secondary | ICD-10-CM

## 2016-09-05 DIAGNOSIS — K5730 Diverticulosis of large intestine without perforation or abscess without bleeding: Secondary | ICD-10-CM | POA: Insufficient documentation

## 2016-09-05 DIAGNOSIS — Z87442 Personal history of urinary calculi: Secondary | ICD-10-CM | POA: Insufficient documentation

## 2016-09-05 DIAGNOSIS — E785 Hyperlipidemia, unspecified: Secondary | ICD-10-CM | POA: Diagnosis not present

## 2016-09-05 HISTORY — PX: COLONOSCOPY: SHX5424

## 2016-09-05 SURGERY — COLONOSCOPY
Anesthesia: Moderate Sedation

## 2016-09-05 MED ORDER — ONDANSETRON HCL 4 MG/2ML IJ SOLN
INTRAMUSCULAR | Status: AC
  Filled 2016-09-05: qty 2

## 2016-09-05 MED ORDER — MIDAZOLAM HCL 5 MG/5ML IJ SOLN
INTRAMUSCULAR | Status: DC | PRN
  Administered 2016-09-05 (×2): 2 mg via INTRAVENOUS
  Administered 2016-09-05 (×2): 1 mg via INTRAVENOUS

## 2016-09-05 MED ORDER — MIDAZOLAM HCL 5 MG/5ML IJ SOLN
INTRAMUSCULAR | Status: AC
  Filled 2016-09-05: qty 10

## 2016-09-05 MED ORDER — MEPERIDINE HCL 100 MG/ML IJ SOLN
INTRAMUSCULAR | Status: DC | PRN
  Administered 2016-09-05: 50 mg via INTRAVENOUS
  Administered 2016-09-05: 25 mg via INTRAVENOUS

## 2016-09-05 MED ORDER — SODIUM CHLORIDE 0.9 % IV SOLN
INTRAVENOUS | Status: DC
  Administered 2016-09-05: 1000 mL via INTRAVENOUS

## 2016-09-05 MED ORDER — ONDANSETRON HCL 4 MG/2ML IJ SOLN
INTRAMUSCULAR | Status: DC | PRN
  Administered 2016-09-05: 4 mg via INTRAVENOUS

## 2016-09-05 MED ORDER — MEPERIDINE HCL 100 MG/ML IJ SOLN
INTRAMUSCULAR | Status: AC
  Filled 2016-09-05: qty 2

## 2016-09-05 NOTE — H&P (Signed)
@LOGO@   Primary Care Physician:  Martin, Mary-Margaret, FNP Primary Gastroenterologist:  Dr. Rourk  Pre-Procedure History & Physical: HPI:  David Ortega is a 56 y.o. male here for first ever screening colonoscopy. No prior colonoscopy. No bowel symptoms. No family history of colon cancer.  Past Medical History:  Diagnosis Date  . Asthma   . COPD (chronic obstructive pulmonary disease) (HCC)   . Dyslipidemia   . GERD (gastroesophageal reflux disease)   . Hyperlipidemia   . Hypertension   . Kidney stones 11/2013    Past Surgical History:  Procedure Laterality Date  . BACK SURGERY    . CHOLECYSTECTOMY    . ESOPHAGOGASTRODUODENOSCOPY    . LEFT ANKLE SURGERY      Prior to Admission medications   Medication Sig Start Date End Date Taking? Authorizing Provider  aspirin 325 MG tablet Take 325 mg by mouth daily.   Yes [provider]  atorvastatin (LIPITOR) 40 MG tablet TAKE 1 TABLET BY MOUTH ONCE DAILY NEED  APPT  FOR  MORE  REFILLS Patient taking differently: Take 40 mg by mouth every evening.  06/16/16  Yes Martin, Mary-Margaret, FNP  budesonide-formoterol (SYMBICORT) 160-4.5 MCG/ACT inhaler Inhale 2 puffs into the lungs 2 (two) times daily. 06/16/16  Yes Martin, Mary-Margaret, FNP  buPROPion (WELLBUTRIN XL) 150 MG 24 hr tablet Take 1 tablet (150 mg total) by mouth daily. Patient taking differently: Take 150 mg by mouth every evening.  06/16/16  Yes Martin, Mary-Margaret, FNP  escitalopram (LEXAPRO) 20 MG tablet Take 1 tablet (20 mg total) by mouth daily. Patient taking differently: Take 20 mg by mouth every evening.  06/16/16  Yes Martin, Mary-Margaret, FNP  losartan (COZAAR) 100 MG tablet Take 1 tablet (100 mg total) by mouth daily. 10/12/15  Yes Martin, Mary-Margaret, FNP  omeprazole (PRILOSEC) 40 MG capsule TAKE ONE CAPSULE BY MOUTH ONCE DAILY. NEED APPT FOR REFILLS. Patient taking differently: Take 40 mg by mouth every evening.  06/16/16  Yes Martin, Mary-Margaret, FNP   polyethylene glycol-electrolytes (TRILYTE) 420 g solution Take 4,000 mLs by mouth as directed. 07/17/16  Yes Rourk, Robert M, MD  testosterone (ANDROGEL) 50 MG/5GM (1%) GEL Apply 1 5g package to shoulder daily Patient taking differently: Place 5 g onto the skin daily. Apply 1 5g package to shoulder daily 06/16/16  Yes Martin, Mary-Margaret, FNP  fluticasone (FLONASE) 50 MCG/ACT nasal spray Place 2 sprays into both nostrils daily. Patient taking differently: Place 2 sprays into both nostrils daily as needed (for allergies.).  10/04/15   Martin, Mary-Margaret, FNP  Na Sulfate-K Sulfate-Mg Sulf (SUPREP BOWEL PREP KIT) 17.5-3.13-1.6 GM/180ML SOLN Take 1 kit by mouth as directed. Patient not taking: Reported on 09/04/2016 07/17/16   Rourk, Robert M, MD  triamcinolone cream (KENALOG) 0.1 % APPLY ONE APPLICATION TWICE DAILY Patient taking differently: APPLY ONE APPLICATION TWICE DAILY AS NEEDED FOR SKIN IRRITATION. 04/11/16   Martin, Mary-Margaret, FNP  VENTOLIN HFA 108 (90 Base) MCG/ACT inhaler INHALE TWO PUFFS BY MOUTH EVERY 6 HOURS AS NEEDED FOR WHEEZING OR SHORTNESS OF BREATH 09/03/15   Martin, Mary-Margaret, FNP    Allergies as of 07/16/2016 - Review Complete 07/15/2016  Allergen Reaction Noted  . Lisinopril  05/31/2012  . Simvastatin  04/21/2013    Family History  Problem Relation Age of Onset  . Diabetes Mother   . Heart disease Mother   . Diabetes Sister   . Cancer Brother   . Cancer Sister        pancreatic      Social History   Social History  . Marital status: Married    Spouse name: N/A  . Number of children: N/A  . Years of education: N/A   Occupational History  . Not on file.   Social History Main Topics  . Smoking status: Current Every Day Smoker    Packs/day: 1.00    Years: 38.00  . Smokeless tobacco: Never Used  . Alcohol use No  . Drug use: No  . Sexual activity: Yes   Other Topics Concern  . Not on file   Social History Narrative  . No narrative on file     Review of Systems: See HPI, otherwise negative ROS  Physical Exam: BP (!) 156/86   Pulse 79   Temp 98.3 F (36.8 C) (Oral)   Resp 17   Ht 6' 2" (1.88 m)   Wt 280 lb (127 kg)   SpO2 (!) 82%   BMI 35.95 kg/m  General:   Alert,  Well-developed, well-nourished, pleasant and cooperative in NAD Neck:  Supple; no masses or thyromegaly. No significant cervical adenopathy. Lungs:  Clear throughout to auscultation.   No wheezes, crackles, or rhonchi. No acute distress. Heart:  Regular rate and rhythm; no murmurs, clicks, rubs,  or gallops. Abdomen:Obese.  Non-distended, normal bowel sounds.  Soft and nontender without appreciable mass or hepatosplenomegaly.  Pulses:  Normal pulses noted. Extremities:  Without clubbing or edema.  Impression:  Pleasant 56 year old gentleman here for first ever average risk screening colonoscopy.  Recommendations: I've offered the patient screening colonoscopy today.  The risks, benefits, limitations, alternatives and imponderables have been reviewed with the patient. Questions have been answered. All parties are agreeable.    Notice: This dictation was prepared with Dragon dictation along with smaller phrase technology. Any transcriptional errors that result from this process are unintentional and may not be corrected upon review.

## 2016-09-05 NOTE — Discharge Instructions (Addendum)
Colon polyp and diverticulosis information provided  Recommendations to follow pending review of pathology report  Colonoscopy Discharge Instructions  Read the instructions outlined below and refer to this sheet in the next few weeks. These discharge instructions provide you with general information on caring for yourself after you leave the hospital. Your doctor may also give you specific instructions. While your treatment has been planned according to the most current medical practices available, unavoidable complications occasionally occur. If you have any problems or questions after discharge, call Dr. Gala Romney at [phone]. ACTIVITY  You may resume your regular activity, but move at a slower pace for the next 24 hours.   Take frequent rest periods for the next 24 hours.   Walking will help get rid of the air and reduce the bloated feeling in your belly (abdomen).   No driving for 24 hours (because of the medicine (anesthesia) used during the test).    Do not sign any important legal documents or operate any machinery for 24 hours (because of the anesthesia used during the test).  NUTRITION  Drink plenty of fluids.   You may resume your normal diet as instructed by your doctor.   Begin with a light meal and progress to your normal diet. Heavy or fried foods are harder to digest and may make you feel sick to your stomach (nauseated).   Avoid alcoholic beverages for 24 hours or as instructed.  MEDICATIONS  You may resume your normal medications unless your doctor tells you otherwise.  WHAT YOU CAN EXPECT TODAY  Some feelings of bloating in the abdomen.   Passage of more gas than usual.   Spotting of blood in your stool or on the toilet paper.  IF YOU HAD POLYPS REMOVED DURING THE COLONOSCOPY:  No aspirin products for 7 days or as instructed.   No alcohol for 7 days or as instructed.   Eat a soft diet for the next 24 hours.  FINDING OUT THE RESULTS OF YOUR TEST Not all test  results are available during your visit. If your test results are not back during the visit, make an appointment with your caregiver to find out the results. Do not assume everything is normal if you have not heard from your caregiver or the medical facility. It is important for you to follow up on all of your test results.  SEEK IMMEDIATE MEDICAL ATTENTION IF:  You have more than a spotting of blood in your stool.   Your belly is swollen (abdominal distention).   You are nauseated or vomiting.   You have a temperature over 101.   You have abdominal pain or discomfort that is severe or gets worse throughout the day.     Diverticulosis Diverticulosis is a condition that develops when small pouches (diverticula) form in the wall of the large intestine (colon). The colon is where water is absorbed and stool is formed. The pouches form when the inside layer of the colon pushes through weak spots in the outer layers of the colon. You may have a few pouches or many of them. What are the causes? The cause of this condition is not known. What increases the risk? The following factors may make you more likely to develop this condition:  Being older than age 26. Your risk for this condition increases with age. Diverticulosis is rare among people younger than age 89. By age 11, many people have it.  Eating a low-fiber diet.  Having frequent constipation.  Being overweight.  Not getting enough exercise.  Smoking.  Taking over-the-counter pain medicines, like aspirin and ibuprofen.  Having a family history of diverticulosis.  What are the signs or symptoms? In most people, there are no symptoms of this condition. If you do have symptoms, they may include:  Bloating.  Cramps in the abdomen.  Constipation or diarrhea.  Pain in the lower left side of the abdomen.  How is this diagnosed? This condition is most often diagnosed during an exam for other colon problems. Because  diverticulosis usually has no symptoms, it often cannot be diagnosed independently. This condition may be diagnosed by:  Using a flexible scope to examine the colon (colonoscopy).  Taking an X-ray of the colon after dye has been put into the colon (barium enema).  Doing a CT scan.  How is this treated? You may not need treatment for this condition if you have never developed an infection related to diverticulosis. If you have had an infection before, treatment may include:  Eating a high-fiber diet. This may include eating more fruits, vegetables, and grains.  Taking a fiber supplement.  Taking a live bacteria supplement (probiotic).  Taking medicine to relax your colon.  Taking antibiotic medicines.  Follow these instructions at home:  Drink 6-8 glasses of water or more each day to prevent constipation.  Try not to strain when you have a bowel movement.  If you have had an infection before: ? Eat more fiber as directed by your health care provider or your diet and nutrition specialist (dietitian). ? Take a fiber supplement or probiotic, if your health care provider approves.  Take over-the-counter and prescription medicines only as told by your health care provider.  If you were prescribed an antibiotic, take it as told by your health care provider. Do not stop taking the antibiotic even if you start to feel better.  Keep all follow-up visits as told by your health care provider. This is important. Contact a health care provider if:  You have pain in your abdomen.  You have bloating.  You have cramps.  You have not had a bowel movement in 3 days. Get help right away if:  Your pain gets worse.  Your bloating becomes very bad.  You have a fever or chills, and your symptoms suddenly get worse.  You vomit.  You have bowel movements that are bloody or black.  You have bleeding from your rectum. Summary  Diverticulosis is a condition that develops when small  pouches (diverticula) form in the wall of the large intestine (colon).  You may have a few pouches or many of them.  This condition is most often diagnosed during an exam for other colon problems.  If you have had an infection related to diverticulosis, treatment may include increasing the fiber in your diet, taking supplements, or taking medicines. This information is not intended to replace advice given to you by your health care provider. Make sure you discuss any questions you have with your health care provider. Document Released: 10/11/2003 Document Revised: 12/03/2015 Document Reviewed: 12/03/2015 Elsevier Interactive Patient Education  2017 [employer].   Colon Polyps Polyps are tissue growths inside the body. Polyps can grow in many places, including the large intestine (colon). A polyp may be a round bump or a mushroom-shaped growth. You could have one polyp or several. Most colon polyps are noncancerous (benign). However, some colon polyps can become cancerous over time. What are the causes? The exact cause of colon polyps is not known.  What increases the risk? This condition is more likely to develop in people who:  Have a family history of colon cancer or colon polyps.  Are older than 36 or older than 45 if they are African American.  Have inflammatory bowel disease, such as ulcerative colitis or Crohn disease.  Are overweight.  Smoke cigarettes.  Do not get enough exercise.  Drink too much alcohol.  Eat a diet that is: ? High in fat and red meat. ? Low in fiber.  Had childhood cancer that was treated with abdominal radiation.  What are the signs or symptoms? Most polyps do not cause symptoms. If you have symptoms, they may include:  Blood coming from your rectum when having a bowel movement.  Blood in your stool.The stool may look dark red or black.  A change in bowel habits, such as constipation or diarrhea.  How is this diagnosed? This condition is  diagnosed with a colonoscopy. This is a procedure that uses a lighted, flexible scope to look at the inside of your colon. How is this treated? Treatment for this condition involves removing any polyps that are found. Those polyps will then be tested for cancer. If cancer is found, your health care provider will talk to you about options for colon cancer treatment. Follow these instructions at home: Diet  Eat plenty of fiber, such as fruits, vegetables, and whole grains.  Eat foods that are high in calcium and vitamin D, such as milk, cheese, yogurt, eggs, liver, fish, and broccoli.  Limit foods high in fat, red meats, and processed meats, such as hot dogs, sausage, bacon, and lunch meats.  Maintain a healthy weight, or lose weight if recommended by your health care provider. General instructions  Do not smoke cigarettes.  Do not drink alcohol excessively.  Keep all follow-up visits as told by your health care provider. This is important. This includes keeping regularly scheduled colonoscopies. Talk to your health care provider about when you need a colonoscopy.  Exercise every day or as told by your health care provider. Contact a health care provider if:  You have new or worsening bleeding during a bowel movement.  You have new or increased blood in your stool.  You have a change in bowel habits.  You unexpectedly lose weight. This information is not intended to replace advice given to you by your health care provider. Make sure you discuss any questions you have with your health care provider. Document Released: 10/10/2003 Document Revised: 06/21/2015 Document Reviewed: 12/04/2014 Elsevier Interactive Patient Education  [employer].

## 2016-09-05 NOTE — Op Note (Signed)
[facility] Patient Name: David Ortega Procedure Date: 09/05/2016 10:31 AM MRN: [id number] Date of Birth: 1960/07/13 Attending MD: Norvel Richards , MD CSN: [id number] Age: 56 Admit Type: Outpatient Procedure:                Colonoscopy Indications:              Screening for colorectal malignant neoplasm Providers:                Norvel Richards, MD, Janeece Riggers, RN, Aram Candela Referring MD:              Medicines:                Midazolam 6 mg IV, Meperidine 125 mg IV,                            Ondansetron 4 mg IV Complications:            No immediate complications. Estimated Blood Loss:     Estimated blood loss was minimal. Procedure:                Pre-Anesthesia Assessment:                           - Prior to the procedure, a History and Physical                            was performed, and patient medications and                            allergies were reviewed. The patient's tolerance of                            previous anesthesia was also reviewed. The risks                            and benefits of the procedure and the sedation                            options and risks were discussed with the patient.                            All questions were answered, and informed consent                            was obtained. Prior Anticoagulants: The patient has                            taken no previous anticoagulant or antiplatelet                            agents. ASA Grade Assessment: III - A patient with  severe systemic disease. After reviewing the risks                            and benefits, the patient was deemed in                            satisfactory condition to undergo the procedure.                           After obtaining informed consent, the colonoscope                            was passed under direct vision. Throughout the                            procedure, the  patient's blood pressure, pulse, and                            oxygen saturations were monitored continuously. The                            [id number]) was introduced through the anus                            and advanced to the the cecum, identified by                            appendiceal orifice and ileocecal valve. The                            colonoscopy was performed without difficulty. The                            patient tolerated the procedure well. The entire                            colon was well visualized. The quality of the bowel                            preparation was adequate. The ileocecal valve,                            appendiceal orifice, and rectum were photographed. Scope In: 10:58:52 AM Scope Out: 11:19:22 AM Scope Withdrawal Time: 0 hours 9 minutes 19 seconds  Total Procedure Duration: 0 hours 20 minutes 30 seconds  Findings:      The perianal and digital rectal examinations were normal.      Scattered diverticula were found in the entire colon.      Three semi-pedunculated polyps were found in the ascending colon. The       polyps were 4 to 6 mm in size. These polyps were removed with a cold       snare. Resection and retrieval were complete. Estimated blood loss was       minimal.      A 8 mm  polyp was found in the descending colon. The polyp was       semi-pedunculated. The polyp was removed with a hot snare. Resection and       retrieval were complete. Estimated blood loss: none.      A 3 mm polyp was found in the rectum. The polyp was sessile. The polyp       was removed with a cold snare. Resection and retrieval were complete.       Estimated blood loss was minimal.      The exam was otherwise without abnormality on direct and retroflexion       views. Impression:               - Diverticulosis in the entire examined colon.                           - Three 4 to 6 mm polyps in the ascending colon,                            removed  with a cold snare. Resected and retrieved.                           - One 8 mm polyp in the descending colon, removed                            with a hot snare. Resected and retrieved.                           - One 3 mm polyp in the rectum, removed with a cold                            snare. Resected and retrieved.                           - The examination was otherwise normal on direct                            and retroflexion views. Moderate Sedation:      Moderate (conscious) sedation was administered by the endoscopy nurse       and supervised by the endoscopist. The following parameters were       monitored: oxygen saturation, heart rate, blood pressure, respiratory       rate, EKG, adequacy of pulmonary ventilation, and response to care.       Total physician intraservice time was 27 minutes. Recommendation:           - Patient has a contact number available for                            emergencies. The signs and symptoms of potential                            delayed complications were discussed with the                            patient. Return to normal activities  tomorrow.                            Written discharge instructions were provided to the                            patient.                           - Resume previous diet.                           - Continue present medications.                           - Repeat colonoscopy date to be determined after                            pending pathology results are reviewed for                            surveillance based on pathology results.                           - Return to GI clinic (date not yet determined). Procedure Code(s):        --- Professional ---                           [phone], Colonoscopy, flexible; with removal of                            tumor(s), polyp(s), or other lesion(s) by snare                            technique                           99152, Moderate sedation services  provided by the                            same physician or other qualified health care                            professional performing the diagnostic or                            therapeutic service that the sedation supports,                            requiring the presence of an independent trained                            observer to assist in the monitoring of the                            patient's level of consciousness and physiological  status; initial 15 minutes of intraservice time,                            patient age 59 years or older                           [id number], Moderate sedation services; each additional                            15 minutes intraservice time Diagnosis Code(s):        --- Professional ---                           Z12.11, Encounter for screening for malignant                            neoplasm of colon                           D12.2, Benign neoplasm of ascending colon                           D12.4, Benign neoplasm of descending colon                           K62.1, Rectal polyp                           K57.30, Diverticulosis of large intestine without                            perforation or abscess without bleeding CPT copyright 2016 American Medical Association. All rights reserved. The codes documented in this report are preliminary and upon coder review may  be revised to meet current compliance requirements. David Ortega. David Lazalde, MD Norvel Richards, MD 09/05/2016 11:27:50 AM This report has been signed electronically. Number of Addenda: 0

## 2016-09-08 ENCOUNTER — Encounter: Payer: Self-pay | Admitting: Internal Medicine

## 2016-09-09 ENCOUNTER — Encounter (HOSPITAL_COMMUNITY): Payer: Self-pay | Admitting: Internal Medicine

## 2016-09-15 ENCOUNTER — Other Ambulatory Visit: Payer: Self-pay | Admitting: Nurse Practitioner

## 2016-09-15 DIAGNOSIS — F32A Depression, unspecified: Secondary | ICD-10-CM

## 2016-09-15 DIAGNOSIS — F329 Major depressive disorder, single episode, unspecified: Secondary | ICD-10-CM

## 2016-10-27 ENCOUNTER — Other Ambulatory Visit: Payer: Self-pay | Admitting: Nurse Practitioner

## 2016-10-27 DIAGNOSIS — I10 Essential (primary) hypertension: Secondary | ICD-10-CM

## 2016-11-27 ENCOUNTER — Other Ambulatory Visit: Payer: Self-pay | Admitting: Nurse Practitioner

## 2016-11-27 DIAGNOSIS — J4520 Mild intermittent asthma, uncomplicated: Secondary | ICD-10-CM

## 2016-12-08 ENCOUNTER — Telehealth: Payer: Self-pay | Admitting: Nurse Practitioner

## 2016-12-08 NOTE — Telephone Encounter (Signed)
Call to pt Pt states he is having fever, chills, SOB, chest pain. Offered appt today Pt declined Only wants to see MMM Offered appt in the AM Pt states he can not come in until 4:00 tomorrow afternoon Please advise

## 2016-12-09 NOTE — Telephone Encounter (Signed)
ntbs

## 2016-12-09 NOTE — Telephone Encounter (Signed)
Called to offer patient appt and he states that he is feeling better and doesn't feel like he ntbs at this time. Will contact office if he needs [place] further

## 2017-01-09 ENCOUNTER — Other Ambulatory Visit: Payer: Self-pay | Admitting: Nurse Practitioner

## 2017-01-09 DIAGNOSIS — J4520 Mild intermittent asthma, uncomplicated: Secondary | ICD-10-CM

## 2017-01-26 ENCOUNTER — Other Ambulatory Visit: Payer: Self-pay | Admitting: Nurse Practitioner

## 2017-01-26 DIAGNOSIS — F32A Depression, unspecified: Secondary | ICD-10-CM

## 2017-01-26 DIAGNOSIS — F329 Major depressive disorder, single episode, unspecified: Secondary | ICD-10-CM

## 2017-01-26 DIAGNOSIS — F3341 Major depressive disorder, recurrent, in partial remission: Secondary | ICD-10-CM

## 2017-01-26 DIAGNOSIS — K219 Gastro-esophageal reflux disease without esophagitis: Secondary | ICD-10-CM

## 2017-01-26 DIAGNOSIS — E782 Mixed hyperlipidemia: Secondary | ICD-10-CM

## 2017-01-26 DIAGNOSIS — I10 Essential (primary) hypertension: Secondary | ICD-10-CM

## 2017-01-28 NOTE — Telephone Encounter (Signed)
Last seen 06/16/16  MMM

## 2017-01-28 NOTE — Telephone Encounter (Signed)
Last refill without being seen 

## 2017-02-20 ENCOUNTER — Encounter: Payer: Self-pay | Admitting: Nurse Practitioner

## 2017-02-20 ENCOUNTER — Ambulatory Visit: Payer: 59 | Admitting: Nurse Practitioner

## 2017-02-20 ENCOUNTER — Ambulatory Visit (INDEPENDENT_AMBULATORY_CARE_PROVIDER_SITE_OTHER): Payer: 59

## 2017-02-20 VITALS — BP 129/86 | HR 89 | Temp 97.7°F | Ht 74.0 in | Wt 311.0 lb

## 2017-02-20 DIAGNOSIS — Z01818 Encounter for other preprocedural examination: Secondary | ICD-10-CM

## 2017-02-20 DIAGNOSIS — K219 Gastro-esophageal reflux disease without esophagitis: Secondary | ICD-10-CM

## 2017-02-20 DIAGNOSIS — Z6839 Body mass index (BMI) 39.0-39.9, adult: Secondary | ICD-10-CM | POA: Diagnosis not present

## 2017-02-20 DIAGNOSIS — I10 Essential (primary) hypertension: Secondary | ICD-10-CM | POA: Diagnosis not present

## 2017-02-20 DIAGNOSIS — F3341 Major depressive disorder, recurrent, in partial remission: Secondary | ICD-10-CM

## 2017-02-20 DIAGNOSIS — J4520 Mild intermittent asthma, uncomplicated: Secondary | ICD-10-CM | POA: Diagnosis not present

## 2017-02-20 DIAGNOSIS — E782 Mixed hyperlipidemia: Secondary | ICD-10-CM | POA: Diagnosis not present

## 2017-02-20 MED ORDER — ATORVASTATIN CALCIUM 40 MG PO TABS: ORAL_TABLET | ORAL | 1 refills | Status: DC

## 2017-02-20 MED ORDER — ESCITALOPRAM OXALATE 20 MG PO TABS: 20.0000 mg | ORAL_TABLET | Freq: Every day | ORAL | 1 refills | Status: DC

## 2017-02-20 MED ORDER — BUDESONIDE-FORMOTEROL FUMARATE 160-4.5 MCG/ACT IN AERO: 2.0000 | INHALATION_SPRAY | Freq: Two times a day (BID) | RESPIRATORY_TRACT | 2 refills | Status: DC

## 2017-02-20 MED ORDER — OMEPRAZOLE 40 MG PO CPDR: DELAYED_RELEASE_CAPSULE | ORAL | 1 refills | Status: DC

## 2017-02-20 MED ORDER — LOSARTAN POTASSIUM 100 MG PO TABS: 100.0000 mg | ORAL_TABLET | Freq: Every day | ORAL | 1 refills | Status: DC

## 2017-02-20 NOTE — Addendum Note (Signed)
Addended by: Chevis Pretty on: 02/20/2017 04:05 PM   Modules accepted: Orders

## 2017-02-20 NOTE — Progress Notes (Signed)
Subjective:    Patient ID: David Ortega, male    DOB: 02/22/1960, 57 y.o.   MRN: [id number]  HPI Patient comes in David Ortega for surgical clearance. He is scheduled to have a total knee replacement once he is medically cleared for surgery. He is doing well without complaints today.  Patient Active Problem List   Diagnosis Date Noted  . OSA (obstructive sleep apnea) Wear cpap most nights 02/12/2015  . Depression Currently on lexapro- is doing better since he has gone back to work 02/12/2015  . BMI 39.0-39.9,adult No recent weight chnage 02/12/2015  . Hyperlipidemia Does not watch diet 05/31/2012  . Hypertension NO c/o chest pain, sob or headache 05/31/2012  . GERD (gastroesophageal reflux disease) On prilosec daily 05/31/2012  . Asthma, chronic Uses symbicort inhaler- deneis SOB 05/31/2012     Review of Systems  Constitutional: Negative for activity change and appetite change.  HENT: Negative.   Eyes: Negative for pain.  Respiratory: Negative for shortness of breath.   Cardiovascular: Negative for chest pain, palpitations and leg swelling.  Gastrointestinal: Negative for abdominal pain.  Endocrine: Negative for polydipsia.  Genitourinary: Negative.   Skin: Negative for rash.  Neurological: Negative for dizziness, weakness and headaches.  Hematological: Does not bruise/bleed easily.  Psychiatric/Behavioral: Negative.   All other systems reviewed and are negative.      Objective:   Physical Exam  Constitutional: He is oriented to person, place, and time. He appears well-developed and well-nourished.  HENT:  Head: Normocephalic.  Right Ear: External ear normal.  Left Ear: External ear normal.  Nose: Nose normal.  Mouth/Throat: Oropharynx is clear and moist.  Eyes: EOM are normal. Pupils are equal, round, and reactive to light.  Neck: Normal range of motion. Neck supple. No JVD present. No thyromegaly present.  Cardiovascular: Normal rate, regular rhythm, normal  heart sounds and intact distal pulses. Exam reveals no gallop and no friction rub.  No murmur heard. Pulmonary/Chest: Effort normal. No respiratory distress. He has wheezes (faint exp wheezes bil lowe rlobes). He has no rales. He exhibits no tenderness.  Abdominal: Soft. Bowel sounds are normal. He exhibits no mass. There is no tenderness.  Genitourinary: Prostate normal and penis normal.  Musculoskeletal: He exhibits no edema.  Lymphadenopathy:    He has no cervical adenopathy.  Neurological: He is alert and oriented to person, place, and time. No cranial nerve deficit.  Skin: Skin is warm and dry.  Psychiatric: He has a normal mood and affect. His behavior is normal. Judgment and thought content normal.   BP 129/86   Pulse 89   Temp 97.7 F (36.5 C) (Oral)   Ht 6\' 2"  (1.88 m)   Wt (!) 311 lb (141.1 kg)   BMI 39.93 kg/m   ekg- NSR-David Hassell Done, FNP   Chest x ray- mild bronchitic changes-Preliminary reading by David Collum, FNP  [facility]       Assessment & Plan:  1. Preoperative clearance Medically cleared for surgery - EKG 12-Lead - DG Chest 2 View; Future  2. Essential hypertension Low sodium diet - losartan (COZAAR) 100 MG tablet; Take 1 tablet (100 mg total) by mouth daily.  Dispense: 90 tablet; Refill: 1  3. Gastroesophageal reflux disease without esophagitis Avoid spicy foods Do not eat 2 hours prior to bedtime - omeprazole (PRILOSEC) 40 MG capsule; TAKE 1 CAPSULE BY MOUTH ONCE DAILY NEED  APPT  FOR  REFILLS  Dispense: 90 capsule; Refill: 1  4. Mixed hyperlipidemia Low fat diet -  atorvastatin (LIPITOR) 40 MG tablet; TAKE 1 TABLET BY MOUTH ONCE DAILY -NEED  APPT  FOR  MORE  REFILLS-  Dispense: 90 tablet; Refill: 1  5. Recurrent major depressive disorder, in partial remission (HCC) Stress management - escitalopram (LEXAPRO) 20 MG tablet; Take 1 tablet (20 mg total) by mouth daily.  Dispense: 90 tablet; Refill: 1  6. BMI 39.0-39.9,adult Discussed diet and  exercise for person with BMI >25 Will recheck weight in 3-6 months   7. Asthma, chronic, mild intermittent, uncomplicated - budesonide-formoterol (SYMBICORT) 160-4.5 MCG/ACT inhaler; Inhale 2 puffs into the lungs 2 (two) times daily.  Dispense: 1 Inhaler; Refill: 2    Labs pending Health maintenance reviewed Diet and exercise encouraged Continue all meds Follow up  In 6 months   [place], FNP

## 2017-02-21 LAB — LIPID PANEL
CHOL/HDL RATIO: 3.3 ratio (ref 0.0–5.0)
CHOLESTEROL TOTAL: 170 mg/dL (ref 100–199)
HDL: 52 mg/dL (ref >39)
LDL Calculated: 69 mg/dL (ref 0–99)
Triglycerides: 244 mg/dL — ABNORMAL HIGH (ref 0–149)
VLDL Cholesterol Cal: 49 mg/dL — ABNORMAL HIGH (ref 5–40)

## 2017-02-21 LAB — CBC WITH DIFFERENTIAL/PLATELET
BASOS ABS: 0 10*3/uL (ref 0.0–0.2)
Basos: 0 %
EOS (ABSOLUTE): 0.3 10*3/uL (ref 0.0–0.4)
EOS: 3 %
HEMATOCRIT: 38.9 % (ref 37.5–51.0)
HEMOGLOBIN: 13.1 g/dL (ref 13.0–17.7)
Immature Grans (Abs): 0 10*3/uL (ref 0.0–0.1)
Immature Granulocytes: 0 %
LYMPHS: 29 %
Lymphocytes Absolute: 2.8 10*3/uL (ref 0.7–3.1)
MCH: 31.5 pg (ref 26.6–33.0)
MCHC: 33.7 g/dL (ref 31.5–35.7)
MCV: 94 fL (ref 79–97)
MONOCYTES: 6 %
Monocytes Absolute: 0.6 10*3/uL (ref 0.1–0.9)
NEUTROS ABS: 5.8 10*3/uL (ref 1.4–7.0)
Neutrophils: 62 %
Platelets: 236 10*3/uL (ref 150–379)
RBC: 4.16 x10E6/uL (ref 4.14–5.80)
RDW: 14.7 % (ref 12.3–15.4)
WBC: 9.5 10*3/uL (ref 3.4–10.8)

## 2017-02-21 LAB — CMP14+EGFR
A/G RATIO: 1.6 (ref 1.2–2.2)
ALK PHOS: 119 IU/L — AB (ref 39–117)
ALT: 28 IU/L (ref 0–44)
AST: 25 IU/L (ref 0–40)
Albumin: 4.2 g/dL (ref 3.5–5.5)
BILIRUBIN TOTAL: 0.4 mg/dL (ref 0.0–1.2)
BUN/Creatinine Ratio: 12 (ref 9–20)
BUN: 13 mg/dL (ref 6–24)
CHLORIDE: 104 mmol/L (ref 96–106)
CO2: 25 mmol/L (ref 20–29)
Calcium: 9.6 mg/dL (ref 8.7–10.2)
Creatinine, Ser: 1.05 mg/dL (ref 0.76–1.27)
GFR calc Af Amer: 91 mL/min/{1.73_m2} (ref >59)
GFR, EST NON AFRICAN AMERICAN: 78 mL/min/{1.73_m2} (ref >59)
GLOBULIN, TOTAL: 2.6 g/dL (ref 1.5–4.5)
Glucose: 102 mg/dL — ABNORMAL HIGH (ref 65–99)
POTASSIUM: 5 mmol/L (ref 3.5–5.2)
SODIUM: 144 mmol/L (ref 134–144)
Total Protein: 6.8 g/dL (ref 6.0–8.5)

## 2017-05-08 ENCOUNTER — Other Ambulatory Visit: Payer: Self-pay | Admitting: Nurse Practitioner

## 2017-05-08 DIAGNOSIS — F329 Major depressive disorder, single episode, unspecified: Secondary | ICD-10-CM

## 2017-05-08 DIAGNOSIS — F32A Depression, unspecified: Secondary | ICD-10-CM

## 2017-05-08 NOTE — Telephone Encounter (Signed)
Last seen 02/20/17  MMM 

## 2017-06-01 ENCOUNTER — Other Ambulatory Visit: Payer: Self-pay | Admitting: Nurse Practitioner

## 2017-06-01 DIAGNOSIS — J4520 Mild intermittent asthma, uncomplicated: Secondary | ICD-10-CM

## 2017-07-24 ENCOUNTER — Ambulatory Visit (INDEPENDENT_AMBULATORY_CARE_PROVIDER_SITE_OTHER): Payer: 59

## 2017-07-24 ENCOUNTER — Encounter: Payer: Self-pay | Admitting: Nurse Practitioner

## 2017-07-24 ENCOUNTER — Ambulatory Visit: Payer: 59 | Admitting: Nurse Practitioner

## 2017-07-24 VITALS — BP 125/81 | HR 77 | Temp 97.6°F | Ht 74.0 in | Wt 309.0 lb

## 2017-07-24 DIAGNOSIS — S43401A Unspecified sprain of right shoulder joint, initial encounter: Secondary | ICD-10-CM | POA: Diagnosis not present

## 2017-07-24 DIAGNOSIS — M25511 Pain in right shoulder: Secondary | ICD-10-CM | POA: Diagnosis not present

## 2017-07-24 NOTE — Progress Notes (Signed)
   Subjective:    Patient ID: David Ortega, male    DOB: [birth year], 57 y.o.   MRN: [id number]   Chief Complaint: Shoulder Pain (right. [address] out of porch swing)   HPI Patient was sitting on front porch swing and chain broke and he fell and hit right shoulder on porch. Painful to move shoulder since fell.   Review of Systems  Constitutional: Negative.   Respiratory: Negative.   Cardiovascular: Negative.   Gastrointestinal: Negative.   Musculoskeletal: Positive for arthralgias (right shoulder).  Neurological: Negative.   Psychiatric/Behavioral: Negative.   All other systems reviewed and are negative.      Objective:   Physical Exam  Constitutional: He is oriented to person, place, and time. He appears well-developed and well-nourished. He appears distressed (moderate).  Cardiovascular: Normal rate.  Pulmonary/Chest: Effort normal.  Musculoskeletal:  Decrease rom of right shoulder with pain on internal and external rotation with passive movement Active rom causes pain with extension  Neurological: He is alert and oriented to person, place, and time.  Skin: Skin is warm and dry.    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 [address] out of porch swing)   1. Acute pain of right shoulder - DG Shoulder Right; Future  2. Sprain of right shoulder, unspecified shoulder sprain type, initial encounter Motrin or tylenol OTC If no better or worsens may need MRI Ice bid RTO prn  David Hassell Done, FNP

## 2017-07-24 NOTE — Patient Instructions (Signed)
Shoulder Pain Many things can cause shoulder pain, including:  An injury to the area.  Overuse of the shoulder.  Arthritis.  The source of the pain can be:  Inflammation.  An injury to the shoulder joint.  An injury to a tendon, ligament, or bone.  Follow these instructions at home: Take these actions to help with your pain:  Squeeze a soft ball or a foam pad as much as possible. This helps to keep the shoulder from swelling. It also helps to strengthen the arm.  Take over-the-counter and prescription medicines only as told by your health care provider.  If directed, apply ice to the area: ? Put ice in a plastic bag. ? Place a towel between your skin and the bag. ? Leave the ice on for 20 minutes, 2-3 times per day. Stop applying ice if it does not help with the pain.  If you were given a shoulder sling or immobilizer: ? Wear it as told. ? Remove it to shower or bathe. ? Move your arm as little as possible, but keep your hand moving to prevent swelling.  Contact a health care provider if:  Your pain gets worse.  Your pain is not relieved with medicines.  New pain develops in your arm, hand, or fingers. Get help right away if:  Your arm, hand, or fingers: ? Tingle. ? Become numb. ? Become swollen. ? Become painful. ? Turn white or blue. This information is not intended to replace advice given to you by your health care provider. Make sure you discuss any questions you have with your health care provider. Document Released: 10/23/2004 Document Revised: 09/09/2015 Document Reviewed: 05/08/2014 Elsevier Interactive Patient Education  2018 [employer]  

## 2017-08-20 ENCOUNTER — Ambulatory Visit: Payer: 59 | Admitting: Nurse Practitioner

## 2017-08-30 ENCOUNTER — Other Ambulatory Visit: Payer: Self-pay | Admitting: Nurse Practitioner

## 2017-08-30 DIAGNOSIS — J4520 Mild intermittent asthma, uncomplicated: Secondary | ICD-10-CM

## 2017-09-15 ENCOUNTER — Ambulatory Visit: Payer: 59 | Admitting: Nurse Practitioner

## 2017-09-15 ENCOUNTER — Encounter: Payer: Self-pay | Admitting: Nurse Practitioner

## 2017-09-15 VITALS — BP 139/84 | HR 82 | Temp 97.9°F | Ht 74.0 in | Wt 312.0 lb

## 2017-09-15 DIAGNOSIS — G4733 Obstructive sleep apnea (adult) (pediatric): Secondary | ICD-10-CM

## 2017-09-15 DIAGNOSIS — F329 Major depressive disorder, single episode, unspecified: Secondary | ICD-10-CM

## 2017-09-15 DIAGNOSIS — Z6839 Body mass index (BMI) 39.0-39.9, adult: Secondary | ICD-10-CM

## 2017-09-15 DIAGNOSIS — E782 Mixed hyperlipidemia: Secondary | ICD-10-CM

## 2017-09-15 DIAGNOSIS — F3341 Major depressive disorder, recurrent, in partial remission: Secondary | ICD-10-CM

## 2017-09-15 DIAGNOSIS — F32A Depression, unspecified: Secondary | ICD-10-CM

## 2017-09-15 DIAGNOSIS — I10 Essential (primary) hypertension: Secondary | ICD-10-CM | POA: Diagnosis not present

## 2017-09-15 DIAGNOSIS — E291 Testicular hypofunction: Secondary | ICD-10-CM

## 2017-09-15 DIAGNOSIS — K219 Gastro-esophageal reflux disease without esophagitis: Secondary | ICD-10-CM | POA: Diagnosis not present

## 2017-09-15 DIAGNOSIS — J4520 Mild intermittent asthma, uncomplicated: Secondary | ICD-10-CM | POA: Diagnosis not present

## 2017-09-15 MED ORDER — TESTOSTERONE 50 MG/5GM (1%) TD GEL: TRANSDERMAL | 5 refills | Status: DC

## 2017-09-15 MED ORDER — BUDESONIDE-FORMOTEROL FUMARATE 160-4.5 MCG/ACT IN AERO: 2.0000 | INHALATION_SPRAY | Freq: Two times a day (BID) | RESPIRATORY_TRACT | 4 refills | Status: DC

## 2017-09-15 MED ORDER — ATORVASTATIN CALCIUM 40 MG PO TABS: ORAL_TABLET | ORAL | 1 refills | Status: DC

## 2017-09-15 MED ORDER — LOSARTAN POTASSIUM 100 MG PO TABS: 100.0000 mg | ORAL_TABLET | Freq: Every day | ORAL | 1 refills | Status: DC

## 2017-09-15 MED ORDER — BUPROPION HCL ER (XL) 150 MG PO TB24: 150.0000 mg | ORAL_TABLET | Freq: Every day | ORAL | 1 refills | Status: DC

## 2017-09-15 MED ORDER — OMEPRAZOLE 40 MG PO CPDR: DELAYED_RELEASE_CAPSULE | ORAL | 1 refills | Status: DC

## 2017-09-15 MED ORDER — ESCITALOPRAM OXALATE 20 MG PO TABS: 20.0000 mg | ORAL_TABLET | Freq: Every day | ORAL | 1 refills | Status: DC

## 2017-09-15 NOTE — Progress Notes (Signed)
Subjective:    Patient ID: David Ortega, male    DOB: 01-26-1961, 57 y.o.   MRN: [id number]   Chief Complaint: Medical Management of Chronic issues  HPI:  1. Essential hypertension  Npo c/p chest pain, sob or headache. Does not check blood pressure at home. BP Readings from Last 3 Encounters:  07/24/17 125/81  02/20/17 129/86  09/05/16 110/72     2. OSA (obstructive sleep apnea)  Does not wear CPAP machine because he cannot get it clean and it makes him sick.  3. Mild intermittent chronic asthma without complication  Has breathing issues sometimes but it is usually when he is outside.  4. Gastroesophageal reflux disease without esophagitis  On omeprazole daily to keep symptoms under control.  5. Mixed hyperlipidemia  Does not watch diet at all and does no exercise.  6. Recurrent major depressive disorder, in partial remission ([place]) depression come sand goes. He is currently doing well.  Depression screen [facility] 2/9 09/15/2017 07/24/2017 02/20/2017  Decreased Interest 1 0 2  Down, Depressed, Hopeless 0 0 0  PHQ - 2 Score 1 0 2  Altered sleeping - - 1  Tired, decreased energy - - 0  Change in appetite - - 0  Feeling bad or failure about yourself  - - 0  Trouble concentrating - - 0  Moving slowly or fidgety/restless - - 0  Suicidal thoughts - - 0  PHQ-9 Score - - 3  Difficult doing work/chores - - -     7. BMI 39.0-39.9,adult  Weight up 3 lbs. Since last visit    Outpatient Encounter Medications as of 09/15/2017  Medication Sig  . albuterol (PROVENTIL HFA;VENTOLIN HFA) 108 (90 Base) MCG/ACT inhaler INHALE 2 PUFFS BY MOUTH EVERY 6 HOURS AS NEEDED FOR WHEEZING OR  SHORTNESS  OF  BREATH  . aspirin 325 MG tablet Take 325 mg by mouth daily.  Marland Kitchen atorvastatin (LIPITOR) 40 MG tablet TAKE 1 TABLET BY MOUTH ONCE DAILY -NEED  APPT  FOR  MORE  REFILLS-  . buPROPion (WELLBUTRIN XL) 150 MG 24 hr tablet Take 1 tablet (150 mg total) by mouth daily. (Patient taking differently: Take 150  mg by mouth every evening. )  . buPROPion (WELLBUTRIN XL) 150 MG 24 hr tablet TAKE 1 TABLET BY MOUTH ONCE DAILY  . escitalopram (LEXAPRO) 20 MG tablet Take 1 tablet (20 mg total) by mouth daily.  Marland Kitchen losartan (COZAAR) 100 MG tablet Take 1 tablet (100 mg total) by mouth daily.  . meloxicam (MOBIC) 15 MG tablet   . omeprazole (PRILOSEC) 40 MG capsule TAKE 1 CAPSULE BY MOUTH ONCE DAILY NEED  APPT  FOR  REFILLS  . SYMBICORT 160-4.5 MCG/ACT inhaler INHALE 2 PUFFS BY MOUTH TWICE DAILY  . testosterone (ANDROGEL) 50 MG/5GM (1%) GEL Apply 1 5g package to shoulder daily (Patient taking differently: Place 5 g onto the skin daily. Apply 1 5g package to shoulder daily)  . 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  HENT: Negative.   Eyes: Negative for pain.  Respiratory: Negative for shortness of breath.   Cardiovascular: Negative for chest pain, palpitations and leg swelling.  Gastrointestinal: Negative for abdominal pain.  Endocrine: Negative for polydipsia.  Genitourinary: Negative.   Skin: Negative for rash.  Neurological: Negative for dizziness, weakness and headaches.  Hematological: Does not bruise/bleed easily.  Psychiatric/Behavioral: Negative.   All other systems reviewed and are negative.      Objective:   Physical Exam  Constitutional: He is oriented to person, place, and time. He appears well-developed and well-nourished. No distress.  HENT:  Head: Normocephalic.  Nose: Nose normal.  Mouth/Throat: Oropharynx is clear and moist.  Eyes: Pupils are equal, round, and reactive to light. EOM are normal.  Neck: Normal range of motion and phonation normal. Neck supple. No JVD present.  Carotid bruit is not present. No thyroid mass and no thyromegaly present.  Cardiovascular: Normal rate and regular rhythm.  Pulmonary/Chest: Effort normal and breath sounds normal. No respiratory distress.  Abdominal: Soft. Normal appearance, normal aorta and bowel sounds are normal. There is no tenderness.  Musculoskeletal: Normal range of motion.  Lymphadenopathy:    He has no cervical adenopathy.  Neurological: He is alert and oriented to person, place, and time.  Skin: Skin is warm and dry.  Psychiatric: He has a normal mood and affect. His behavior is normal. Judgment and thought content normal.   BP 139/84   Pulse 82   Temp 97.9 F (36.6 C) (Oral)   Ht 6' 2"  (1.88 m)   Wt (!) 312 lb (141.5 kg)   BMI 40.06 kg/m          Assessment & Plan:  David Ortega comes in today with chief complaint of No chief complaint on file.   Diagnosis and orders addressed:  1. Essential hypertension Low sodium diet - CMP14+EGFR - losartan (COZAAR) 100 MG tablet; Take 1 tablet (100 mg total) by mouth daily.  Dispense: 90 tablet; Refill: 1  2. OSA (obstructive sleep apnea) Needs to get something to clean machine  3. Mild intermittent chronic asthma without complication  4. Gastroesophageal reflux disease without esophagitis Avoid spicy foods Do not eat 2 hours prior to bedtime - omeprazole (PRILOSEC) 40 MG capsule; TAKE 1 CAPSULE BY MOUTH ONCE DAILY NEED  APPT  FOR  REFILLS  Dispense: 90 capsule; Refill: 1  5. Mixed hyperlipidemia Low fat diet - Lipid panel - atorvastatin (LIPITOR) 40 MG tablet; TAKE 1 TABLET BY MOUTH ONCE DAILY -NEED  APPT  FOR  MORE  REFILLS-  Dispense: 90 tablet; Refill: 1  6. Recurrent major depressive disorder, in partial remission (HCC) Stress management - escitalopram (LEXAPRO) 20 MG tablet; Take 1 tablet (20 mg total) by mouth daily.  Dispense: 90 tablet; Refill: 1  buPROPion (WELLBUTRIN XL) 150 MG 24 hr tablet; Take 1 tablet (150 mg total) by mouth  daily.  Dispense: 90 tablet; Refill: 1  7. BMI 39.0-39.9,adult Discussed diet and exercise for person with BMI >25 Will recheck weight in 3-6 months  8. Asthma, chronic, mild intermittent, uncomplicated - budesonide-formoterol (SYMBICORT) 160-4.5 MCG/ACT inhaler; Inhale 2 puffs into the lungs 2 (two) times daily.  Dispense: 10.2 g; Refill: 4   9. Hypogonadism in male - testosterone (ANDROGEL) 50 MG/5GM (1%) GEL; Apply 1 5g package to shoulder daily  Dispense: 30 Package; Refill: 5   Labs pending Health Maintenance reviewed Diet and exercise encouraged  Follow up plan: 6 months   Mary-Margaret Hassell Done, FNP

## 2017-09-21 NOTE — Progress Notes (Signed)
Left message with reminder.

## 2017-10-30 IMAGING — CR DG CHEST 2V
2 series · 2 of 2 positions shown · non-contrast
Comparison: 02/08/2013

CLINICAL DATA: Chronic asthma.

EXAM:
CHEST  2 VIEW

[view not recorded (1 of 2)]
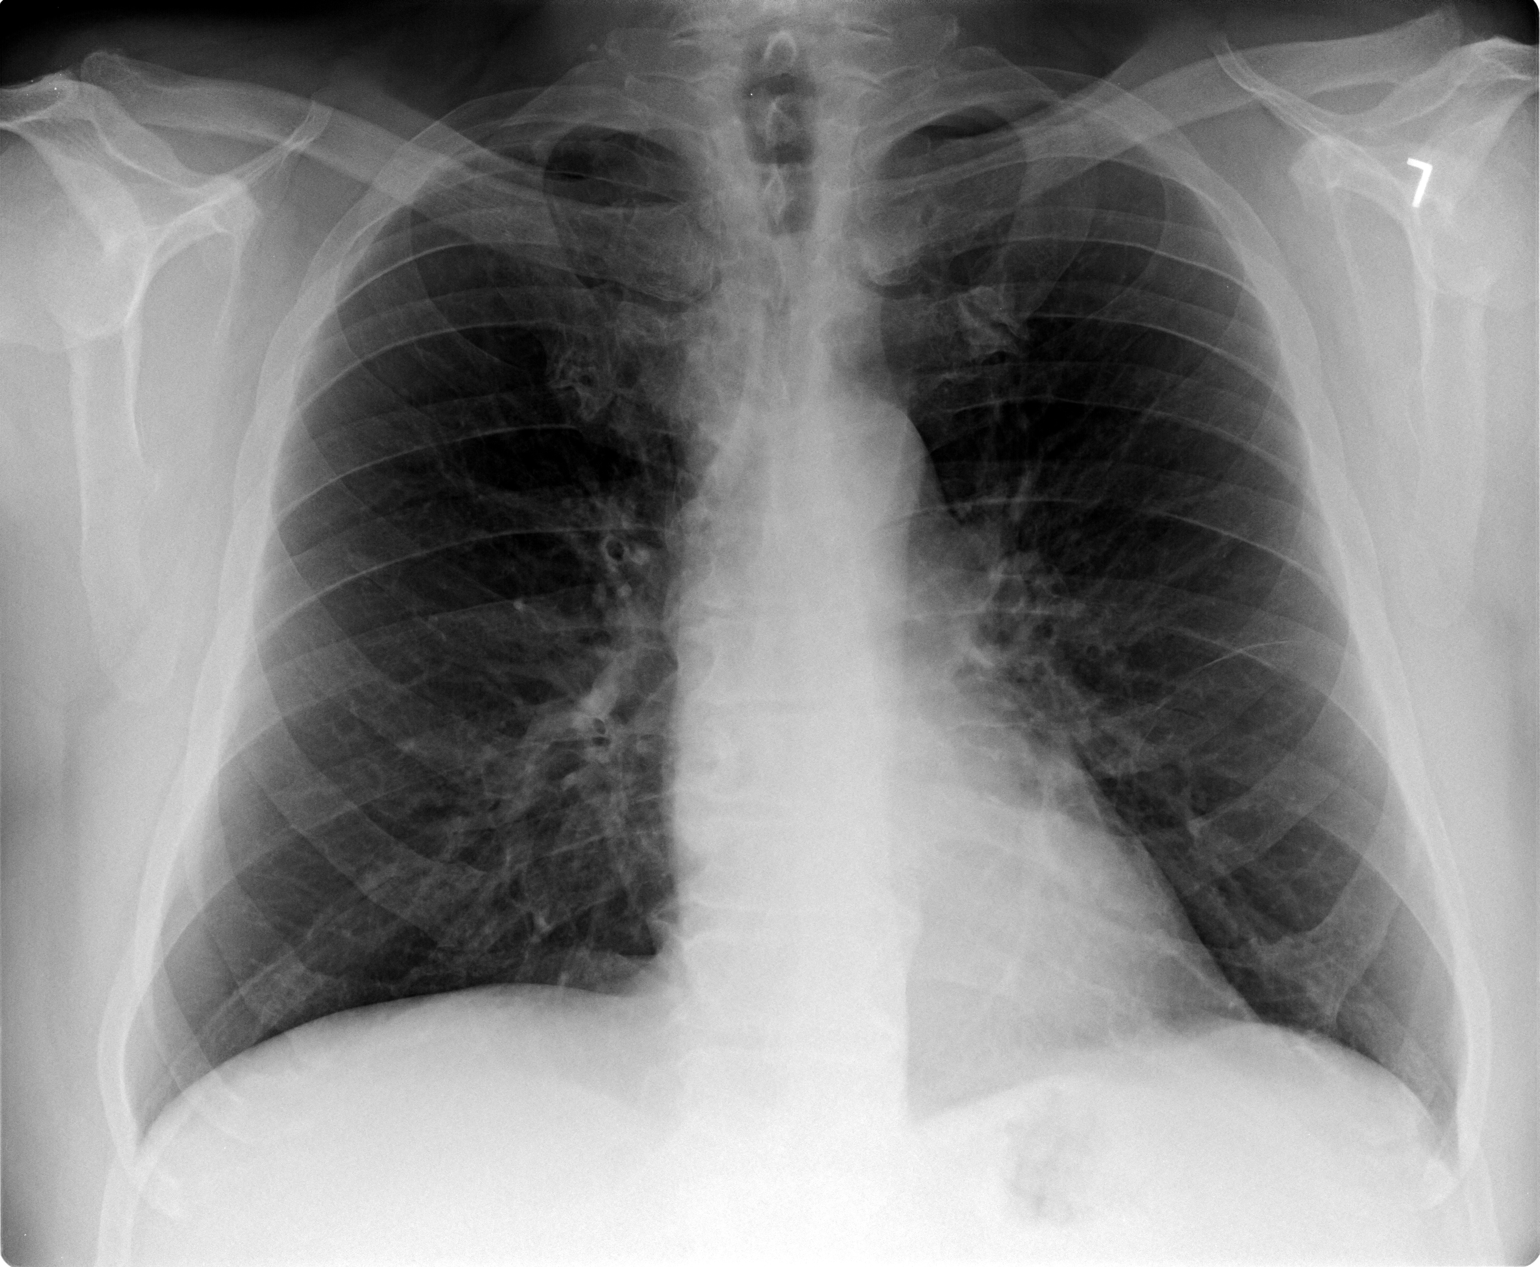

[view not recorded (2 of 2)]
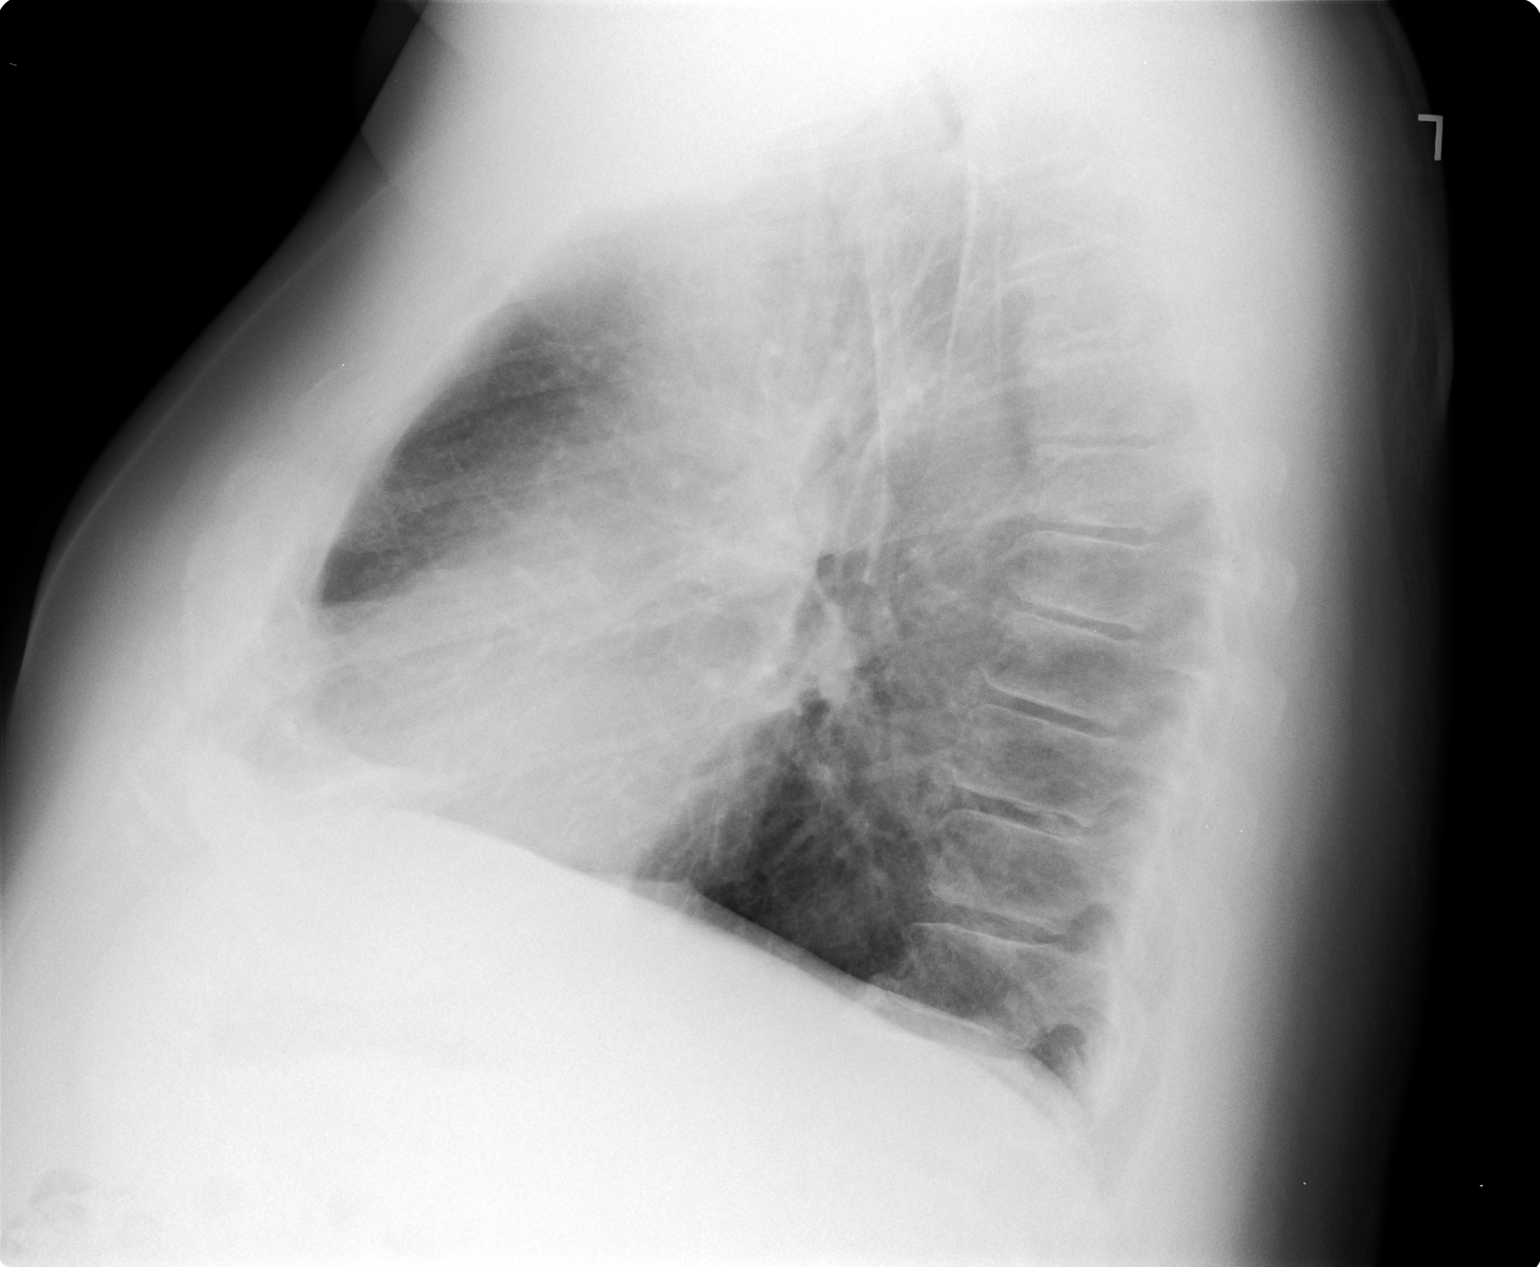

[2 of 2 positions shown; findings below may reference images not displayed]

FINDINGS: Airway thickening is present, suggesting bronchitis or reactive
airways disease. Cardiac and mediastinal margins appear normal. Mild
thoracic spondylosis. No pleural effusion.
IMPRESSION: 1. Airway thickening is present, suggesting bronchitis or reactive
airways disease.

## 2018-01-13 ENCOUNTER — Telehealth: Payer: Self-pay | Admitting: Nurse Practitioner

## 2018-01-13 MED ORDER — ALBUTEROL SULFATE HFA 108 (90 BASE) MCG/ACT IN AERS: INHALATION_SPRAY | RESPIRATORY_TRACT | 2 refills | Status: DC

## 2018-01-13 NOTE — Telephone Encounter (Signed)
LMOVM inhaler sent to pharmacy

## 2018-02-12 ENCOUNTER — Ambulatory Visit: Payer: 59 | Admitting: Nurse Practitioner

## 2018-02-12 ENCOUNTER — Encounter: Payer: Self-pay | Admitting: Nurse Practitioner

## 2018-02-12 VITALS — BP 139/85 | HR 82 | Temp 98.1°F

## 2018-02-12 DIAGNOSIS — J069 Acute upper respiratory infection, unspecified: Secondary | ICD-10-CM | POA: Diagnosis not present

## 2018-02-12 MED ORDER — AMOXICILLIN-POT CLAVULANATE 875-125 MG PO TABS: 1.0000 | ORAL_TABLET | Freq: Two times a day (BID) | ORAL | 0 refills | Status: DC

## 2018-02-12 NOTE — Patient Instructions (Signed)

## 2018-02-12 NOTE — Progress Notes (Signed)
Subjective:    Patient ID: David Ortega, male    DOB: 01-29-1960, 58 y.o.   MRN: [id number]   Chief Complaint: Cough and Nasal Congestion   HPI patient comes in today c/o cough  And congestion that started over 2 weeks ago. He has not been taking any meds. He feels a little worse today then he did when I started.   Review of Systems  Constitutional: Negative for chills and fever.  HENT: Positive for congestion, rhinorrhea and sinus pressure. Negative for sore throat and trouble swallowing.   Respiratory: Positive for cough (productive- dark yellow). Negative for shortness of breath.   Cardiovascular: Negative.   Neurological: Negative.   Psychiatric/Behavioral: Negative.   All other systems reviewed and are negative.      Objective:   Physical Exam Constitutional:      General: He is not in acute distress.    Appearance: Normal appearance. He is normal weight.  HENT:     Right Ear: Hearing, tympanic membrane, ear canal and external ear normal.     Left Ear: Hearing, tympanic membrane, ear canal and external ear normal.     Nose: Mucosal edema, congestion and rhinorrhea present.     Right Turbinates: Pale.     Left Turbinates: Pale.     Right Sinus: Maxillary sinus tenderness present. No frontal sinus tenderness.     Left Sinus: Maxillary sinus tenderness present.     Mouth/Throat:     Mouth: Mucous membranes are dry.     Pharynx: Posterior oropharyngeal erythema (mild) present.  Neck:     Musculoskeletal: Normal range of motion and neck supple.  Cardiovascular:     Rate and Rhythm: Normal rate and regular rhythm.     Heart sounds: Normal heart sounds.  Pulmonary:     Effort: Pulmonary effort is normal.     Breath sounds: Normal breath sounds. No wheezing.     Comments: Dry cough  Lymphadenopathy:     Cervical: No cervical adenopathy.  Skin:    General: Skin is warm and dry.  Neurological:     General: No focal deficit present.     Mental Status: He is  alert and oriented to person, place, and time.  Psychiatric:        Mood and Affect: Mood normal.        Behavior: Behavior normal.    BP 139/85   Pulse 82   Temp 98.1 F (36.7 C) (Oral)         Assessment & Plan:  [name] in today with chief complaint of Cough and Nasal Congestion   1. Upper respiratory infection with cough and congestion 1. Take meds as prescribed 2. Use a cool mist humidifier especially during the winter months and when heat has been humid. 3. Use saline nose sprays frequently 4. Saline irrigations of the nose can be very helpful if done frequently.  * 4X daily for 1 week*  * Use of a nettie pot can be helpful with this. Follow directions with this* 5. Drink plenty of fluids 6. Keep thermostat turn down low 7.For any cough or congestion  Use plain Mucinex- regular strength or max strength is fine   * Children- consult with Pharmacist for dosing 8. For fever or aces or pains- take tylenol or ibuprofen appropriate for age and weight.  * for fevers greater than 101 orally you may alternate ibuprofen and tylenol every  3 hours.    - amoxicillin-clavulanate (AUGMENTIN) 875-125  MG tablet; Take 1 tablet by mouth 2 (two) times daily.  Dispense: 14 tablet; Refill: 0   Mary-Margaret Hassell Done, FNP

## 2018-03-18 ENCOUNTER — Ambulatory Visit: Payer: 59 | Admitting: Nurse Practitioner

## 2018-05-11 ENCOUNTER — Other Ambulatory Visit: Payer: Self-pay | Admitting: Nurse Practitioner

## 2018-05-11 DIAGNOSIS — I10 Essential (primary) hypertension: Secondary | ICD-10-CM

## 2018-06-17 ENCOUNTER — Other Ambulatory Visit: Payer: Self-pay | Admitting: Nurse Practitioner

## 2018-06-17 ENCOUNTER — Other Ambulatory Visit: Payer: Self-pay | Admitting: *Deleted

## 2018-06-17 DIAGNOSIS — I10 Essential (primary) hypertension: Secondary | ICD-10-CM

## 2018-06-17 MED ORDER — LOSARTAN POTASSIUM 100 MG PO TABS: 100.0000 mg | ORAL_TABLET | Freq: Every day | ORAL | 0 refills | Status: DC

## 2018-06-17 NOTE — Telephone Encounter (Signed)
Aware - set up

## 2018-06-17 NOTE — Telephone Encounter (Signed)
MMM. NTBS 30 days given 05/11/18

## 2018-06-28 ENCOUNTER — Other Ambulatory Visit: Payer: Self-pay

## 2018-06-29 ENCOUNTER — Encounter: Payer: Self-pay | Admitting: Nurse Practitioner

## 2018-06-29 ENCOUNTER — Ambulatory Visit: Payer: 59 | Admitting: Nurse Practitioner

## 2018-06-29 VITALS — BP 133/78 | HR 81 | Temp 98.0°F | Ht 74.0 in | Wt 319.8 lb

## 2018-06-29 DIAGNOSIS — E782 Mixed hyperlipidemia: Secondary | ICD-10-CM | POA: Diagnosis not present

## 2018-06-29 DIAGNOSIS — K219 Gastro-esophageal reflux disease without esophagitis: Secondary | ICD-10-CM | POA: Diagnosis not present

## 2018-06-29 DIAGNOSIS — G4733 Obstructive sleep apnea (adult) (pediatric): Secondary | ICD-10-CM

## 2018-06-29 DIAGNOSIS — I10 Essential (primary) hypertension: Secondary | ICD-10-CM | POA: Diagnosis not present

## 2018-06-29 DIAGNOSIS — Z6839 Body mass index (BMI) 39.0-39.9, adult: Secondary | ICD-10-CM

## 2018-06-29 DIAGNOSIS — F3341 Major depressive disorder, recurrent, in partial remission: Secondary | ICD-10-CM

## 2018-06-29 DIAGNOSIS — F32A Depression, unspecified: Secondary | ICD-10-CM

## 2018-06-29 DIAGNOSIS — J4520 Mild intermittent asthma, uncomplicated: Secondary | ICD-10-CM

## 2018-06-29 DIAGNOSIS — F329 Major depressive disorder, single episode, unspecified: Secondary | ICD-10-CM

## 2018-06-29 MED ORDER — BUPROPION HCL ER (XL) 150 MG PO TB24: 150.0000 mg | ORAL_TABLET | Freq: Every day | ORAL | 1 refills | Status: DC

## 2018-06-29 MED ORDER — BUDESONIDE-FORMOTEROL FUMARATE 160-4.5 MCG/ACT IN AERO: 2.0000 | INHALATION_SPRAY | Freq: Two times a day (BID) | RESPIRATORY_TRACT | 4 refills | Status: DC

## 2018-06-29 MED ORDER — ATORVASTATIN CALCIUM 40 MG PO TABS: ORAL_TABLET | ORAL | 1 refills | Status: DC

## 2018-06-29 MED ORDER — ESCITALOPRAM OXALATE 20 MG PO TABS: 20.0000 mg | ORAL_TABLET | Freq: Every day | ORAL | 1 refills | Status: DC

## 2018-06-29 MED ORDER — OMEPRAZOLE 40 MG PO CPDR: DELAYED_RELEASE_CAPSULE | ORAL | 1 refills | Status: DC

## 2018-06-29 MED ORDER — LOSARTAN POTASSIUM 100 MG PO TABS: 100.0000 mg | ORAL_TABLET | Freq: Every day | ORAL | 1 refills | Status: DC

## 2018-06-29 NOTE — Progress Notes (Signed)
Subjective:    Patient ID: David Ortega, male    DOB: July 22, 1960, 58 y.o.   MRN: [id number]   Chief Complaint: Medical Management of Chronic Issues    HPI:  1. Essential hypertension No c/o chest pain, sob or headache. Does not check blood pressure at home. BP Readings from Last 3 Encounters:  06/29/18 133/78  02/12/18 139/85  09/15/17 139/84      2. Mixed hyperlipidemia Does not watch diet and does no exercsie  3. Gastroesophageal reflux disease without esophagitis Is on omeprazole dialy and works well to keep symptoms under control.  4. OSA (obstructive sleep apnea) Wears CPAP nightly- feels rested in morning  5. Recurrent major depressive disorder, in partial remission ([place]) Is on lexapro and wellbutrin and is doing well. Depression screen [facility] 2/9 06/29/2018 02/12/2018 09/15/2017  Decreased Interest 0 1 1  Down, Depressed, Hopeless 0 0 0  PHQ - 2 Score 0 1 1  Altered sleeping - - -  Tired, decreased energy - - -  Change in appetite - - -  Feeling bad or failure about yourself  - - -  Trouble concentrating - - -  Moving slowly or fidgety/restless - - -  Suicidal thoughts - - -  PHQ-9 Score - - -  Difficult doing work/chores - - -     6. BMI 39.0-39.9,adult Weight is up 6lbs from last visit   Outpatient Encounter Medications as of 06/29/2018  Medication Sig  . albuterol (PROVENTIL HFA;VENTOLIN HFA) 108 (90 Base) MCG/ACT inhaler INHALE 2 PUFFS BY MOUTH EVERY 6 HOURS AS NEEDED FOR WHEEZING OR  SHORTNESS  OF  BREATH  . aspirin 325 MG tablet Take 325 mg by mouth daily.  Marland Kitchen atorvastatin (LIPITOR) 40 MG tablet TAKE 1 TABLET BY MOUTH ONCE DAILY -NEED  APPT  FOR  MORE  REFILLS-  . budesonide-formoterol (SYMBICORT) 160-4.5 MCG/ACT inhaler Inhale 2 puffs into the lungs 2 (two) times daily.  Marland Kitchen buPROPion (WELLBUTRIN XL) 150 MG 24 hr tablet Take 1 tablet (150 mg total) by mouth daily.  Marland Kitchen escitalopram (LEXAPRO) 20 MG tablet Take 1 tablet (20 mg total) by mouth daily.  Marland Kitchen  losartan (COZAAR) 100 MG tablet Take 1 tablet (100 mg total) by mouth daily. (Needs to be seen before next refill)  . meloxicam (MOBIC) 15 MG tablet   . omeprazole (PRILOSEC) 40 MG capsule TAKE 1 CAPSULE BY MOUTH ONCE DAILY NEED  APPT  FOR  REFILLS  . testosterone (ANDROGEL) 50 MG/5GM (1%) GEL Apply 1 5g package to shoulder daily  . triamcinolone cream (KENALOG) 0.1 % APPLY ONE APPLICATION TWICE DAILY (Patient taking differently: APPLY ONE APPLICATION TWICE DAILY AS NEEDED FOR SKIN IRRITATION.)       New complaints: None today  Social history: Is a truck driver     Review of Systems  Constitutional: Negative for activity change and appetite change.  HENT: Negative.   Eyes: Negative for pain.  Respiratory: Negative for shortness of breath.   Cardiovascular: Negative for chest pain, palpitations and leg swelling.  Gastrointestinal: Negative for abdominal pain.  Endocrine: Negative for polydipsia.  Genitourinary: Negative.   Skin: Negative for rash.  Neurological: Negative for dizziness, weakness and headaches.  Hematological: Does not bruise/bleed easily.  Psychiatric/Behavioral: Negative.   All other systems reviewed and are negative.      Objective:   Physical Exam Vitals signs and nursing note reviewed.  Constitutional:      Appearance: Normal appearance. He is well-developed.  HENT:  Head: Normocephalic.     Nose: Nose normal.  Eyes:     Pupils: Pupils are equal, round, and reactive to light.  Neck:     Musculoskeletal: Normal range of motion and neck supple.     Thyroid: No thyroid mass or thyromegaly.     Vascular: No carotid bruit or JVD.     Trachea: Phonation normal.  Cardiovascular:     Rate and Rhythm: Normal rate and regular rhythm.  Pulmonary:     Effort: Pulmonary effort is normal. No respiratory distress.     Breath sounds: Normal breath sounds.  Abdominal:     General: Bowel sounds are normal.     Palpations: Abdomen is soft.     Tenderness:  There is no abdominal tenderness.  Musculoskeletal: Normal range of motion.     Right lower leg: Edema (1+) present.     Left lower leg: Edema (1+) present.  Lymphadenopathy:     Cervical: No cervical adenopathy.  Skin:    General: Skin is warm and dry.  Neurological:     Mental Status: He is alert and oriented to person, place, and time.  Psychiatric:        Behavior: Behavior normal.        Thought Content: Thought content normal.        Judgment: Judgment normal.    BP 133/78   Pulse 81   Temp 98 F (36.7 C) (Oral)   Ht 6' 2"  (1.88 m)   Wt (!) 319 lb 12.8 oz (145.1 kg)   BMI 41.06 kg/m         Assessment & Plan:  UNDREA SHIPES comes in today with chief complaint of Medical Management of Chronic Issues   Diagnosis and orders addressed:  1. Essential hypertension Low sodium diet - losartan (COZAAR) 100 MG tablet; Take 1 tablet (100 mg total) by mouth daily. (Needs to be seen before next refill)  Dispense: 90 tablet; Refill: 1 - CMP14+EGFR  2. Mixed hyperlipidemia Low fat diet - atorvastatin (LIPITOR) 40 MG tablet; TAKE 1 TABLET BY MOUTH ONCE DAILY -NEED  APPT  FOR  MORE  REFILLS-  Dispense: 90 tablet; Refill: 1 - Lipid panel  3. Gastroesophageal reflux disease without esophagitis Avoid spicy foods Do not eat 2 hours prior to bedtime - omeprazole (PRILOSEC) 40 MG capsule; TAKE 1 CAPSULE BY MOUTH ONCE DAILY NEED  APPT  FOR  REFILLS  Dispense: 90 capsule; Refill: 1  4. OSA (obstructive sleep apnea) Continue to wear CPAP nightly  5. Recurrent major depressive disorder, in partial remission (HCC) Stress management - escitalopram (LEXAPRO) 20 MG tablet; Take 1 tablet (20 mg total) by mouth daily.  Dispense: 90 tablet; Refill: 1 buPROPion (WELLBUTRIN XL) 150 MG 24 hr tablet; Take 1 tablet (150 mg total) by mouth daily.  Dispense: 90 tablet; Refill: 1  6. BMI 39.0-39.9,adult Discussed diet and exercise for person with BMI >25 Will recheck weight in 3-6 months   7. Asthma, chronic, mild intermittent, uncomplicated Avoid cigatette smoke - budesonide-formoterol (SYMBICORT) 160-4.5 MCG/ACT inhaler; Inhale 2 puffs into the lungs 2 (two) times daily.  Dispense: 10.2 g; Refill: 4   Labs pending Health Maintenance reviewed Diet and exercise encouraged  Follow up plan: 6 months   Mary-Margaret Hassell Done, FNP

## 2018-06-29 NOTE — Patient Instructions (Signed)
DASH Eating Plan  DASH stands for "Dietary Approaches to Stop Hypertension." The DASH eating plan is a healthy eating plan that has been shown to reduce high blood pressure (hypertension). It may also reduce your risk for type 2 diabetes, heart disease, and stroke. The DASH eating plan may also help with weight loss.  What are tips for following this plan?    General guidelines   Avoid eating more than 2,300 mg (milligrams) of salt (sodium) a day. If you have hypertension, you may need to reduce your sodium intake to 1,500 mg a day.   Limit alcohol intake to no more than 1 drink a day for nonpregnant women and 2 drinks a day for men. One drink equals 12 oz of beer, 5 oz of wine, or 1 oz of hard liquor.   Work with your health care provider to maintain a healthy body weight or to lose weight. Ask what an ideal weight is for you.   Get at least 30 minutes of exercise that causes your heart to beat faster (aerobic exercise) most days of the week. Activities may include walking, swimming, or biking.   Work with your health care provider or diet and nutrition specialist (dietitian) to adjust your eating plan to your individual calorie needs.  Reading food labels     Check food labels for the amount of sodium per serving. Choose foods with less than 5 percent of the Daily Value of sodium. Generally, foods with less than 300 mg of sodium per serving fit into this eating plan.   To find whole grains, look for the word "whole" as the first word in the ingredient list.  Shopping   Buy products labeled as "low-sodium" or "no salt added."   Buy fresh foods. Avoid canned foods and premade or frozen meals.  Cooking   Avoid adding salt when cooking. Use salt-free seasonings or herbs instead of table salt or sea salt. Check with your health care provider or pharmacist before using salt substitutes.   Do not fry foods. Cook foods using healthy methods such as baking, boiling, grilling, and broiling instead.   Cook with  heart-healthy oils, such as olive, canola, soybean, or sunflower oil.  Meal planning   Eat a balanced diet that includes:  ? 5 or more servings of fruits and vegetables each day. At each meal, try to fill half of your plate with fruits and vegetables.  ? Up to 6-8 servings of whole grains each day.  ? Less than 6 oz of lean meat, poultry, or fish each day. A 3-oz serving of meat is about the same size as a deck of cards. One egg equals 1 oz.  ? 2 servings of low-fat dairy each day.  ? A serving of nuts, seeds, or beans 5 times each week.  ? Heart-healthy fats. Healthy fats called Omega-3 fatty acids are found in foods such as flaxseeds and coldwater fish, like sardines, salmon, and mackerel.   Limit how much you eat of the following:  ? Canned or prepackaged foods.  ? Food that is high in trans fat, such as fried foods.  ? Food that is high in saturated fat, such as fatty meat.  ? Sweets, desserts, sugary drinks, and other foods with added sugar.  ? Full-fat dairy products.   Do not salt foods before eating.   Try to eat at least 2 vegetarian meals each week.   Eat more home-cooked food and less restaurant, buffet, and fast food.     When eating at a restaurant, ask that your food be prepared with less salt or no salt, if possible.  What foods are recommended?  The items listed may not be a complete list. Talk with your dietitian about what dietary choices are best for you.  Grains  Whole-grain or whole-wheat bread. Whole-grain or whole-wheat pasta. Brown rice. Oatmeal. Quinoa. Bulgur. Whole-grain and low-sodium cereals. Pita bread. Low-fat, low-sodium crackers. Whole-wheat flour tortillas.  Vegetables  Fresh or frozen vegetables (raw, steamed, roasted, or grilled). Low-sodium or reduced-sodium tomato and vegetable juice. Low-sodium or reduced-sodium tomato sauce and tomato paste. Low-sodium or reduced-sodium canned vegetables.  Fruits  All fresh, dried, or frozen fruit. Canned fruit in natural juice (without  added sugar).  Meat and other protein foods  Skinless chicken or turkey. Ground chicken or turkey. Pork with fat trimmed off. Fish and seafood. Egg whites. Dried beans, peas, or lentils. Unsalted nuts, nut butters, and seeds. Unsalted canned beans. Lean cuts of beef with fat trimmed off. Low-sodium, lean deli meat.  Dairy  Low-fat (1%) or fat-free (skim) milk. Fat-free, low-fat, or reduced-fat cheeses. Nonfat, low-sodium ricotta or cottage cheese. Low-fat or nonfat yogurt. Low-fat, low-sodium cheese.  Fats and oils  Soft margarine without trans fats. Vegetable oil. Low-fat, reduced-fat, or light mayonnaise and salad dressings (reduced-sodium). Canola, safflower, olive, soybean, and sunflower oils. Avocado.  Seasoning and other foods  Herbs. Spices. Seasoning mixes without salt. Unsalted popcorn and pretzels. Fat-free sweets.  What foods are not recommended?  The items listed may not be a complete list. Talk with your dietitian about what dietary choices are best for you.  Grains  Baked goods made with fat, such as croissants, muffins, or some breads. Dry pasta or rice meal packs.  Vegetables  Creamed or fried vegetables. Vegetables in a cheese sauce. Regular canned vegetables (not low-sodium or reduced-sodium). Regular canned tomato sauce and paste (not low-sodium or reduced-sodium). Regular tomato and vegetable juice (not low-sodium or reduced-sodium). Pickles. Olives.  Fruits  Canned fruit in a light or heavy syrup. Fried fruit. Fruit in cream or butter sauce.  Meat and other protein foods  Fatty cuts of meat. Ribs. Fried meat. Bacon. Sausage. Bologna and other processed lunch meats. Salami. Fatback. Hotdogs. Bratwurst. Salted nuts and seeds. Canned beans with added salt. Canned or smoked fish. Whole eggs or egg yolks. Chicken or turkey with skin.  Dairy  Whole or 2% milk, cream, and half-and-half. Whole or full-fat cream cheese. Whole-fat or sweetened yogurt. Full-fat cheese. Nondairy creamers. Whipped toppings.  Processed cheese and cheese spreads.  Fats and oils  Butter. Stick margarine. Lard. Shortening. Ghee. Bacon fat. Tropical oils, such as coconut, palm kernel, or palm oil.  Seasoning and other foods  Salted popcorn and pretzels. Onion salt, garlic salt, seasoned salt, table salt, and sea salt. Worcestershire sauce. Tartar sauce. Barbecue sauce. Teriyaki sauce. Soy sauce, including reduced-sodium. Steak sauce. Canned and packaged gravies. Fish sauce. Oyster sauce. Cocktail sauce. Horseradish that you find on the shelf. Ketchup. Mustard. Meat flavorings and tenderizers. Bouillon cubes. Hot sauce and Tabasco sauce. Premade or packaged marinades. Premade or packaged taco seasonings. Relishes. Regular salad dressings.  Where to find more information:   National Heart, Lung, and Blood Institute: www.nhlbi.nih.gov   American Heart Association: www.heart.org  Summary   The DASH eating plan is a healthy eating plan that has been shown to reduce high blood pressure (hypertension). It may also reduce your risk for type 2 diabetes, heart disease, and stroke.   With the   DASH eating plan, you should limit salt (sodium) intake to 2,300 mg a day. If you have hypertension, you may need to reduce your sodium intake to 1,500 mg a day.   When on the DASH eating plan, aim to eat more fresh fruits and vegetables, whole grains, lean proteins, low-fat dairy, and heart-healthy fats.   Work with your health care provider or diet and nutrition specialist (dietitian) to adjust your eating plan to your individual calorie needs.  This information is not intended to replace advice given to you by your health care provider. Make sure you discuss any questions you have with your health care provider.  Document Released: 01/02/2011 Document Revised: 01/07/2016 Document Reviewed: 01/07/2016  Elsevier Interactive Patient Education  2019 [employer] Inc[employer]

## 2018-06-30 LAB — CMP14+EGFR
ALT: 41 IU/L (ref 0–44)
AST: 31 IU/L (ref 0–40)
Albumin/Globulin Ratio: 1.8 (ref 1.2–2.2)
Albumin: 4.2 g/dL (ref 3.8–4.9)
Alkaline Phosphatase: 113 IU/L (ref 39–117)
BUN/Creatinine Ratio: 15 (ref 9–20)
BUN: 16 mg/dL (ref 6–24)
Bilirubin Total: 0.6 mg/dL (ref 0.0–1.2)
CO2: 23 mmol/L (ref 20–29)
Calcium: 9.4 mg/dL (ref 8.7–10.2)
Chloride: 105 mmol/L (ref 96–106)
Creatinine, Ser: 1.07 mg/dL (ref 0.76–1.27)
GFR calc Af Amer: 87 mL/min/{1.73_m2} (ref >59)
GFR calc non Af Amer: 76 mL/min/{1.73_m2} (ref >59)
Globulin, Total: 2.3 g/dL (ref 1.5–4.5)
Glucose: 117 mg/dL — ABNORMAL HIGH (ref 65–99)
Potassium: 5.2 mmol/L (ref 3.5–5.2)
Sodium: 142 mmol/L (ref 134–144)
Total Protein: 6.5 g/dL (ref 6.0–8.5)

## 2018-06-30 LAB — LIPID PANEL
Chol/HDL Ratio: 4 ratio (ref 0.0–5.0)
Cholesterol, Total: 212 mg/dL — ABNORMAL HIGH (ref 100–199)
HDL: 53 mg/dL (ref >39)
LDL Calculated: 121 mg/dL — ABNORMAL HIGH (ref 0–99)
Triglycerides: 191 mg/dL — ABNORMAL HIGH (ref 0–149)
VLDL Cholesterol Cal: 38 mg/dL (ref 5–40)

## 2018-12-10 ENCOUNTER — Other Ambulatory Visit: Payer: Self-pay

## 2018-12-10 ENCOUNTER — Encounter: Payer: Self-pay | Admitting: Nurse Practitioner

## 2018-12-10 ENCOUNTER — Ambulatory Visit (INDEPENDENT_AMBULATORY_CARE_PROVIDER_SITE_OTHER): Payer: 59 | Admitting: Nurse Practitioner

## 2018-12-10 DIAGNOSIS — R5383 Other fatigue: Secondary | ICD-10-CM | POA: Diagnosis not present

## 2018-12-10 DIAGNOSIS — K591 Functional diarrhea: Secondary | ICD-10-CM

## 2018-12-10 DIAGNOSIS — Z20822 Contact with and (suspected) exposure to covid-19: Secondary | ICD-10-CM

## 2018-12-10 DIAGNOSIS — R519 Headache, unspecified: Secondary | ICD-10-CM

## 2018-12-10 DIAGNOSIS — Z20828 Contact with and (suspected) exposure to other viral communicable diseases: Secondary | ICD-10-CM

## 2018-12-10 DIAGNOSIS — R52 Pain, unspecified: Secondary | ICD-10-CM | POA: Diagnosis not present

## 2018-12-10 NOTE — Progress Notes (Signed)
   Virtual Visit via telephone Note Due to COVID-19 pandemic this visit was conducted virtually. This visit type was conducted due to national recommendations for restrictions regarding the COVID-19 Pandemic (e.g. social distancing, sheltering in place) in an effort to limit this patient's exposure and mitigate transmission in our community. All issues noted in this document were discussed and addressed.  A physical exam was not performed with this format.  I connected with David Ortega on 12/10/18 at 2:45 by telephone and verified that I am speaking with the correct person using two identifiers. David Ortega is currently located at home and no one is currently with him during visit. The provider, Mary-Margaret Hassell Done, FNP is located in their office at time of visit.  I discussed the limitations, risks, security and privacy concerns of performing an evaluation and management service by telephone and the availability of in person appointments. I also discussed with the patient that there may be a patient responsible charge related to this service. The patient expressed understanding and agreed to proceed.   History and Present Illness:   Chief Complaint: Diarrhea and Headache   HPI Patient calls in for a visit today c/o headache since Wednesday, slight stomach discomfort with diarrhea. Also feels very fatigued.    Review of Systems  Constitutional: Positive for malaise/fatigue. Negative for chills and fever.  Gastrointestinal: Positive for diarrhea.  Neurological: Positive for dizziness and headaches.  All other systems reviewed and are negative.    Observations/Objective: Alert and oriented- answers all questions appropriately No distress    Assessment and Plan: David Ortega in today with chief complaint of Diarrhea and Headache   1. Acute nonintractable headache, unspecified headache type  2. Body aches  3. Fatigue, unspecified type  4. Functional diarrhea  5. Encounter by telehealth for suspected COVID-19 Patient was told needed covid testing- options for testing given Imodium AD OTC for diarrhea forc fluids Bland diet Tylenol OTC for hradache and or fever Quarantine until test results are back  Follow Up Instructions: prn    I discussed the assessment and treatment plan with the patient. The patient was provided an opportunity to ask questions and all were answered. The patient agreed with the plan and demonstrated an understanding of the instructions.   The patient was advised to call back or seek an in-person evaluation if the symptoms worsen or if the condition fails to improve as anticipated.  The above assessment and management plan was discussed with the patient. The patient verbalized understanding of and has agreed to the management plan. Patient is aware to call the clinic if symptoms persist or worsen. Patient is aware when to return to the clinic for a follow-up visit. Patient educated on when it is appropriate to go to the emergency department.   Time call ended:  2:56  I provided 11 minutes of non-face-to-face time during this encounter.    Mary-Margaret Hassell Done, FNP

## 2018-12-19 ENCOUNTER — Other Ambulatory Visit: Payer: Self-pay | Admitting: Nurse Practitioner

## 2018-12-19 DIAGNOSIS — J4520 Mild intermittent asthma, uncomplicated: Secondary | ICD-10-CM

## 2018-12-28 ENCOUNTER — Other Ambulatory Visit: Payer: Self-pay

## 2018-12-29 ENCOUNTER — Ambulatory Visit (INDEPENDENT_AMBULATORY_CARE_PROVIDER_SITE_OTHER): Payer: 59 | Admitting: Nurse Practitioner

## 2018-12-29 ENCOUNTER — Encounter: Payer: Self-pay | Admitting: Nurse Practitioner

## 2018-12-29 VITALS — BP 130/86 | HR 81 | Temp 98.6°F | Resp 20 | Ht 74.0 in | Wt 317.0 lb

## 2018-12-29 DIAGNOSIS — I10 Essential (primary) hypertension: Secondary | ICD-10-CM

## 2018-12-29 DIAGNOSIS — E782 Mixed hyperlipidemia: Secondary | ICD-10-CM

## 2018-12-29 DIAGNOSIS — F32A Depression, unspecified: Secondary | ICD-10-CM

## 2018-12-29 DIAGNOSIS — K219 Gastro-esophageal reflux disease without esophagitis: Secondary | ICD-10-CM

## 2018-12-29 DIAGNOSIS — J4520 Mild intermittent asthma, uncomplicated: Secondary | ICD-10-CM | POA: Diagnosis not present

## 2018-12-29 DIAGNOSIS — G4733 Obstructive sleep apnea (adult) (pediatric): Secondary | ICD-10-CM

## 2018-12-29 DIAGNOSIS — Z6839 Body mass index (BMI) 39.0-39.9, adult: Secondary | ICD-10-CM

## 2018-12-29 DIAGNOSIS — F3341 Major depressive disorder, recurrent, in partial remission: Secondary | ICD-10-CM

## 2018-12-29 DIAGNOSIS — F329 Major depressive disorder, single episode, unspecified: Secondary | ICD-10-CM

## 2018-12-29 MED ORDER — BUPROPION HCL ER (XL) 300 MG PO TB24: 300.0000 mg | ORAL_TABLET | Freq: Every day | ORAL | 1 refills | Status: DC

## 2018-12-29 MED ORDER — ATORVASTATIN CALCIUM 40 MG PO TABS: ORAL_TABLET | ORAL | 1 refills | Status: DC

## 2018-12-29 MED ORDER — ESCITALOPRAM OXALATE 20 MG PO TABS: 20.0000 mg | ORAL_TABLET | Freq: Every day | ORAL | 1 refills | Status: DC

## 2018-12-29 MED ORDER — SYMBICORT 160-4.5 MCG/ACT IN AERO: INHALATION_SPRAY | RESPIRATORY_TRACT | 5 refills | Status: DC

## 2018-12-29 MED ORDER — OMEPRAZOLE 40 MG PO CPDR: DELAYED_RELEASE_CAPSULE | ORAL | 1 refills | Status: DC

## 2018-12-29 MED ORDER — LOSARTAN POTASSIUM 100 MG PO TABS: 100.0000 mg | ORAL_TABLET | Freq: Every day | ORAL | 1 refills | Status: DC

## 2018-12-29 NOTE — Patient Instructions (Signed)

## 2018-12-29 NOTE — Progress Notes (Signed)
Subjective:    Patient ID: David Ortega, male    DOB: July 16, 1960, 58 y.o.   MRN: [id number]   Chief Complaint: Medical Management of Chronic Issues    HPI:  1. Essential hypertension No c/o chest pain, sob or headachce. Does not check blood pressre at h ome. BP Readings from Last 3 Encounters:  06/29/18 133/78  02/12/18 139/85  09/15/17 139/84     2. Mixed hyperlipidemia Does not watch diet and does little to no exercise. Lab Results  Component Value Date   CHOL 212 (H) 06/29/2018   HDL 53 06/29/2018   LDLCALC 121 (H) 06/29/2018   TRIG 191 (H) 06/29/2018   CHOLHDL 4.0 06/29/2018     3. Mild intermittent chronic asthma without complication He has been doing well the last few  Months. He has been using his symbicort inhaler daily.  4. OSA (obstructive sleep apnea) Does not wear  CPAP nightly.he says everytime he wears it he gets a sinus infection. Feels rested in mornings.  5. Gastroesophageal reflux disease without esophagitis Is on omeprazole daily. No symptoms when takes meds.  6. Recurrent major depressive disorder, in partial remission ([place]) Takes wellbutrin daily and has been down lately Depression screen [facility] 2/9 12/29/2018 06/29/2018 02/12/2018  Decreased Interest 3 0 1  Down, Depressed, Hopeless 1 0 0  PHQ - 2 Score 4 0 1  Altered sleeping 3 - -  Tired, decreased energy 3 - -  Change in appetite 1 - -  Feeling bad or failure about yourself  0 - -  Trouble concentrating 0 - -  Moving slowly or fidgety/restless 0 - -  Suicidal thoughts 0 - -  PHQ-9 Score 11 - -  Difficult doing work/chores - - -  Some recent data might be hidden   GAD 7 : Generalized Anxiety Score 12/29/2018  Nervous, Anxious, on Edge 0  Control/stop worrying 1  Worry too much - different things 1  Trouble relaxing 1  Restless 0  Easily annoyed or irritable 0  Afraid - awful might happen 0  Total GAD 7 Score 3  Anxiety Difficulty Not difficult at all     7. BMI  39.0-39.9,adult No recent weight changes Wt Readings from Last 3 Encounters:  12/29/18 (!) 317 lb (143.8 kg)  06/29/18 (!) 319 lb 12.8 oz (145.1 kg)  09/15/17 (!) 312 lb (141.5 kg)   BMI Readings from Last 3 Encounters:  12/29/18 40.70 kg/m  06/29/18 41.06 kg/m  09/15/17 40.06 kg/m      Outpatient Encounter Medications as of 12/29/2018  Medication Sig  . albuterol (PROVENTIL HFA;VENTOLIN HFA) 108 (90 Base) MCG/ACT inhaler INHALE 2 PUFFS BY MOUTH EVERY 6 HOURS AS NEEDED FOR WHEEZING OR  SHORTNESS  OF  BREATH  . aspirin 325 MG tablet Take 325 mg by mouth daily.  Marland Kitchen atorvastatin (LIPITOR) 40 MG tablet TAKE 1 TABLET BY MOUTH ONCE DAILY -NEED  APPT  FOR  MORE  REFILLS-  . buPROPion (WELLBUTRIN XL) 150 MG 24 hr tablet Take 1 tablet (150 mg total) by mouth daily.  Marland Kitchen escitalopram (LEXAPRO) 20 MG tablet Take 1 tablet (20 mg total) by mouth daily.  Marland Kitchen losartan (COZAAR) 100 MG tablet Take 1 tablet (100 mg total) by mouth daily. (Needs to be seen before next refill)  . meloxicam (MOBIC) 15 MG tablet   . omeprazole (PRILOSEC) 40 MG capsule TAKE 1 CAPSULE BY MOUTH ONCE DAILY NEED  APPT  FOR  REFILLS  . SYMBICORT 160-4.5 MCG/ACT  inhaler INHALE 2 PUFFS BY MOUTH TWICE DAILY INTO  THE  LUNGS  . testosterone (ANDROGEL) 50 MG/5GM (1%) GEL Apply 1 5g package to shoulder daily  . triamcinolone cream (KENALOG) 0.1 % APPLY ONE APPLICATION TWICE DAILY (Patient taking differently: APPLY ONE APPLICATION TWICE DAILY AS NEEDED FOR SKIN IRRITATION.)     Past Surgical History:  Procedure Laterality Date  . BACK SURGERY    . CHOLECYSTECTOMY    . COLONOSCOPY N/A 09/05/2016   Procedure: COLONOSCOPY;  Surgeon: Daneil Dolin, MD;  Location: [facility];  Service: Endoscopy;  Laterality: N/A;  2:15 PM  . ESOPHAGOGASTRODUODENOSCOPY    . LEFT ANKLE SURGERY      Family History  Problem Relation Age of Onset  . Diabetes Mother   . Heart disease Mother   . Diabetes Sister   . Cancer Brother   . Cancer Sister         pancreatic    New complaints: He needs total knee replacement, but ortho will not do until he loses 45lbs. He says his knees hurt to bad to do any exercise. He feels like the depression is coming from his knee pain.  Social history: Lives with his wife- works for a [employer]  Controlled substance contract: n/a    Review of Systems  Constitutional: Negative for activity change and appetite change.  HENT: Negative.   Eyes: Negative for pain.  Respiratory: Negative for shortness of breath.   Cardiovascular: Negative for chest pain, palpitations and leg swelling.  Gastrointestinal: Negative for abdominal pain.  Endocrine: Negative for polydipsia.  Genitourinary: Negative.   Skin: Negative for rash.  Neurological: Negative for dizziness, weakness and headaches.  Hematological: Does not bruise/bleed easily.  Psychiatric/Behavioral: Negative.   All other systems reviewed and are negative.      Objective:   Physical Exam Vitals signs and nursing note reviewed.  Constitutional:      Appearance: Normal appearance. He is well-developed.  HENT:     Head: Normocephalic.     Nose: Nose normal.  Eyes:     Pupils: Pupils are equal, round, and reactive to light.  Neck:     Musculoskeletal: Normal range of motion and neck supple.     Thyroid: No thyroid mass or thyromegaly.     Vascular: No carotid bruit or JVD.     Trachea: Phonation normal.  Cardiovascular:     Rate and Rhythm: Normal rate and regular rhythm.  Pulmonary:     Effort: Pulmonary effort is normal. No respiratory distress.     Breath sounds: Normal breath sounds.  Abdominal:     General: Bowel sounds are normal.     Palpations: Abdomen is soft.     Tenderness: There is no abdominal tenderness.  Musculoskeletal: Normal range of motion.  Lymphadenopathy:     Cervical: No cervical adenopathy.  Skin:    General: Skin is warm and dry.  Neurological:     Mental Status: He is alert and oriented to  person, place, and time.  Psychiatric:        Behavior: Behavior normal.        Thought Content: Thought content normal.        Judgment: Judgment normal.    BP 130/86   Pulse 81   Temp 98.6 F (37 C) (Temporal)   Resp 20   Ht _0  (1.88 m)   Wt (!) 317 lb (143.8 kg)   SpO2 95%   BMI 40.70 kg/m  Assessment & Plan:  ANNA LIVERS comes in today with chief complaint of Medical Management of Chronic Issues   Diagnosis and orders addressed:  1. Essential hypertension Low sodium diet - losartan (COZAAR) 100 MG tablet; Take 1 tablet (100 mg total) by mouth daily. (Needs to be seen before next refill)  Dispense: 90 tablet; Refill: 1 - CMP14+EGFR  2. Mixed hyperlipidemia Low fat diet - atorvastatin (LIPITOR) 40 MG tablet; TAKE 1 TABLET BY MOUTH ONCE DAILY -NEED  APPT  FOR  MORE  REFILLS-  Dispense: 90 tablet; Refill: 1 - Lipid panel  3. Mild intermittent chronic asthma without complication Continue symbicort daily - SYMBICORT 160-4.5 MCG/ACT inhaler; INHALE 2 PUFFS BY MOUTH TWICE DAILY INTO  THE  LUNGS  Dispense: 11 g; Refill: 5  4. OSA (obstructive sleep apnea) Wear CPAP  5. Gastroesophageal reflux disease without esophagitis Avoid spicy foods Do not eat 2 hours prior to bedtime - omeprazole (PRILOSEC) 40 MG capsule; TAKE 1 CAPSULE BY MOUTH ONCE DAILY NEED  APPT  FOR  REFILLS  Dispense: 90 capsule; Refill: 1  6. Recurrent major depressive disorder, in partial remission ([place]) Stress management Increased wellbutrin to 379m daily - escitalopram (LEXAPRO) 20 MG tablet; Take 1 tablet (20 mg total) by mouth daily.  Dispense: 90 tablet; Refill: 1  7. BMI 39.0-39.9,adult Discussed diet and exercise for person with BMI >25 Will recheck weight in 3-6 months   Labs pending Health Maintenance reviewed Diet and exercise encouraged  Follow up plan: 6 months  Mary-Margaret MHassell Done FNP

## 2018-12-30 LAB — LIPID PANEL
Chol/HDL Ratio: 2.9 ratio (ref 0.0–5.0)
Cholesterol, Total: 157 mg/dL (ref 100–199)
HDL: 55 mg/dL (ref >39)
LDL Chol Calc (NIH): 77 mg/dL (ref 0–99)
Triglycerides: 144 mg/dL (ref 0–149)
VLDL Cholesterol Cal: 25 mg/dL (ref 5–40)

## 2018-12-30 LAB — CMP14+EGFR
ALT: 24 IU/L (ref 0–44)
AST: 20 IU/L (ref 0–40)
Albumin/Globulin Ratio: 1.5 (ref 1.2–2.2)
Albumin: 4 g/dL (ref 3.8–4.9)
Alkaline Phosphatase: 133 IU/L — ABNORMAL HIGH (ref 39–117)
BUN/Creatinine Ratio: 14 (ref 9–20)
BUN: 15 mg/dL (ref 6–24)
Bilirubin Total: 0.5 mg/dL (ref 0.0–1.2)
CO2: 23 mmol/L (ref 20–29)
Calcium: 9.3 mg/dL (ref 8.7–10.2)
Chloride: 106 mmol/L (ref 96–106)
Creatinine, Ser: 1.09 mg/dL (ref 0.76–1.27)
GFR calc Af Amer: 85 mL/min/{1.73_m2} (ref >59)
GFR calc non Af Amer: 74 mL/min/{1.73_m2} (ref >59)
Globulin, Total: 2.6 g/dL (ref 1.5–4.5)
Glucose: 87 mg/dL (ref 65–99)
Potassium: 4.8 mmol/L (ref 3.5–5.2)
Sodium: 142 mmol/L (ref 134–144)
Total Protein: 6.6 g/dL (ref 6.0–8.5)

## 2019-04-11 ENCOUNTER — Other Ambulatory Visit: Payer: Self-pay | Admitting: Nurse Practitioner

## 2019-05-24 ENCOUNTER — Encounter: Payer: Self-pay | Admitting: Nurse Practitioner

## 2019-05-24 ENCOUNTER — Telehealth (INDEPENDENT_AMBULATORY_CARE_PROVIDER_SITE_OTHER): Payer: 59 | Admitting: Nurse Practitioner

## 2019-05-24 ENCOUNTER — Other Ambulatory Visit: Payer: Self-pay

## 2019-05-24 DIAGNOSIS — R42 Dizziness and giddiness: Secondary | ICD-10-CM

## 2019-05-24 DIAGNOSIS — R519 Headache, unspecified: Secondary | ICD-10-CM | POA: Diagnosis not present

## 2019-05-24 MED ORDER — MECLIZINE HCL 25 MG PO TABS: 25.0000 mg | ORAL_TABLET | Freq: Three times a day (TID) | ORAL | 0 refills | Status: DC | PRN

## 2019-05-24 MED ORDER — PREDNISONE 20 MG PO TABS: ORAL_TABLET | ORAL | 0 refills | Status: DC

## 2019-05-24 NOTE — Progress Notes (Signed)
   Virtual Visit via video Note   Due to COVID-19 pandemic this visit was conducted virtually. This visit type was conducted due to national recommendations for restrictions regarding the COVID-19 Pandemic (e.g. social distancing, sheltering in place) in an effort to limit this patient's exposure and mitigate transmission in our community. All issues noted in this document were discussed and addressed.  A physical exam was not performed with this format.  I connected with  David Ortega  on 05/24/19 at 8:35 by video and verified that I am speaking with the correct person using two identifiers. David Ortega is currently located at home and his wife is currently with him during visit. The provider, Mary-Margaret Hassell Done, FNP is located in their office at time of visit.  I discussed the limitations, risks, security and privacy concerns of performing an evaluation and management service by telephone and the availability of in person appointments. I also discussed with the patient that there may be a patient responsible charge related to this service. The patient expressed understanding and agreed to proceed.   History and Present Illness:   Chief Complaint: Headache and Dizziness   HPI Patient calls in today c/o headache and dizziness that started on Sunday. Rates headache 9/10. Mainly frontally.    Review of Systems  Constitutional: Negative for chills and fever.  HENT: Negative for congestion.   Eyes: Positive for blurred vision, double vision and photophobia.  Respiratory: Negative for cough.   Cardiovascular: Negative.   Neurological: Positive for dizziness and headaches.  Psychiatric/Behavioral: Negative.   All other systems reviewed and are negative.      Observations/Objective: Alert and oriented- answers all questions appropriately No distress    Assessment and Plan: David Ortega in today with chief complaint of Headache and Dizziness   1. Acute  nonintractable headache, unspecified headache type - predniSONE (DELTASONE) 20 MG tablet; 2 po at sametime daily for 5 days  Dispense: 10 tablet; Refill: 0  2. Dizziness Force fluids Rest   - meclizine (ANTIVERT) 25 MG tablet; Take 1 tablet (25 mg total) by mouth 3 (three) times daily as needed for dizziness.  Dispense: 30 tablet; Refill: 0     Follow Up Instructions: prn    I discussed the assessment and treatment plan with the patient. The patient was provided an opportunity to ask questions and all were answered. The patient agreed with the plan and demonstrated an understanding of the instructions.   The patient was advised to call back or seek an in-person evaluation if the symptoms worsen or if the condition fails to improve as anticipated.  The above assessment and management plan was discussed with the patient. The patient verbalized understanding of and has agreed to the management plan. Patient is aware to call the clinic if symptoms persist or worsen. Patient is aware when to return to the clinic for a follow-up visit. Patient educated on when it is appropriate to go to the emergency department.   Time call ended: 8:48  I provided 13 minutes of face-to-face time during this encounter.    Mary-Margaret Hassell Done, FNP

## 2019-05-26 ENCOUNTER — Encounter: Payer: Self-pay | Admitting: Nurse Practitioner

## 2019-06-02 ENCOUNTER — Encounter: Payer: Self-pay | Admitting: Nurse Practitioner

## 2019-06-30 ENCOUNTER — Ambulatory Visit: Payer: 59 | Admitting: Nurse Practitioner

## 2019-07-13 ENCOUNTER — Encounter: Payer: Self-pay | Admitting: Internal Medicine

## 2019-07-19 ENCOUNTER — Other Ambulatory Visit: Payer: Self-pay

## 2019-07-19 ENCOUNTER — Ambulatory Visit (INDEPENDENT_AMBULATORY_CARE_PROVIDER_SITE_OTHER): Payer: 59 | Admitting: Nurse Practitioner

## 2019-07-19 ENCOUNTER — Encounter: Payer: Self-pay | Admitting: Nurse Practitioner

## 2019-07-19 VITALS — BP 127/78 | HR 75 | Temp 98.3°F | Resp 20 | Ht 74.0 in | Wt 314.0 lb

## 2019-07-19 DIAGNOSIS — J4520 Mild intermittent asthma, uncomplicated: Secondary | ICD-10-CM

## 2019-07-19 DIAGNOSIS — I10 Essential (primary) hypertension: Secondary | ICD-10-CM

## 2019-07-19 DIAGNOSIS — E782 Mixed hyperlipidemia: Secondary | ICD-10-CM | POA: Diagnosis not present

## 2019-07-19 DIAGNOSIS — K219 Gastro-esophageal reflux disease without esophagitis: Secondary | ICD-10-CM | POA: Diagnosis not present

## 2019-07-19 DIAGNOSIS — Z6839 Body mass index (BMI) 39.0-39.9, adult: Secondary | ICD-10-CM

## 2019-07-19 DIAGNOSIS — F3341 Major depressive disorder, recurrent, in partial remission: Secondary | ICD-10-CM

## 2019-07-19 MED ORDER — SYMBICORT 160-4.5 MCG/ACT IN AERO: INHALATION_SPRAY | RESPIRATORY_TRACT | 5 refills | Status: DC

## 2019-07-19 MED ORDER — BUPROPION HCL ER (XL) 300 MG PO TB24: 300.0000 mg | ORAL_TABLET | Freq: Every day | ORAL | 1 refills | Status: DC

## 2019-07-19 MED ORDER — ATORVASTATIN CALCIUM 40 MG PO TABS: ORAL_TABLET | ORAL | 1 refills | Status: DC

## 2019-07-19 MED ORDER — LOSARTAN POTASSIUM 100 MG PO TABS: 100.0000 mg | ORAL_TABLET | Freq: Every day | ORAL | 1 refills | Status: DC

## 2019-07-19 MED ORDER — ESCITALOPRAM OXALATE 20 MG PO TABS: 20.0000 mg | ORAL_TABLET | Freq: Every day | ORAL | 1 refills | Status: DC

## 2019-07-19 MED ORDER — BUSPIRONE HCL 15 MG PO TABS: 15.0000 mg | ORAL_TABLET | Freq: Two times a day (BID) | ORAL | 1 refills | Status: DC

## 2019-07-19 MED ORDER — OMEPRAZOLE 40 MG PO CPDR: DELAYED_RELEASE_CAPSULE | ORAL | 1 refills | Status: DC

## 2019-07-19 NOTE — Addendum Note (Signed)
Addended by: Chevis Pretty on: 07/19/2019 04:27 PM   Modules accepted: Orders

## 2019-07-19 NOTE — Patient Instructions (Signed)
DASH Eating Plan DASH stands for "Dietary Approaches to Stop Hypertension." The DASH eating plan is a healthy eating plan that has been shown to reduce high blood pressure (hypertension). It may also reduce your risk for type 2 diabetes, heart disease, and stroke. The DASH eating plan may also help with weight loss. What are tips for following this plan?  General guidelines  Avoid eating more than 2,300 mg (milligrams) of salt (sodium) a day. If you have hypertension, you may need to reduce your sodium intake to 1,500 mg a day.  Limit alcohol intake to no more than 1 drink a day for nonpregnant women and 2 drinks a day for men. One drink equals 12 oz of beer, 5 oz of wine, or 1 oz of hard liquor.  Work with your health care provider to maintain a healthy body weight or to lose weight. Ask what an ideal weight is for you.  Get at least 30 minutes of exercise that causes your heart to beat faster (aerobic exercise) most days of the week. Activities may include walking, swimming, or biking.  Work with your health care provider or diet and nutrition specialist (dietitian) to adjust your eating plan to your individual calorie needs. Reading food labels   Check food labels for the amount of sodium per serving. Choose foods with less than 5 percent of the Daily Value of sodium. Generally, foods with less than 300 mg of sodium per serving fit into this eating plan.  To find whole grains, look for the word "whole" as the first word in the ingredient list. Shopping  Buy products labeled as "low-sodium" or "no salt added."  Buy fresh foods. Avoid canned foods and premade or frozen meals. Cooking  Avoid adding salt when cooking. Use salt-free seasonings or herbs instead of table salt or sea salt. Check with your health care provider or pharmacist before using salt substitutes.  Do not fry foods. Cook foods using healthy methods such as baking, boiling, grilling, and broiling instead.  Cook with  heart-healthy oils, such as olive, canola, soybean, or sunflower oil. Meal planning  Eat a balanced diet that includes: ? 5 or more servings of fruits and vegetables each day. At each meal, try to fill half of your plate with fruits and vegetables. ? Up to 6-8 servings of whole grains each day. ? Less than 6 oz of lean meat, poultry, or fish each day. A 3-oz serving of meat is about the same size as a deck of cards. One egg equals 1 oz. ? 2 servings of low-fat dairy each day. ? A serving of nuts, seeds, or beans 5 times each week. ? Heart-healthy fats. Healthy fats called Omega-3 fatty acids are found in foods such as flaxseeds and coldwater fish, like sardines, salmon, and mackerel.  Limit how much you eat of the following: ? Canned or prepackaged foods. ? Food that is high in trans fat, such as fried foods. ? Food that is high in saturated fat, such as fatty meat. ? Sweets, desserts, sugary drinks, and other foods with added sugar. ? Full-fat dairy products.  Do not salt foods before eating.  Try to eat at least 2 vegetarian meals each week.  Eat more home-cooked food and less restaurant, buffet, and fast food.  When eating at a restaurant, ask that your food be prepared with less salt or no salt, if possible. What foods are recommended? The items listed may not be a complete list. Talk with your dietitian about   what dietary choices are best for you. Grains Whole-grain or whole-wheat bread. Whole-grain or whole-wheat pasta. Brown rice. Oatmeal. Quinoa. Bulgur. Whole-grain and low-sodium cereals. Pita bread. Low-fat, low-sodium crackers. Whole-wheat flour tortillas. Vegetables Fresh or frozen vegetables (raw, steamed, roasted, or grilled). Low-sodium or reduced-sodium tomato and vegetable juice. Low-sodium or reduced-sodium tomato sauce and tomato paste. Low-sodium or reduced-sodium canned vegetables. Fruits All fresh, dried, or frozen fruit. Canned fruit in natural juice (without  added sugar). Meat and other protein foods Skinless chicken or turkey. Ground chicken or turkey. Pork with fat trimmed off. Fish and seafood. Egg whites. Dried beans, peas, or lentils. Unsalted nuts, nut butters, and seeds. Unsalted canned beans. Lean cuts of beef with fat trimmed off. Low-sodium, lean deli meat. Dairy Low-fat (1%) or fat-free (skim) milk. Fat-free, low-fat, or reduced-fat cheeses. Nonfat, low-sodium ricotta or cottage cheese. Low-fat or nonfat yogurt. Low-fat, low-sodium cheese. Fats and oils Soft margarine without trans fats. Vegetable oil. Low-fat, reduced-fat, or light mayonnaise and salad dressings (reduced-sodium). Canola, safflower, olive, soybean, and sunflower oils. Avocado. Seasoning and other foods Herbs. Spices. Seasoning mixes without salt. Unsalted popcorn and pretzels. Fat-free sweets. What foods are not recommended? The items listed may not be a complete list. Talk with your dietitian about what dietary choices are best for you. Grains Baked goods made with fat, such as croissants, muffins, or some breads. Dry pasta or rice meal packs. Vegetables Creamed or fried vegetables. Vegetables in a cheese sauce. Regular canned vegetables (not low-sodium or reduced-sodium). Regular canned tomato sauce and paste (not low-sodium or reduced-sodium). Regular tomato and vegetable juice (not low-sodium or reduced-sodium). Pickles. Olives. Fruits Canned fruit in a light or heavy syrup. Fried fruit. Fruit in cream or butter sauce. Meat and other protein foods Fatty cuts of meat. Ribs. Fried meat. Bacon. Sausage. Bologna and other processed lunch meats. Salami. Fatback. Hotdogs. Bratwurst. Salted nuts and seeds. Canned beans with added salt. Canned or smoked fish. Whole eggs or egg yolks. Chicken or turkey with skin. Dairy Whole or 2% milk, cream, and half-and-half. Whole or full-fat cream cheese. Whole-fat or sweetened yogurt. Full-fat cheese. Nondairy creamers. Whipped toppings.  Processed cheese and cheese spreads. Fats and oils Butter. Stick margarine. Lard. Shortening. Ghee. Bacon fat. Tropical oils, such as coconut, palm kernel, or palm oil. Seasoning and other foods Salted popcorn and pretzels. Onion salt, garlic salt, seasoned salt, table salt, and sea salt. Worcestershire sauce. Tartar sauce. Barbecue sauce. Teriyaki sauce. Soy sauce, including reduced-sodium. Steak sauce. Canned and packaged gravies. Fish sauce. Oyster sauce. Cocktail sauce. Horseradish that you find on the shelf. Ketchup. Mustard. Meat flavorings and tenderizers. Bouillon cubes. Hot sauce and Tabasco sauce. Premade or packaged marinades. Premade or packaged taco seasonings. Relishes. Regular salad dressings. Where to find more information:  National Heart, Lung, and Blood Institute: www.nhlbi.nih.gov  American Heart Association: www.heart.org Summary  The DASH eating plan is a healthy eating plan that has been shown to reduce high blood pressure (hypertension). It may also reduce your risk for type 2 diabetes, heart disease, and stroke.  With the DASH eating plan, you should limit salt (sodium) intake to 2,300 mg a day. If you have hypertension, you may need to reduce your sodium intake to 1,500 mg a day.  When on the DASH eating plan, aim to eat more fresh fruits and vegetables, whole grains, lean proteins, low-fat dairy, and heart-healthy fats.  Work with your health care provider or diet and nutrition specialist (dietitian) to adjust your eating plan to your   individual calorie needs. This information is not intended to replace advice given to you by your health care provider. Make sure you discuss any questions you have with your health care provider. Document Revised: 12/26/2016 Document Reviewed: 01/07/2016 Elsevier Patient Education  2020 Elsevier Inc.  

## 2019-07-19 NOTE — Progress Notes (Addendum)
Subjective:    Patient ID: David Ortega, male    DOB: 1960/09/15, 59 y.o.   MRN: [id number]   Chief Complaint: Medical Management of Chronic Issues    HPI:  1. Essential hypertension BP Readings from Last 3 Encounters:  12/29/18 130/86  06/29/18 133/78  02/12/18 139/85  Denies checking blood pressure regularly. Takes medication as prescribed. Denies excess salt use. Denies any exercise. Denies chest pain, SOB, or weakness.  2. Mild intermittent chronic asthma without complication States no new or worsening symptoms. Has not been wearing CPCP to sleep.   3. Gastroesophageal reflux disease without esophagitis Denies any issues with reflux. Tries to avoid friend and fatty foods, denies big meals before bed.   4. Mixed hyperlipidemia Lab Results  Component Value Date   CHOL 157 12/29/2018   HDL 55 12/29/2018   LDLCALC 77 12/29/2018   TRIG 144 12/29/2018   CHOLHDL 2.9 12/29/2018   Takes medication as prescribed, somewhat watches diet in regards to high cholesterol foods.   5. Recurrent major depressive disorder, in partial remission [facility]) Daughter getting a divorce, states a lot of 'every day things' increasing stress and anxiety.  GAD 7 : Generalized Anxiety Score 07/19/2019 07/19/2019 12/29/2018  Nervous, Anxious, on Edge 2 1 0  Control/stop worrying 2 1 1   Worry too much - different things 3 1 1   Trouble relaxing 1 1 1   Restless 1 0 0  Easily annoyed or irritable 0 0 0  Afraid - awful might happen 0 0 0  Total GAD 7 Score 9 4 3   Anxiety Difficulty Not difficult at all Not difficult at all Not difficult at all     6. BMI 39.0-39.9,adult BMI Readings from Last 3 Encounters:  07/19/19 40.32 kg/m  12/29/18 40.70 kg/m  06/29/18 41.06 kg/m   Wt Readings from Last 3 Encounters:  07/19/19 (!) 314 lb (142.4 kg)  12/29/18 (!) 317 lb (143.8 kg)  06/29/18 (!) 319 lb 12.8 oz (145.1 kg)   Pt has began on weight watches then stopped, plans to rejoin soon.      Outpatient Encounter Medications as of 07/19/2019  Medication Sig  . albuterol (VENTOLIN HFA) 108 (90 Base) MCG/ACT inhaler INHALE 2 PUFFS BY MOUTH EVERY 6 HOURS AS NEEDED FOR WHEEZING AND FOR SHORTNESS OF BREATH  . aspirin 325 MG tablet Take 325 mg by mouth daily.  Marland Kitchen atorvastatin (LIPITOR) 40 MG tablet TAKE 1 TABLET BY MOUTH ONCE DAILY -NEED  APPT  FOR  MORE  REFILLS-  . buPROPion (WELLBUTRIN XL) 300 MG 24 hr tablet Take 1 tablet (300 mg total) by mouth daily.  Marland Kitchen escitalopram (LEXAPRO) 20 MG tablet Take 1 tablet (20 mg total) by mouth daily.  Marland Kitchen losartan (COZAAR) 100 MG tablet Take 1 tablet (100 mg total) by mouth daily. (Needs to be seen before next refill)  . meclizine (ANTIVERT) 25 MG tablet Take 1 tablet (25 mg total) by mouth 3 (three) times daily as needed for dizziness.  . meloxicam (MOBIC) 15 MG tablet   . omeprazole (PRILOSEC) 40 MG capsule TAKE 1 CAPSULE BY MOUTH ONCE DAILY NEED  APPT  FOR  REFILLS  . predniSONE (DELTASONE) 20 MG tablet 2 po at sametime daily for 5 days  . SYMBICORT 160-4.5 MCG/ACT inhaler INHALE 2 PUFFS BY MOUTH TWICE DAILY INTO  THE  LUNGS  . triamcinolone cream (KENALOG) 0.1 % APPLY ONE APPLICATION TWICE DAILY (Patient taking differently: APPLY ONE APPLICATION TWICE DAILY AS NEEDED FOR SKIN IRRITATION.)  Past Surgical History:  Procedure Laterality Date  . BACK SURGERY    . CHOLECYSTECTOMY    . COLONOSCOPY N/A 09/05/2016   Procedure: COLONOSCOPY;  Surgeon: Daneil Dolin, MD;  Location: [facility];  Service: Endoscopy;  Laterality: N/A;  2:15 PM  . ESOPHAGOGASTRODUODENOSCOPY    . LEFT ANKLE SURGERY      Family History  Problem Relation Age of Onset  . Diabetes Mother   . Heart disease Mother   . Diabetes Sister   . Cancer Brother   . Cancer Sister        pancreatic    New complaints: Increased depression and anxiety.   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 Travels with wife for fun.   Controlled  substance contract: n/a    Review of Systems  Constitutional: Negative.   HENT: Negative.   Eyes: Negative.   Respiratory: Negative.   Cardiovascular: Negative.   Gastrointestinal: Negative.   Endocrine: Negative.   Genitourinary: Negative.   Musculoskeletal: Negative.   Skin: Negative.   Allergic/Immunologic: Negative.   Neurological: Negative.   Hematological: Negative.   Psychiatric/Behavioral: Negative.   All other systems reviewed and are negative.      Objective:   Physical Exam Vitals and nursing note reviewed.  Constitutional:      Appearance: Normal appearance. He is obese.  HENT:     Head: Normocephalic.     Right Ear: Tympanic membrane, ear canal and external ear normal.     Left Ear: Tympanic membrane, ear canal and external ear normal.     Nose: Nose normal.     Mouth/Throat:     Mouth: Mucous membranes are moist.     Pharynx: Oropharynx is clear.  Eyes:     Extraocular Movements: Extraocular movements intact.     Conjunctiva/sclera: Conjunctivae normal.     Pupils: Pupils are equal, round, and reactive to light.  Cardiovascular:     Rate and Rhythm: Normal rate and regular rhythm.     Pulses: Normal pulses.     Heart sounds: Normal heart sounds.  Pulmonary:     Effort: Pulmonary effort is normal.     Breath sounds: Normal breath sounds.  Abdominal:     General: Abdomen is flat. Bowel sounds are normal.     Palpations: Abdomen is soft.  Musculoskeletal:        General: Normal range of motion.     Cervical back: Normal range of motion and neck supple.     Right knee: Swelling present.     Left knee: Swelling present.     Comments: bilat knee pain, referred to bilat knee replacement but required to lose weight  Skin:    General: Skin is warm and dry.     Capillary Refill: Capillary refill takes less than 2 seconds.  Neurological:     General: No focal deficit present.     Mental Status: He is alert and oriented to person, place, and time. Mental  status is at baseline.  Psychiatric:        Mood and Affect: Mood normal.        Behavior: Behavior normal.        Thought Content: Thought content normal.        Judgment: Judgment normal.    BP 127/78   Pulse 75   Temp 98.3 F (36.8 C) (Temporal)   Resp 20   Ht 6' 2"  (1.88 m)   Wt Marland Kitchen)  314 lb (142.4 kg)   SpO2 95%   BMI 40.32 kg/m   Ear lavage- s/p tm clear bil-    Assessment & Plan:  David Ortega comes in today with chief complaint of Medical Management of Chronic Issues   Diagnosis and orders addressed:  1. Essential hypertension Continue taking medications as prescribed, follow a heart healthy diet with low salt. Try to exercise 3-5 times a week as your knees can tolerate.   2. Mild intermittent chronic asthma without complication Continue medication, avoid allergens or smokers that may trigger complications.   3. Gastroesophageal reflux disease without esophagitis Continue medication as prescribed. Avoid large meals at night. Avoid fried, fatty, and spicy foods.   4. Mixed hyperlipidemia Continue taking medications, work on eating a low cholesterol diet. Exercise helps lower cholesterol levels.   5. Recurrent major depressive disorder, in partial remission ([place]) Find activities that lower your stress levels. Stress management is important in depressive disorders. Avoid drugs and alcohol which can make symptoms worse.   6. BMI 39.0-39.9,adult Start your [employer] watchers program. Begin exercising regularly.   Meds ordered this encounter  Medications  . SYMBICORT 160-4.5 MCG/ACT inhaler    Sig: INHALE 2 PUFFS BY MOUTH TWICE DAILY INTO  THE  LUNGS    Dispense:  11 g    Refill:  5    Order Specific Question:   Supervising Provider    Answer:   Caryl Pina A [id number]  . losartan (COZAAR) 100 MG tablet    Sig: Take 1 tablet (100 mg total) by mouth daily. (Needs to be seen before next refill)    Dispense:  90 tablet    Refill:  1    Last rf - NTBS     Order Specific Question:   Supervising Provider    Answer:   Caryl Pina A [[id number]]  . omeprazole (PRILOSEC) 40 MG capsule    Sig: TAKE 1 CAPSULE BY MOUTH ONCE DAILY NEED  APPT  FOR  REFILLS    Dispense:  90 capsule    Refill:  1    Order Specific Question:   Supervising Provider    Answer:   Caryl Pina A [id number]  . atorvastatin (LIPITOR) 40 MG tablet    Sig: TAKE 1 TABLET BY MOUTH ONCE DAILY -NEED  APPT  FOR  MORE  REFILLS-    Dispense:  90 tablet    Refill:  1    Order Specific Question:   Supervising Provider    Answer:   Caryl Pina A [id number]  . escitalopram (LEXAPRO) 20 MG tablet    Sig: Take 1 tablet (20 mg total) by mouth daily.    Dispense:  90 tablet    Refill:  1    Order Specific Question:   Supervising Provider    Answer:   Caryl Pina A [id number]  . buPROPion (WELLBUTRIN XL) 300 MG 24 hr tablet    Sig: Take 1 tablet (300 mg total) by mouth daily.    Dispense:  90 tablet    Refill:  1    Order Specific Question:   Supervising Provider    Answer:   Caryl Pina A [[id number]]   Orders Placed This Encounter  Procedures  . Lipid Panel  . CBC with Differential/Platelet  . CMP14+EGFR     Labs pending Health Maintenance reviewed Diet and exercise encouraged  Follow up plan: 3 month follow up.   Mary-Margaret Hassell Done, FNP

## 2019-07-20 LAB — CMP14+EGFR
ALT: 17 IU/L (ref 0–44)
AST: 15 IU/L (ref 0–40)
Albumin/Globulin Ratio: 1.9 (ref 1.2–2.2)
Albumin: 4.4 g/dL (ref 3.8–4.9)
Alkaline Phosphatase: 121 IU/L (ref 48–121)
BUN/Creatinine Ratio: 14 (ref 10–24)
BUN: 16 mg/dL (ref 8–27)
Bilirubin Total: 0.4 mg/dL (ref 0.0–1.2)
CO2: 24 mmol/L (ref 20–29)
Calcium: 9.4 mg/dL (ref 8.6–10.2)
Chloride: 104 mmol/L (ref 96–106)
Creatinine, Ser: 1.12 mg/dL (ref 0.76–1.27)
GFR calc Af Amer: 82 mL/min/{1.73_m2} (ref >59)
GFR calc non Af Amer: 71 mL/min/{1.73_m2} (ref >59)
Globulin, Total: 2.3 g/dL (ref 1.5–4.5)
Glucose: 90 mg/dL (ref 65–99)
Potassium: 4.8 mmol/L (ref 3.5–5.2)
Sodium: 141 mmol/L (ref 134–144)
Total Protein: 6.7 g/dL (ref 6.0–8.5)

## 2019-07-20 LAB — CBC WITH DIFFERENTIAL/PLATELET
Basophils Absolute: 0 10*3/uL (ref 0.0–0.2)
Basos: 0 %
EOS (ABSOLUTE): 0.3 10*3/uL (ref 0.0–0.4)
Eos: 3 %
Hematocrit: 41.8 % (ref 37.5–51.0)
Hemoglobin: 14.3 g/dL (ref 13.0–17.7)
Immature Grans (Abs): 0.1 10*3/uL (ref 0.0–0.1)
Immature Granulocytes: 1 %
Lymphocytes Absolute: 3 10*3/uL (ref 0.7–3.1)
Lymphs: 28 %
MCH: 33.2 pg — ABNORMAL HIGH (ref 26.6–33.0)
MCHC: 34.2 g/dL (ref 31.5–35.7)
MCV: 97 fL (ref 79–97)
Monocytes Absolute: 0.6 10*3/uL (ref 0.1–0.9)
Monocytes: 6 %
Neutrophils Absolute: 6.6 10*3/uL (ref 1.4–7.0)
Neutrophils: 62 %
Platelets: 260 10*3/uL (ref 150–450)
RBC: 4.31 x10E6/uL (ref 4.14–5.80)
RDW: 13.2 % (ref 11.6–15.4)
WBC: 10.6 10*3/uL (ref 3.4–10.8)

## 2019-07-20 LAB — LIPID PANEL
Chol/HDL Ratio: 3.2 ratio (ref 0.0–5.0)
Cholesterol, Total: 180 mg/dL (ref 100–199)
HDL: 56 mg/dL (ref >39)
LDL Chol Calc (NIH): 101 mg/dL — ABNORMAL HIGH (ref 0–99)
Triglycerides: 129 mg/dL (ref 0–149)
VLDL Cholesterol Cal: 23 mg/dL (ref 5–40)

## 2019-09-22 ENCOUNTER — Other Ambulatory Visit: Payer: Self-pay | Admitting: Nurse Practitioner

## 2019-09-22 DIAGNOSIS — I10 Essential (primary) hypertension: Secondary | ICD-10-CM

## 2019-09-22 MED ORDER — LOSARTAN POTASSIUM 100 MG PO TABS: 100.0000 mg | ORAL_TABLET | Freq: Every day | ORAL | 1 refills | Status: DC

## 2019-10-13 ENCOUNTER — Other Ambulatory Visit: Payer: Self-pay | Admitting: Nurse Practitioner

## 2019-10-21 ENCOUNTER — Ambulatory Visit: Payer: Self-pay | Admitting: Nurse Practitioner

## 2019-10-25 ENCOUNTER — Other Ambulatory Visit: Payer: Self-pay | Admitting: Nurse Practitioner

## 2019-10-25 DIAGNOSIS — F3341 Major depressive disorder, recurrent, in partial remission: Secondary | ICD-10-CM

## 2019-10-25 MED ORDER — BUSPIRONE HCL 15 MG PO TABS: 15.0000 mg | ORAL_TABLET | Freq: Two times a day (BID) | ORAL | 2 refills | Status: DC

## 2019-10-26 ENCOUNTER — Encounter: Payer: Self-pay | Admitting: Nurse Practitioner

## 2019-11-04 ENCOUNTER — Other Ambulatory Visit: Payer: Self-pay | Admitting: Nurse Practitioner

## 2019-11-04 MED ORDER — SULFAMETHOXAZOLE-TRIMETHOPRIM 800-160 MG PO TABS
1.0000 | ORAL_TABLET | Freq: Two times a day (BID) | ORAL | 0 refills | Status: DC
Start: 2019-11-04 — End: 2019-11-29

## 2019-11-29 ENCOUNTER — Other Ambulatory Visit: Payer: Self-pay

## 2019-11-29 ENCOUNTER — Ambulatory Visit (INDEPENDENT_AMBULATORY_CARE_PROVIDER_SITE_OTHER): Payer: Commercial Managed Care - PPO | Admitting: Nurse Practitioner

## 2019-11-29 ENCOUNTER — Encounter: Payer: Self-pay | Admitting: Nurse Practitioner

## 2019-11-29 VITALS — BP 134/80 | HR 78 | Temp 97.9°F | Resp 20 | Ht 74.0 in | Wt 316.0 lb

## 2019-11-29 DIAGNOSIS — E782 Mixed hyperlipidemia: Secondary | ICD-10-CM

## 2019-11-29 DIAGNOSIS — Z6839 Body mass index (BMI) 39.0-39.9, adult: Secondary | ICD-10-CM

## 2019-11-29 DIAGNOSIS — F3341 Major depressive disorder, recurrent, in partial remission: Secondary | ICD-10-CM

## 2019-11-29 DIAGNOSIS — I10 Essential (primary) hypertension: Secondary | ICD-10-CM | POA: Diagnosis not present

## 2019-11-29 DIAGNOSIS — G4733 Obstructive sleep apnea (adult) (pediatric): Secondary | ICD-10-CM | POA: Diagnosis not present

## 2019-11-29 DIAGNOSIS — K219 Gastro-esophageal reflux disease without esophagitis: Secondary | ICD-10-CM

## 2019-11-29 DIAGNOSIS — J4520 Mild intermittent asthma, uncomplicated: Secondary | ICD-10-CM

## 2019-11-29 DIAGNOSIS — R42 Dizziness and giddiness: Secondary | ICD-10-CM

## 2019-11-29 LAB — BAYER DCA HB A1C WAIVED: HB A1C (BAYER DCA - WAIVED): 6 % (ref <7.0)

## 2019-11-29 MED ORDER — ATORVASTATIN CALCIUM 40 MG PO TABS: ORAL_TABLET | ORAL | 1 refills | Status: DC

## 2019-11-29 MED ORDER — SYMBICORT 160-4.5 MCG/ACT IN AERO: INHALATION_SPRAY | RESPIRATORY_TRACT | 5 refills | Status: DC

## 2019-11-29 MED ORDER — OMEPRAZOLE 40 MG PO CPDR: DELAYED_RELEASE_CAPSULE | ORAL | 1 refills | Status: DC

## 2019-11-29 MED ORDER — BUPROPION HCL ER (XL) 300 MG PO TB24: 300.0000 mg | ORAL_TABLET | Freq: Every day | ORAL | 1 refills | Status: DC

## 2019-11-29 MED ORDER — LOSARTAN POTASSIUM 100 MG PO TABS: 100.0000 mg | ORAL_TABLET | Freq: Every day | ORAL | 1 refills | Status: DC

## 2019-11-29 MED ORDER — BUSPIRONE HCL 15 MG PO TABS: 15.0000 mg | ORAL_TABLET | Freq: Two times a day (BID) | ORAL | 2 refills | Status: DC

## 2019-11-29 MED ORDER — MECLIZINE HCL 25 MG PO TABS: 25.0000 mg | ORAL_TABLET | Freq: Three times a day (TID) | ORAL | 3 refills | Status: DC | PRN

## 2019-11-29 NOTE — Patient Instructions (Signed)

## 2019-11-29 NOTE — Progress Notes (Signed)
Subjective:    Patient ID: David Ortega, male    DOB: 1960-04-19, 59 y.o.   MRN: [id number]   Chief Complaint: Medical Management of Chronic Issues    HPI:  1. Primary hypertension Does not check BP at home. Does watch salt intake. Denies chest pain. Does have SOB but has COPD and is unchanged. Is having some headaches every couple of weeks but does not think they are related to high BP. BP Readings from Last 3 Encounters:  07/19/19 127/78  12/29/18 130/86  06/29/18 133/78    2. Mild intermittent chronic asthma without complication Does have to use the albuterol rescue inhaler at times, more if the weather is humid and less in cooler weather. Uses symbicort twice daily.   3. OSA (obstructive sleep apnea) Has a CPAP machine but hasn't used it in a while. Says he got sick the last time he used his and hasn't used it since. Does report some daytime sleepiness but mostly related to getting up early for work.  4. Gastroesophageal reflux disease without esophagitis Doing well on prilosec. Occasionally takes tums.  5. Mixed hyperlipidemia Does watch intake of fried and fatty foods. Lab Results  Component Value Date   CHOL 180 07/19/2019   HDL 56 07/19/2019   LDLCALC 101 (H) 07/19/2019   TRIG 129 07/19/2019   CHOLHDL 3.2 07/19/2019     6. Recurrent major depressive disorder, in partial remission ([place]) Says he is doing fair with depression symptoms. Says he worries a lot. PHQ9 SCORE ONLY 11/29/2019 07/19/2019 05/24/2019  PHQ-9 Total Score 4 0 4    7. BMI 39.0-39.9,adult No significant changes in weight. Wt Readings from Last 3 Encounters:  11/29/19 (!) 316 lb (143.3 kg)  07/19/19 (!) 314 lb (142.4 kg)  12/29/18 (!) 317 lb (143.8 kg)   BMI Readings from Last 3 Encounters:  11/29/19 40.57 kg/m  07/19/19 40.32 kg/m  12/29/18 40.70 kg/m     Outpatient Encounter Medications as of 11/29/2019  Medication Sig  . albuterol (VENTOLIN HFA) 108 (90 Base) MCG/ACT inhaler  INHALE 2 PUFFS BY MOUTH EVERY 6 HOURS AS NEEDED FOR WHEEZING AND FOR SHORTNESS OF BREATH  . aspirin 325 MG tablet Take 325 mg by mouth daily.  Marland Kitchen atorvastatin (LIPITOR) 40 MG tablet TAKE 1 TABLET BY MOUTH ONCE DAILY -NEED  APPT  FOR  MORE  REFILLS-  . buPROPion (WELLBUTRIN XL) 300 MG 24 hr tablet Take 1 tablet (300 mg total) by mouth daily.  . busPIRone (BUSPAR) 15 MG tablet Take 1 tablet (15 mg total) by mouth 2 (two) times daily.  Marland Kitchen escitalopram (LEXAPRO) 20 MG tablet Take 1 tablet (20 mg total) by mouth daily.  Marland Kitchen losartan (COZAAR) 100 MG tablet Take 1 tablet (100 mg total) by mouth daily. (Needs to be seen before next refill)  . meclizine (ANTIVERT) 25 MG tablet Take 1 tablet (25 mg total) by mouth 3 (three) times daily as needed for dizziness.  . meloxicam (MOBIC) 15 MG tablet   . omeprazole (PRILOSEC) 40 MG capsule TAKE 1 CAPSULE BY MOUTH ONCE DAILY NEED  APPT  FOR  REFILLS  . SYMBICORT 160-4.5 MCG/ACT inhaler INHALE 2 PUFFS BY MOUTH TWICE DAILY INTO  THE  LUNGS  . triamcinolone cream (KENALOG) 0.1 % APPLY ONE APPLICATION TWICE DAILY (Patient taking differently: APPLY ONE APPLICATION TWICE DAILY AS NEEDED FOR SKIN IRRITATION.)  . [DISCONTINUED] sulfamethoxazole-trimethoprim (BACTRIM DS) 800-160 MG tablet Take 1 tablet by mouth 2 (two) times daily.   No  facility-administered encounter medications on file as of 11/29/2019.    Past Surgical History:  Procedure Laterality Date  . BACK SURGERY    . CHOLECYSTECTOMY    . COLONOSCOPY N/A 09/05/2016   Procedure: COLONOSCOPY;  Surgeon: Daneil Dolin, MD;  [facility]: [facility];  Service: Endoscopy;  Laterality: N/A;  2:15 PM  . ESOPHAGOGASTRODUODENOSCOPY    . LEFT ANKLE SURGERY      Family History  Problem Relation Age of Onset  . Diabetes Mother   . Heart disease Mother   . Diabetes Sister   . Cancer Brother   . Cancer Sister        pancreatic    New complaints: Has some places on his skin he would like checked. Would also like to  have A1C checked because he feels like his sugar is dropping at times. He will get dizzy, feel faint, and get sweaty. He eats some candy or something with sugar and he will feel better in about 20 minutes.   Social history: Works full time as a Armed forces operational officer: n/a   Review of Systems  Constitutional: Negative.   HENT: Negative.   Eyes: Negative.   Respiratory: Positive for shortness of breath.   Cardiovascular: Negative.   Gastrointestinal: Negative.   Endocrine: Negative.   Genitourinary: Negative.   Musculoskeletal: Negative.   Skin: Negative.   Neurological: Positive for headaches.  Psychiatric/Behavioral: Negative.        Objective:   Physical Exam Vitals and nursing note reviewed.  Constitutional:      Appearance: Normal appearance.  HENT:     Head: Normocephalic.     Right Ear: Tympanic membrane normal.     Left Ear: Tympanic membrane normal.     Nose: Nose normal.     Mouth/Throat:     Mouth: Mucous membranes are moist.     Pharynx: Oropharynx is clear.  Cardiovascular:     Rate and Rhythm: Normal rate and regular rhythm.     Pulses: Normal pulses.     Heart sounds: Normal heart sounds.  Pulmonary:     Effort: Pulmonary effort is normal.     Breath sounds: Normal breath sounds.  Abdominal:     General: Bowel sounds are normal.     Palpations: Abdomen is soft.     Hernia: A hernia (umbilical hernia) is present.  Musculoskeletal:        General: Normal range of motion.     Cervical back: Normal range of motion.  Skin:    General: Skin is warm and dry.  Neurological:     General: No focal deficit present.     Mental Status: He is alert and oriented to person, place, and time.  Psychiatric:        Mood and Affect: Mood normal.        Behavior: Behavior normal.   BP 134/80   Pulse 78   Temp 97.9 F (36.6 C) (Temporal)   Resp 20   Ht 6' 2"  (1.88 m)   Wt (!) 316 lb (143.3 kg)   SpO2 96%   BMI 40.57 kg/m         Assessment & Plan:  JAYLEN KNOPE comes in today with chief complaint of Medical Management of Chronic Issues   Diagnosis and orders addressed:  1. Primary hypertension Low sodium diet - losartan (COZAAR) 100 MG tablet; Take 1 tablet (100 mg total) by mouth daily. (Needs to be seen before next  refill)  Dispense: 90 tablet; Refill: 1 - Bayer DCA Hb A1c Waived - CBC with Differential/Platelet - CMP14+EGFR  2. Mild intermittent chronic asthma without complication Avoid cigarete smoking  3. OSA (obstructive sleep apnea) Wear CPAP at night- will help with sleep and possibloy headaches  4. Gastroesophageal reflux disease without esophagitis Avoid spicy foods Do not eat 2 hours prior to bedtime - omeprazole (PRILOSEC) 40 MG capsule; TAKE 1 CAPSULE BY MOUTH ONCE DAILY NEED  APPT  FOR  REFILLS  Dispense: 90 capsule; Refill: 1  5. Mixed hyperlipidemia Low fat diet - atorvastatin (LIPITOR) 40 MG tablet; TAKE 1 TABLET BY MOUTH ONCE DAILY -NEED  APPT  FOR  MORE  REFILLS-  Dispense: 90 tablet; Refill: 1 - Lipid panel  6. Recurrent major depressive disorder, in partial remission (HCC) Stress management - busPIRone (BUSPAR) 15 MG tablet; Take 1 tablet (15 mg total) by mouth 2 (two) times daily.  Dispense: 60 tablet; Refill: 2 - buPROPion (WELLBUTRIN XL) 300 MG 24 hr tablet; Take 1 tablet (300 mg total) by mouth daily.  Dispense: 90 tablet; Refill: 1  7. BMI 39.0-39.9,adult Discussed diet and exercise for person with BMI >25 Will recheck weight in 3-6 months  8. Asthma, chronic, mild intermittent, uncomplicated - SYMBICORT 867-5.4 MCG/ACT inhaler; INHALE 2 PUFFS BY MOUTH TWICE DAILY INTO  THE  LUNGS  Dispense: 11 g; Refill: 5  9. Dizziness - meclizine (ANTIVERT) 25 MG tablet; Take 1 tablet (25 mg total) by mouth 3 (three) times daily as needed for dizziness.  Dispense: 30 tablet; Refill: 3   Labs pending Health Maintenance reviewed Diet and exercise encouraged  Follow up  plan: 16month   Mary-Margaret MHassell Done FNP

## 2019-11-30 LAB — LIPID PANEL
Chol/HDL Ratio: 4 ratio (ref 0.0–5.0)
Cholesterol, Total: 214 mg/dL — ABNORMAL HIGH (ref 100–199)
HDL: 53 mg/dL (ref >39)
LDL Chol Calc (NIH): 123 mg/dL — ABNORMAL HIGH (ref 0–99)
Triglycerides: 216 mg/dL — ABNORMAL HIGH (ref 0–149)
VLDL Cholesterol Cal: 38 mg/dL (ref 5–40)

## 2019-11-30 LAB — CMP14+EGFR
ALT: 22 IU/L (ref 0–44)
AST: 20 IU/L (ref 0–40)
Albumin/Globulin Ratio: 1.7 (ref 1.2–2.2)
Albumin: 4.3 g/dL (ref 3.8–4.9)
Alkaline Phosphatase: 123 IU/L — ABNORMAL HIGH (ref 44–121)
BUN/Creatinine Ratio: 14 (ref 10–24)
BUN: 18 mg/dL (ref 8–27)
Bilirubin Total: 0.5 mg/dL (ref 0.0–1.2)
CO2: 24 mmol/L (ref 20–29)
Calcium: 9.6 mg/dL (ref 8.6–10.2)
Chloride: 102 mmol/L (ref 96–106)
Creatinine, Ser: 1.26 mg/dL (ref 0.76–1.27)
GFR calc Af Amer: 71 mL/min/{1.73_m2} (ref >59)
GFR calc non Af Amer: 62 mL/min/{1.73_m2} (ref >59)
Globulin, Total: 2.6 g/dL (ref 1.5–4.5)
Glucose: 96 mg/dL (ref 65–99)
Potassium: 5.3 mmol/L — ABNORMAL HIGH (ref 3.5–5.2)
Sodium: 138 mmol/L (ref 134–144)
Total Protein: 6.9 g/dL (ref 6.0–8.5)

## 2019-11-30 LAB — CBC WITH DIFFERENTIAL/PLATELET
Basophils Absolute: 0 10*3/uL (ref 0.0–0.2)
Basos: 0 %
EOS (ABSOLUTE): 0.2 10*3/uL (ref 0.0–0.4)
Eos: 2 %
Hematocrit: 39.2 % (ref 37.5–51.0)
Hemoglobin: 13.6 g/dL (ref 13.0–17.7)
Immature Grans (Abs): 0.1 10*3/uL (ref 0.0–0.1)
Immature Granulocytes: 1 %
Lymphocytes Absolute: 2.9 10*3/uL (ref 0.7–3.1)
Lymphs: 27 %
MCH: 33.3 pg — ABNORMAL HIGH (ref 26.6–33.0)
MCHC: 34.7 g/dL (ref 31.5–35.7)
MCV: 96 fL (ref 79–97)
Monocytes Absolute: 0.8 10*3/uL (ref 0.1–0.9)
Monocytes: 7 %
Neutrophils Absolute: 6.6 10*3/uL (ref 1.4–7.0)
Neutrophils: 63 %
Platelets: 242 10*3/uL (ref 150–450)
RBC: 4.09 x10E6/uL — ABNORMAL LOW (ref 4.14–5.80)
RDW: 13.5 % (ref 11.6–15.4)
WBC: 10.6 10*3/uL (ref 3.4–10.8)

## 2020-02-20 ENCOUNTER — Other Ambulatory Visit: Payer: Self-pay | Admitting: Nurse Practitioner

## 2020-02-20 MED ORDER — ALBUTEROL SULFATE HFA 108 (90 BASE) MCG/ACT IN AERS: 2.0000 | INHALATION_SPRAY | Freq: Four times a day (QID) | RESPIRATORY_TRACT | 2 refills | Status: DC | PRN

## 2020-05-19 ENCOUNTER — Other Ambulatory Visit: Payer: Self-pay | Admitting: Nurse Practitioner

## 2020-05-19 DIAGNOSIS — J4520 Mild intermittent asthma, uncomplicated: Secondary | ICD-10-CM

## 2020-05-29 ENCOUNTER — Ambulatory Visit: Payer: Self-pay | Admitting: Nurse Practitioner

## 2020-06-07 ENCOUNTER — Other Ambulatory Visit: Payer: Self-pay

## 2020-06-07 ENCOUNTER — Ambulatory Visit: Payer: Commercial Managed Care - PPO | Admitting: Nurse Practitioner

## 2020-06-07 ENCOUNTER — Encounter: Payer: Self-pay | Admitting: Nurse Practitioner

## 2020-06-07 VITALS — BP 146/85 | HR 80 | Temp 97.4°F | Resp 20 | Ht 74.0 in | Wt 313.0 lb

## 2020-06-07 DIAGNOSIS — I10 Essential (primary) hypertension: Secondary | ICD-10-CM

## 2020-06-07 DIAGNOSIS — R739 Hyperglycemia, unspecified: Secondary | ICD-10-CM | POA: Diagnosis not present

## 2020-06-07 DIAGNOSIS — E782 Mixed hyperlipidemia: Secondary | ICD-10-CM | POA: Diagnosis not present

## 2020-06-07 DIAGNOSIS — K219 Gastro-esophageal reflux disease without esophagitis: Secondary | ICD-10-CM

## 2020-06-07 DIAGNOSIS — F3341 Major depressive disorder, recurrent, in partial remission: Secondary | ICD-10-CM

## 2020-06-07 DIAGNOSIS — J4520 Mild intermittent asthma, uncomplicated: Secondary | ICD-10-CM

## 2020-06-07 DIAGNOSIS — G43109 Migraine with aura, not intractable, without status migrainosus: Secondary | ICD-10-CM

## 2020-06-07 DIAGNOSIS — Z6839 Body mass index (BMI) 39.0-39.9, adult: Secondary | ICD-10-CM

## 2020-06-07 DIAGNOSIS — G4733 Obstructive sleep apnea (adult) (pediatric): Secondary | ICD-10-CM

## 2020-06-07 LAB — BAYER DCA HB A1C WAIVED: HB A1C (BAYER DCA - WAIVED): 5.8 % (ref <7.0)

## 2020-06-07 MED ORDER — ATORVASTATIN CALCIUM 40 MG PO TABS: ORAL_TABLET | ORAL | 1 refills | Status: DC

## 2020-06-07 MED ORDER — SYMBICORT 160-4.5 MCG/ACT IN AERO: INHALATION_SPRAY | RESPIRATORY_TRACT | 0 refills | Status: DC

## 2020-06-07 MED ORDER — BUPROPION HCL ER (XL) 300 MG PO TB24: 300.0000 mg | ORAL_TABLET | Freq: Every day | ORAL | 1 refills | Status: DC

## 2020-06-07 MED ORDER — OMEPRAZOLE 40 MG PO CPDR: DELAYED_RELEASE_CAPSULE | ORAL | 1 refills | Status: DC

## 2020-06-07 MED ORDER — ESCITALOPRAM OXALATE 20 MG PO TABS: 20.0000 mg | ORAL_TABLET | Freq: Every day | ORAL | 1 refills | Status: DC

## 2020-06-07 MED ORDER — PROPRANOLOL HCL ER 60 MG PO CP24
60.0000 mg | ORAL_CAPSULE | Freq: Every day | ORAL | 2 refills | Status: DC
Start: 2020-06-07 — End: 2020-07-17

## 2020-06-07 MED ORDER — BUSPIRONE HCL 15 MG PO TABS: 15.0000 mg | ORAL_TABLET | Freq: Two times a day (BID) | ORAL | 2 refills | Status: DC

## 2020-06-07 MED ORDER — LOSARTAN POTASSIUM 100 MG PO TABS: 100.0000 mg | ORAL_TABLET | Freq: Every day | ORAL | 1 refills | Status: DC

## 2020-06-07 NOTE — Progress Notes (Signed)
Subjective:    Patient ID: David Ortega, male    DOB: Apr 02, 1960, 60 y.o.   MRN: [id number]   Chief Complaint: medical management of chronic issues     HPI:  1. Primary hypertension No c/o chest pain, sob or headache. Does not check blood pressure at home.\ BP Readings from Last 3 Encounters:  11/29/19 134/80  07/19/19 127/78  12/29/18 130/86     2. Mixed hyperlipidemia Does not watch diet and does very little exercise. He is on lipitor Lab Results  Component Value Date   CHOL 214 (H) 11/29/2019   HDL 53 11/29/2019   LDLCALC 123 (H) 11/29/2019   TRIG 216 (H) 11/29/2019   CHOLHDL 4.0 11/29/2019   The 10-year ASCVD risk score Mikey Bussing DC Jr., et al., 2013) is: 21%   3. Elevated blood sugar Has had elevated blood sugars in the past. He doe snot check blood sugars at home. Lab Results  Component Value Date   HGBA1C 6.0 11/29/2019     4. Gastroesophageal reflux disease without esophagitis Is on omeprazole daily and is doing well.  5. Mild intermittent chronic asthma without complication Is in symbicort daily and works well  6. OSA (obstructive sleep apnea) does not sleep good when he wears his CPAP. Says he feels rested in mornings and sleeps about 6 hours.  7. Recurrent major depressive disorder, in partial remission ([place]) I son lexarpo and wellbutrin daily along with buspar on as needed basis. Patient is doing well right now. Depression screen [facility] 2/9 06/07/2020 11/29/2019 07/19/2019  Decreased Interest 1 1 0  Down, Depressed, Hopeless 1 1 0  PHQ - 2 Score 2 2 0  Altered sleeping 0 1 0  Tired, decreased energy 1 1 0  Change in appetite 0 0 0  Feeling bad or failure about yourself  0 0 0  Trouble concentrating 0 0 0  Moving slowly or fidgety/restless 0 0 0  Suicidal thoughts 0 0 0  PHQ-9 Score 3 4 0  Difficult doing work/chores Not difficult at all Not difficult at all Not difficult at all  Some recent data might be hidden     8. BMI  39.0-39.9,adult Weight is down 3 lbs since last visit. Wt Readings from Last 3 Encounters:  06/07/20 (!) 313 lb (142 kg)  11/29/19 (!) 316 lb (143.3 kg)  07/19/19 (!) 314 lb (142.4 kg)   BMI Readings from Last 3 Encounters:  06/07/20 40.19 kg/m  11/29/19 40.57 kg/m  07/19/19 40.32 kg/m        Outpatient Encounter Medications as of 06/07/2020  Medication Sig  . albuterol (VENTOLIN HFA) 108 (90 Base) MCG/ACT inhaler Inhale 2 puffs into the lungs every 6 (six) hours as needed for wheezing or shortness of breath.  Marland Kitchen aspirin 325 MG tablet Take 325 mg by mouth daily.  Marland Kitchen atorvastatin (LIPITOR) 40 MG tablet TAKE 1 TABLET BY MOUTH ONCE DAILY -NEED  APPT  FOR  MORE  REFILLS-  . buPROPion (WELLBUTRIN XL) 300 MG 24 hr tablet Take 1 tablet (300 mg total) by mouth daily.  . busPIRone (BUSPAR) 15 MG tablet Take 1 tablet (15 mg total) by mouth 2 (two) times daily.  Marland Kitchen escitalopram (LEXAPRO) 20 MG tablet Take 1 tablet (20 mg total) by mouth daily.  Marland Kitchen losartan (COZAAR) 100 MG tablet Take 1 tablet (100 mg total) by mouth daily. (Needs to be seen before next refill)  . meclizine (ANTIVERT) 25 MG tablet Take 1 tablet (25 mg total) by  mouth 3 (three) times daily as needed for dizziness.  . meloxicam (MOBIC) 15 MG tablet   . omeprazole (PRILOSEC) 40 MG capsule TAKE 1 CAPSULE BY MOUTH ONCE DAILY NEED  APPT  FOR  REFILLS  . SYMBICORT 160-4.5 MCG/ACT inhaler Inhale 2 puffs by mouth twice daily  . triamcinolone cream (KENALOG) 0.1 % APPLY ONE APPLICATION TWICE DAILY (Patient taking differently: APPLY ONE APPLICATION TWICE DAILY AS NEEDED FOR SKIN IRRITATION.)   No facility-administered encounter medications on file as of 06/07/2020.    Past Surgical History:  Procedure Laterality Date  . BACK SURGERY    . CHOLECYSTECTOMY    . COLONOSCOPY N/A 09/05/2016   Procedure: COLONOSCOPY;  Surgeon: Daneil Dolin, MD;  [facility];  Service: Endoscopy;  Laterality: N/A;  2:15 PM  .  ESOPHAGOGASTRODUODENOSCOPY    . LEFT ANKLE SURGERY      Family History  Problem Relation Age of Onset  . Diabetes Mother   . Heart disease Mother   . Diabetes Sister   . Cancer Brother   . Cancer Sister        pancreatic    New complaints: Has been having migraines 1-2 x a month. Usually has to miss work when he has one.  Social history: Lives with his wife  Controlled substance contract: n/a    Review of Systems  Constitutional: Negative for diaphoresis.  Eyes: Negative for pain.  Respiratory: Negative for shortness of breath.   Cardiovascular: Negative for chest pain, palpitations and leg swelling.  Gastrointestinal: Negative for abdominal pain.  Endocrine: Negative for polydipsia.  Skin: Negative for rash.  Neurological: Negative for dizziness, weakness and headaches.  Hematological: Does not bruise/bleed easily.  All other systems reviewed and are negative.      Objective:   Physical Exam Vitals and nursing note reviewed.  Constitutional:      Appearance: Normal appearance. He is well-developed.  HENT:     Head: Normocephalic.     Nose: Nose normal.  Eyes:     Pupils: Pupils are equal, round, and reactive to light.  Neck:     Thyroid: No thyroid mass or thyromegaly.     Vascular: No carotid bruit or JVD.     Trachea: Phonation normal.  Cardiovascular:     Rate and Rhythm: Normal rate and regular rhythm.  Pulmonary:     Effort: Pulmonary effort is normal. No respiratory distress.     Breath sounds: Normal breath sounds.  Abdominal:     General: Bowel sounds are normal.     Palpations: Abdomen is soft.     Tenderness: There is no abdominal tenderness.     Hernia: A hernia (nontender , soft and reducible umbilical hernia.) is present.  Musculoskeletal:        General: Normal range of motion.     Cervical back: Normal range of motion and neck supple.  Lymphadenopathy:     Cervical: No cervical adenopathy.  Skin:    General: Skin is warm and dry.   Neurological:     Mental Status: He is alert and oriented to person, place, and time.  Psychiatric:        Behavior: Behavior normal.        Thought Content: Thought content normal.        Judgment: Judgment normal.    BP (!) 146/85   Pulse 80   Temp (!) 97.4 F (36.3 C) (Temporal)   Resp 20   Ht _0  (1.88 m)  Wt (!) 313 lb (142 kg)   SpO2 93%   BMI 40.19 kg/m         Assessment & Plan:   VELMER WOELFEL comes in today with chief complaint of Medical Management of Chronic Issues   Diagnosis and orders addressed:  1. Primary hypertension Low sodium diet - CBC with Differential/Platelet - CMP14+EGFR - losartan (COZAAR) 100 MG tablet; Take 1 tablet (100 mg total) by mouth daily. (Needs to be seen before next refill)  Dispense: 90 tablet; Refill: 1  2. Mixed hyperlipidemia Low fat diet - Lipid panel - atorvastatin (LIPITOR) 40 MG tablet; TAKE 1 TABLET BY MOUTH ONCE DAILY -NEED  APPT  FOR  MORE  REFILLS-  Dispense: 90 tablet; Refill: 1  3. Elevated blood sugar Watch carbs in diet - Bayer DCA Hb A1c Waived - Microalbumin / creatinine urine ratio  4. Gastroesophageal reflux disease without esophagitis Avoid spicy foods Do not eat 2 hours prior to bedtime - omeprazole (PRILOSEC) 40 MG capsule; TAKE 1 CAPSULE BY MOUTH ONCE DAILY NEED  APPT  FOR  REFILLS  Dispense: 90 capsule; Refill: 1  5. Mild intermittent chronic asthma without complication  6. OSA (obstructive sleep apnea) Cannot tolerate CPAP  7. Recurrent major depressive disorder, in partial remission (HCC) Stress management - buPROPion (WELLBUTRIN XL) 300 MG 24 hr tablet; Take 1 tablet (300 mg total) by mouth daily.  Dispense: 90 tablet; Refill: 1 - busPIRone (BUSPAR) 15 MG tablet; Take 1 tablet (15 mg total) by mouth 2 (two) times daily.  Dispense: 60 tablet; Refill: 2 - escitalopram (LEXAPRO) 20 MG tablet; Take 1 tablet (20 mg total) by mouth daily.  Dispense: 90 tablet; Refill: 1  8. BMI  39.0-39.9,adult Discussed diet and exercise for person with BMI >25 Will recheck weight in 3-6 months  9. Migraine with aura and without status migrainosus, not intractable Added propranolol for prevention - propranolol ER (INDERAL LA) 60 MG 24 hr capsule; Take 1 capsule (60 mg total) by mouth daily.  Dispense: 30 capsule; Refill: 2  10. Asthma, chronic, mild intermittent, uncomplicated - SYMBICORT 035-2.4 MCG/ACT inhaler; Inhale 2 puffs by mouth twice daily  Dispense: 11 g; Refill: 0   Labs pending Health Maintenance reviewed Diet and exercise encouraged  Follow up plan: 6 months   Mary-Margaret Hassell Done, FNP

## 2020-06-07 NOTE — Patient Instructions (Signed)

## 2020-06-08 LAB — CBC WITH DIFFERENTIAL/PLATELET
Basophils Absolute: 0 10*3/uL (ref 0.0–0.2)
Basos: 0 %
EOS (ABSOLUTE): 0.2 10*3/uL (ref 0.0–0.4)
Eos: 2 %
Hematocrit: 39.7 % (ref 37.5–51.0)
Hemoglobin: 14 g/dL (ref 13.0–17.7)
Immature Grans (Abs): 0.1 10*3/uL (ref 0.0–0.1)
Immature Granulocytes: 1 %
Lymphocytes Absolute: 2.3 10*3/uL (ref 0.7–3.1)
Lymphs: 28 %
MCH: 33.7 pg — ABNORMAL HIGH (ref 26.6–33.0)
MCHC: 35.3 g/dL (ref 31.5–35.7)
MCV: 96 fL (ref 79–97)
Monocytes Absolute: 0.5 10*3/uL (ref 0.1–0.9)
Monocytes: 6 %
Neutrophils Absolute: 5.2 10*3/uL (ref 1.4–7.0)
Neutrophils: 63 %
Platelets: 226 10*3/uL (ref 150–450)
RBC: 4.15 x10E6/uL (ref 4.14–5.80)
RDW: 13.6 % (ref 11.6–15.4)
WBC: 8.3 10*3/uL (ref 3.4–10.8)

## 2020-06-08 LAB — LIPID PANEL
Chol/HDL Ratio: 3.3 ratio (ref 0.0–5.0)
Cholesterol, Total: 173 mg/dL (ref 100–199)
HDL: 52 mg/dL (ref >39)
LDL Chol Calc (NIH): 90 mg/dL (ref 0–99)
Triglycerides: 184 mg/dL — ABNORMAL HIGH (ref 0–149)
VLDL Cholesterol Cal: 31 mg/dL (ref 5–40)

## 2020-06-08 LAB — CMP14+EGFR
ALT: 33 IU/L (ref 0–44)
AST: 29 IU/L (ref 0–40)
Albumin/Globulin Ratio: 1.6 (ref 1.2–2.2)
Albumin: 4.2 g/dL (ref 3.8–4.8)
Alkaline Phosphatase: 144 IU/L — ABNORMAL HIGH (ref 44–121)
BUN/Creatinine Ratio: 13 (ref 10–24)
BUN: 14 mg/dL (ref 8–27)
Bilirubin Total: 0.7 mg/dL (ref 0.0–1.2)
CO2: 23 mmol/L (ref 20–29)
Calcium: 9.3 mg/dL (ref 8.6–10.2)
Chloride: 106 mmol/L (ref 96–106)
Creatinine, Ser: 1.06 mg/dL (ref 0.76–1.27)
Globulin, Total: 2.7 g/dL (ref 1.5–4.5)
Glucose: 105 mg/dL — ABNORMAL HIGH (ref 65–99)
Potassium: 5.2 mmol/L (ref 3.5–5.2)
Sodium: 142 mmol/L (ref 134–144)
Total Protein: 6.9 g/dL (ref 6.0–8.5)
eGFR: 80 mL/min/{1.73_m2} (ref >59)

## 2020-06-08 LAB — MICROALBUMIN / CREATININE URINE RATIO
Creatinine, Urine: 139.3 mg/dL
Microalb/Creat Ratio: 61 mg/g creat — ABNORMAL HIGH (ref 0–29)
Microalbumin, Urine: 85.1 ug/mL

## 2020-07-11 ENCOUNTER — Telehealth: Payer: Self-pay

## 2020-07-11 NOTE — Telephone Encounter (Signed)
Patient contacted office stating that his propranalol was not helping. Advised patient to increase to twice daily per MMM. Patient verbalized understanding and rx sent to pharmacy

## 2020-07-17 ENCOUNTER — Other Ambulatory Visit: Payer: Self-pay | Admitting: Nurse Practitioner

## 2020-07-17 DIAGNOSIS — G43109 Migraine with aura, not intractable, without status migrainosus: Secondary | ICD-10-CM

## 2020-07-17 MED ORDER — PROPRANOLOL HCL ER 60 MG PO CP24: 60.0000 mg | ORAL_CAPSULE | Freq: Two times a day (BID) | ORAL | 2 refills | Status: DC

## 2020-08-06 ENCOUNTER — Other Ambulatory Visit: Payer: Self-pay | Admitting: Nurse Practitioner

## 2020-08-06 DIAGNOSIS — J4520 Mild intermittent asthma, uncomplicated: Secondary | ICD-10-CM

## 2020-08-21 ENCOUNTER — Other Ambulatory Visit: Payer: Self-pay | Admitting: Nurse Practitioner

## 2020-09-27 ENCOUNTER — Other Ambulatory Visit: Payer: Self-pay | Admitting: Nurse Practitioner

## 2020-09-27 DIAGNOSIS — F3341 Major depressive disorder, recurrent, in partial remission: Secondary | ICD-10-CM

## 2020-10-25 ENCOUNTER — Other Ambulatory Visit: Payer: Self-pay | Admitting: Nurse Practitioner

## 2020-12-07 ENCOUNTER — Ambulatory Visit: Payer: Self-pay | Admitting: Nurse Practitioner

## 2020-12-15 ENCOUNTER — Other Ambulatory Visit: Payer: Self-pay | Admitting: Nurse Practitioner

## 2020-12-15 DIAGNOSIS — J4520 Mild intermittent asthma, uncomplicated: Secondary | ICD-10-CM

## 2020-12-18 ENCOUNTER — Ambulatory Visit: Payer: Commercial Managed Care - PPO | Admitting: Nurse Practitioner

## 2020-12-18 ENCOUNTER — Encounter: Payer: Self-pay | Admitting: Nurse Practitioner

## 2020-12-18 ENCOUNTER — Other Ambulatory Visit: Payer: Self-pay

## 2020-12-18 VITALS — BP 131/82 | HR 79 | Temp 98.3°F | Resp 20 | Ht 74.0 in | Wt 324.0 lb

## 2020-12-18 DIAGNOSIS — J4520 Mild intermittent asthma, uncomplicated: Secondary | ICD-10-CM

## 2020-12-18 DIAGNOSIS — E782 Mixed hyperlipidemia: Secondary | ICD-10-CM

## 2020-12-18 DIAGNOSIS — I10 Essential (primary) hypertension: Secondary | ICD-10-CM | POA: Diagnosis not present

## 2020-12-18 DIAGNOSIS — G4733 Obstructive sleep apnea (adult) (pediatric): Secondary | ICD-10-CM

## 2020-12-18 DIAGNOSIS — F3341 Major depressive disorder, recurrent, in partial remission: Secondary | ICD-10-CM

## 2020-12-18 DIAGNOSIS — G43109 Migraine with aura, not intractable, without status migrainosus: Secondary | ICD-10-CM

## 2020-12-18 DIAGNOSIS — Z6839 Body mass index (BMI) 39.0-39.9, adult: Secondary | ICD-10-CM

## 2020-12-18 DIAGNOSIS — K219 Gastro-esophageal reflux disease without esophagitis: Secondary | ICD-10-CM

## 2020-12-18 MED ORDER — BUSPIRONE HCL 15 MG PO TABS: 15.0000 mg | ORAL_TABLET | Freq: Two times a day (BID) | ORAL | 2 refills | Status: DC

## 2020-12-18 MED ORDER — PROPRANOLOL HCL ER 60 MG PO CP24: 60.0000 mg | ORAL_CAPSULE | Freq: Two times a day (BID) | ORAL | 1 refills | Status: DC

## 2020-12-18 MED ORDER — SYMBICORT 160-4.5 MCG/ACT IN AERO: 2.0000 | INHALATION_SPRAY | Freq: Two times a day (BID) | RESPIRATORY_TRACT | 1 refills | Status: DC

## 2020-12-18 MED ORDER — BUPROPION HCL ER (XL) 300 MG PO TB24: 300.0000 mg | ORAL_TABLET | Freq: Every day | ORAL | 1 refills | Status: DC

## 2020-12-18 MED ORDER — LOSARTAN POTASSIUM 100 MG PO TABS: 100.0000 mg | ORAL_TABLET | Freq: Every day | ORAL | 1 refills | Status: DC

## 2020-12-18 MED ORDER — OMEPRAZOLE 40 MG PO CPDR
DELAYED_RELEASE_CAPSULE | ORAL | 1 refills | Status: DC
Start: 2020-12-18 — End: 2021-06-20

## 2020-12-18 MED ORDER — ATORVASTATIN CALCIUM 40 MG PO TABS: ORAL_TABLET | ORAL | 1 refills | Status: DC

## 2020-12-18 MED ORDER — ESCITALOPRAM OXALATE 20 MG PO TABS: 20.0000 mg | ORAL_TABLET | Freq: Every day | ORAL | 1 refills | Status: DC

## 2020-12-18 NOTE — Progress Notes (Signed)
Subjective:    Patient ID: David Ortega, male    DOB: Oct 11, 1960, 60 y.o.   MRN: [id number]   Chief Complaint: medical management of chronic issues     HPI:  1. Primary hypertension No c/o chest pain, sob or headache. Does not check blood pressure at home. BP Readings from Last 3 Encounters:  12/18/20 131/82  06/07/20 (!) 146/85  11/29/19 134/80     2. Mild intermittent chronic asthma without complication Doing good. He uses his symbicort daily as directed. Has had to use albuterol occasionally. Smokes 1 pack a day.  3. OSA (obstructive sleep apnea) Does not wear cpap because he says it has a funny smell.  4. Mixed hyperlipidemia Does not watch diet and does no dedicated exercise Lab Results  Component Value Date   CHOL 173 06/07/2020   HDL 52 06/07/2020   LDLCALC 90 06/07/2020   TRIG 184 (H) 06/07/2020   CHOLHDL 3.3 06/07/2020     5. Gastroesophageal reflux disease without esophagitis Is on omeprazole daily and is doing well.  6. Recurrent major depressive disorder, in partial remission ([place]) Is on combination wellbutrin and lexapro daily. Combination is working well. Depression screen [facility] 2/9 12/18/2020 06/07/2020 11/29/2019  Decreased Interest 3 1 1   Down, Depressed, Hopeless 1 1 1   PHQ - 2 Score 4 2 2   Altered sleeping 2 0 1  Tired, decreased energy 3 1 1   Change in appetite 0 0 0  Feeling bad or failure about yourself  0 0 0  Trouble concentrating 0 0 0  Moving slowly or fidgety/restless 0 0 0  Suicidal thoughts 0 0 0  PHQ-9 Score 9 3 4   Difficult doing work/chores Not difficult at all Not difficult at all Not difficult at all  Some recent data might be hidden     7. BMI 39.0-39.9,adult Weight is up 11lbs Wt Readings from Last 3 Encounters:  12/18/20 (!) 324 lb (147 kg)  06/07/20 (!) 313 lb (142 kg)  11/29/19 (!) 316 lb (143.3 kg)   BMI Readings from Last 3 Encounters:  12/18/20 41.60 kg/m  06/07/20 40.19 kg/m  11/29/19 40.57 kg/m        Outpatient Encounter Medications as of 12/18/2020  Medication Sig   albuterol (VENTOLIN HFA) 108 (90 Base) MCG/ACT inhaler INHALE 2 PUFFS BY MOUTH EVERY 6 HOURS AS NEEDED FOR WHEEZING AND SHORTNESS OF BREATH   aspirin 325 MG tablet Take 325 mg by mouth daily.   atorvastatin (LIPITOR) 40 MG tablet TAKE 1 TABLET BY MOUTH ONCE DAILY -NEED  APPT  FOR  MORE  REFILLS-   buPROPion (WELLBUTRIN XL) 300 MG 24 hr tablet Take 1 tablet (300 mg total) by mouth daily.   busPIRone (BUSPAR) 15 MG tablet Take 1 tablet (15 mg total) by mouth 2 (two) times daily.   escitalopram (LEXAPRO) 20 MG tablet Take 1 tablet by mouth once daily   losartan (COZAAR) 100 MG tablet Take 1 tablet (100 mg total) by mouth daily. (Needs to be seen before next refill)   meclizine (ANTIVERT) 25 MG tablet Take 1 tablet (25 mg total) by mouth 3 (three) times daily as needed for dizziness.   meloxicam (MOBIC) 15 MG tablet    omeprazole (PRILOSEC) 40 MG capsule TAKE 1 CAPSULE BY MOUTH ONCE DAILY NEED  APPT  FOR  REFILLS   propranolol ER (INDERAL LA) 60 MG 24 hr capsule Take 1 capsule (60 mg total) by mouth 2 (two) times daily.   SYMBICORT 160-4.5  MCG/ACT inhaler Inhale 2 puffs by mouth twice daily   triamcinolone cream (KENALOG) 0.1 % APPLY ONE APPLICATION TWICE DAILY   No facility-administered encounter medications on file as of 12/18/2020.    Past Surgical History:  Procedure Laterality Date   BACK SURGERY     CHOLECYSTECTOMY     COLONOSCOPY N/A 09/05/2016   Procedure: COLONOSCOPY;  Surgeon: Daneil Dolin, MD;  [facility];  Service: Endoscopy;  Laterality: N/A;  2:15 PM   ESOPHAGOGASTRODUODENOSCOPY     LEFT ANKLE SURGERY      Family History  Problem Relation Age of Onset   Diabetes Mother    Heart disease Mother    Diabetes Sister    Cancer Brother    Cancer Sister        pancreatic    New complaints: None today  Social history: Lives with wife  Controlled substance contract:  n/a     Review of Systems  Constitutional:  Negative for diaphoresis.  Eyes:  Negative for pain.  Respiratory:  Negative for shortness of breath.   Cardiovascular:  Negative for chest pain, palpitations and leg swelling.  Gastrointestinal:  Negative for abdominal pain.  Endocrine: Negative for polydipsia.  Skin:  Negative for rash.  Neurological:  Negative for dizziness, weakness and headaches.  Hematological:  Does not bruise/bleed easily.  All other systems reviewed and are negative.     Objective:   Physical Exam Vitals and nursing note reviewed.  Constitutional:      Appearance: Normal appearance. He is well-developed.  HENT:     Head: Normocephalic.     Nose: Nose normal.  Eyes:     Pupils: Pupils are equal, round, and reactive to light.  Neck:     Thyroid: No thyroid mass or thyromegaly.     Vascular: No carotid bruit or JVD.     Trachea: Phonation normal.  Cardiovascular:     Rate and Rhythm: Normal rate and regular rhythm.  Pulmonary:     Effort: Pulmonary effort is normal. No respiratory distress.     Breath sounds: Normal breath sounds.  Abdominal:     General: Bowel sounds are normal.     Palpations: Abdomen is soft.     Tenderness: There is no abdominal tenderness.  Musculoskeletal:        General: Normal range of motion.     Cervical back: Normal range of motion and neck supple.  Lymphadenopathy:     Cervical: No cervical adenopathy.  Skin:    General: Skin is warm and dry.  Neurological:     Mental Status: He is alert and oriented to person, place, and time.  Psychiatric:        Behavior: Behavior normal.        Thought Content: Thought content normal.        Judgment: Judgment normal.    BP 131/82   Pulse 79   Temp 98.3 F (36.8 C) (Temporal)   Resp 20   Ht 6' 2"  (1.88 m)   Wt (!) 324 lb (147 kg)   SpO2 95%   BMI 41.60 kg/m        Assessment & Plan:   ELIAS Ortega comes in today with chief complaint of Medical Management of  Chronic Issues   Diagnosis and orders addressed:  1. Primary hypertension Low sodium diet - losartan (COZAAR) 100 MG tablet; Take 1 tablet (100 mg total) by mouth daily. (Needs to be seen before next refill)  Dispense:  90 tablet; Refill: 1 - CBC with Differential/Platelet - CMP14+EGFR  2. Mild intermittent chronic asthma without complication Continue symbicort  3. OSA (obstructive sleep apnea) Check on getting CPAP clean  4. Mixed hyperlipidemia Low fat diet - atorvastatin (LIPITOR) 40 MG tablet; TAKE 1 TABLET BY MOUTH ONCE DAILY -NEED  APPT  FOR  MORE  REFILLS-  Dispense: 90 tablet; Refill: 1 - Lipid panel  5. Gastroesophageal reflux disease without esophagitis Avoid spicy foods Do not eat 2 hours prior to bedtime - omeprazole (PRILOSEC) 40 MG capsule; TAKE 1 CAPSULE BY MOUTH ONCE DAILY NEED  APPT  FOR  REFILLS  Dispense: 90 capsule; Refill: 1  6. Recurrent major depressive disorder, in partial remission (HCC) Stress management - escitalopram (LEXAPRO) 20 MG tablet; Take 1 tablet (20 mg total) by mouth daily.  Dispense: 90 tablet; Refill: 1 - buPROPion (WELLBUTRIN XL) 300 MG 24 hr tablet; Take 1 tablet (300 mg total) by mouth daily.  Dispense: 90 tablet; Refill: 1 - busPIRone (BUSPAR) 15 MG tablet; Take 1 tablet (15 mg total) by mouth 2 (two) times daily.  Dispense: 60 tablet; Refill: 2  7. BMI 39.0-39.9,adult Discussed diet and exercise for person with BMI >25 Will recheck weight in 3-6 months   8. Asthma, chronic, mild intermittent, uncomplicated - SYMBICORT 935-7.0 MCG/ACT inhaler; Inhale 2 puffs into the lungs 2 (two) times daily.  Dispense: 30.6 g; Refill: 1  9. Migraine with aura and without status migrainosus, not intractable  propranolol ER (INDERAL LA) 60 MG 24 hr capsule; Take 1 capsule (60 mg total) by mouth 2 (two) times daily.  Dispense: 180 capsule; Refill: 1  We will discuss meds for weight loss the first of the year Labs pending Health Maintenance  reviewed Diet and exercise encouraged  Follow up plan: 6 months   [place], FNP

## 2020-12-18 NOTE — Patient Instructions (Signed)

## 2020-12-25 ENCOUNTER — Other Ambulatory Visit: Payer: Self-pay

## 2020-12-25 DIAGNOSIS — I10 Essential (primary) hypertension: Secondary | ICD-10-CM

## 2020-12-25 DIAGNOSIS — E782 Mixed hyperlipidemia: Secondary | ICD-10-CM

## 2020-12-26 LAB — LIPID PANEL

## 2020-12-26 LAB — CMP14+EGFR

## 2020-12-26 LAB — CBC WITH DIFFERENTIAL/PLATELET
Basophils Absolute: 0 10*3/uL (ref 0.0–0.2)
Basos: 0 %
EOS (ABSOLUTE): 0.2 10*3/uL (ref 0.0–0.4)
Eos: 3 %
Hematocrit: 40.8 % (ref 37.5–51.0)
Hemoglobin: 14.4 g/dL (ref 13.0–17.7)
Immature Grans (Abs): 0 10*3/uL (ref 0.0–0.1)
Immature Granulocytes: 0 %
Lymphocytes Absolute: 2.8 10*3/uL (ref 0.7–3.1)
Lymphs: 31 %
MCH: 34 pg — ABNORMAL HIGH (ref 26.6–33.0)
MCHC: 35.3 g/dL (ref 31.5–35.7)
MCV: 97 fL (ref 79–97)
Monocytes Absolute: 0.6 10*3/uL (ref 0.1–0.9)
Monocytes: 7 %
Neutrophils Absolute: 5.3 10*3/uL (ref 1.4–7.0)
Neutrophils: 59 %
Platelets: 215 10*3/uL (ref 150–450)
RBC: 4.23 x10E6/uL (ref 4.14–5.80)
RDW: 13.5 % (ref 11.6–15.4)
WBC: 9 10*3/uL (ref 3.4–10.8)

## 2021-01-03 ENCOUNTER — Other Ambulatory Visit: Payer: Self-pay | Admitting: Nurse Practitioner

## 2021-01-04 ENCOUNTER — Other Ambulatory Visit: Payer: Self-pay

## 2021-01-04 ENCOUNTER — Ambulatory Visit (INDEPENDENT_AMBULATORY_CARE_PROVIDER_SITE_OTHER): Payer: Commercial Managed Care - PPO | Admitting: Nurse Practitioner

## 2021-01-04 ENCOUNTER — Encounter: Payer: Self-pay | Admitting: Nurse Practitioner

## 2021-01-04 VITALS — BP 135/92 | HR 96 | Temp 97.8°F | Resp 20 | Ht 74.0 in | Wt 324.0 lb

## 2021-01-04 DIAGNOSIS — J40 Bronchitis, not specified as acute or chronic: Secondary | ICD-10-CM

## 2021-01-04 DIAGNOSIS — E782 Mixed hyperlipidemia: Secondary | ICD-10-CM

## 2021-01-04 DIAGNOSIS — I10 Essential (primary) hypertension: Secondary | ICD-10-CM

## 2021-01-04 LAB — CMP14+EGFR
ALT: 28 IU/L (ref 0–44)
AST: 29 IU/L (ref 0–40)
Albumin/Globulin Ratio: 1.8 (ref 1.2–2.2)
Albumin: 4.3 g/dL (ref 3.8–4.8)
Alkaline Phosphatase: 134 IU/L — ABNORMAL HIGH (ref 44–121)
BUN/Creatinine Ratio: 21 (ref 10–24)
BUN: 22 mg/dL (ref 8–27)
Bilirubin Total: 0.4 mg/dL (ref 0.0–1.2)
CO2: 25 mmol/L (ref 20–29)
Calcium: 9.8 mg/dL (ref 8.6–10.2)
Chloride: 100 mmol/L (ref 96–106)
Creatinine, Ser: 1.05 mg/dL (ref 0.76–1.27)
Globulin, Total: 2.4 g/dL (ref 1.5–4.5)
Glucose: 141 mg/dL — ABNORMAL HIGH (ref 70–99)
Potassium: 4.9 mmol/L (ref 3.5–5.2)
Sodium: 139 mmol/L (ref 134–144)
Total Protein: 6.7 g/dL (ref 6.0–8.5)
eGFR: 81 mL/min/{1.73_m2} (ref >59)

## 2021-01-04 MED ORDER — BENZONATATE 100 MG PO CAPS: 100.0000 mg | ORAL_CAPSULE | Freq: Three times a day (TID) | ORAL | 0 refills | Status: DC | PRN

## 2021-01-04 MED ORDER — METHYLPREDNISOLONE ACETATE 80 MG/ML IJ SUSP
80.0000 mg | Freq: Once | INTRAMUSCULAR | Status: AC
Start: 2021-01-04 — End: 2021-01-04
  Administered 2021-01-04: 80 mg via INTRAMUSCULAR

## 2021-01-04 MED ORDER — PREDNISONE 20 MG PO TABS: 40.0000 mg | ORAL_TABLET | Freq: Every day | ORAL | 0 refills | Status: AC

## 2021-01-04 NOTE — Progress Notes (Signed)
Subjective:    Patient ID: David Ortega, male    DOB: Aug 31, 1960, 60 y.o.   MRN: [id number]  Chief Complaint: URI   HPI Patient went to the emergency room several weeks ago with SOB. They have referred him to cardiology. They started him on lasix in meantime. Now he is c/o cough and congestion. Cough is productive and phlegm is now yellowish green. He has ot been taking any OTC meds for cough.    Review of Systems  Constitutional:  Positive for chills and fatigue. Negative for fever.  HENT:  Positive for rhinorrhea. Negative for sinus pressure and sinus pain.   Respiratory:  Positive for cough, chest tightness (only when coughing) and shortness of breath (but that has improved on lasix).   Cardiovascular:  Negative for palpitations and leg swelling.  Genitourinary: Negative.   Neurological: Negative.   Psychiatric/Behavioral: Negative.        Objective:   Physical Exam Vitals and nursing note reviewed.  Constitutional:      Appearance: Normal appearance. He is obese.  HENT:     Right Ear: Tympanic membrane normal.     Left Ear: Tympanic membrane normal.     Nose: Congestion and rhinorrhea present.     Mouth/Throat:     Mouth: Mucous membranes are moist.  Cardiovascular:     Rate and Rhythm: Normal rate and regular rhythm.  Pulmonary:     Breath sounds: Wheezing (exp in bil lobes) present.  Skin:    General: Skin is warm.  Neurological:     General: No focal deficit present.     Mental Status: He is alert.    BP (!) 135/92   Pulse 96   Temp 97.8 F (36.6 C) (Temporal)   Resp 20   Ht 6\' 2"  (1.88 m)   Wt (!) 324 lb (147 kg)   SpO2 95%   BMI 41.60 kg/m        Assessment & Plan:   [name] in today with chief complaint of URI   1. Bronchitis 1. Take meds as prescribed 2. Use a cool mist humidifier especially during the winter months and when heat has been humid. 3. Use saline nose sprays frequently 4. Saline irrigations of the nose can be  very helpful if Ortega frequently.  * 4X daily for 1 week*  * Use of a nettie pot can be helpful with this. Follow directions with this* 5. Drink plenty of fluids 6. Keep thermostat turn down low 7.For any cough or congestion- tessalon 8. For fever or aces or pains- take tylenol or ibuprofen appropriate for age and weight.  * for fevers greater than 101 orally you may alternate ibuprofen and tylenol every  3 hours.    - methylPREDNISolone acetate (DEPO-MEDROL) injection 80 mg - benzonatate (TESSALON PERLES) 100 MG capsule; Take 1 capsule (100 mg total) by mouth 3 (three) times daily as needed for cough.  Dispense: 20 capsule; Refill: 0 - predniSONE (DELTASONE) 20 MG tablet; Take 2 tablets (40 mg total) by mouth daily with breakfast for 5 days. 2 po daily for 5 days  Dispense: 10 tablet; Refill: 0    The above assessment and management plan was discussed with the patient. The patient verbalized understanding of and has agreed to the management plan. Patient is aware to call the clinic if symptoms persist or worsen. Patient is aware when to return to the clinic for a follow-up visit. Patient educated on when it is appropriate to  go to the emergency department.   David Hassell Done, FNP

## 2021-01-04 NOTE — Patient Instructions (Signed)

## 2021-01-05 LAB — LIPID PANEL
Chol/HDL Ratio: 3.9 ratio (ref 0.0–5.0)
Cholesterol, Total: 203 mg/dL — ABNORMAL HIGH (ref 100–199)
HDL: 52 mg/dL (ref >39)
LDL Chol Calc (NIH): 104 mg/dL — ABNORMAL HIGH (ref 0–99)
Triglycerides: 278 mg/dL — ABNORMAL HIGH (ref 0–149)
VLDL Cholesterol Cal: 47 mg/dL — ABNORMAL HIGH (ref 5–40)

## 2021-02-09 ENCOUNTER — Other Ambulatory Visit: Payer: Self-pay | Admitting: Nurse Practitioner

## 2021-02-09 DIAGNOSIS — G43109 Migraine with aura, not intractable, without status migrainosus: Secondary | ICD-10-CM

## 2021-02-19 ENCOUNTER — Other Ambulatory Visit: Payer: Self-pay | Admitting: Nurse Practitioner

## 2021-02-19 DIAGNOSIS — G43109 Migraine with aura, not intractable, without status migrainosus: Secondary | ICD-10-CM

## 2021-02-19 MED ORDER — PROPRANOLOL HCL ER 60 MG PO CP24: 60.0000 mg | ORAL_CAPSULE | Freq: Every day | ORAL | 1 refills | Status: DC

## 2021-02-19 NOTE — Progress Notes (Signed)
Propranolol sent to pharmacy

## 2021-02-22 ENCOUNTER — Other Ambulatory Visit: Payer: Self-pay | Admitting: Nurse Practitioner

## 2021-03-27 ENCOUNTER — Encounter: Payer: Self-pay | Admitting: Family Medicine

## 2021-03-27 ENCOUNTER — Ambulatory Visit: Payer: Commercial Managed Care - PPO | Admitting: Family Medicine

## 2021-03-27 DIAGNOSIS — S39012A Strain of muscle, fascia and tendon of lower back, initial encounter: Secondary | ICD-10-CM

## 2021-03-27 MED ORDER — PREDNISONE 20 MG PO TABS: 40.0000 mg | ORAL_TABLET | Freq: Every day | ORAL | 0 refills | Status: AC

## 2021-03-27 NOTE — Progress Notes (Signed)
? ?Virtual Visit via Telephone Note ? ?I connected with David Ortega on 03/27/21 at 12:36 PM by telephone and verified that I am speaking with the correct person using two identifiers. David Ortega is currently located at home and his wife is currently with him during this visit. The provider, Loman Brooklyn, FNP is located in their office at time of visit. ? ?I discussed the limitations, risks, security and privacy concerns of performing an evaluation and management service by telephone and the availability of in person appointments. I also discussed with the patient that there may be a patient responsible charge related to this service. The patient expressed understanding and agreed to proceed. ? ?Subjective: ?PCP: Chevis Pretty, FNP ? ?Chief Complaint  ?Patient presents with  ? Back Pain  ? ?Patient reports he pulled something in his back 5 days ago when he was rolling landing gear at work.  The pain is located on the right side of his back at his hip.  Reports it is progressively getting worse and sometimes takes his breath away.  Pain is worse with certain movements.  Tylenol and Advil have been somewhat helpful.  Denies any pain shooting down his leg. ? ? ?ROS: Per HPI ? ?Current Outpatient Medications:  ?  albuterol (VENTOLIN HFA) 108 (90 Base) MCG/ACT inhaler, INHALE 2 PUFFS BY MOUTH EVERY 6 HOURS AS NEEDED FOR WHEEZING AND FOR SHORTNESS OF BREATH, Disp: 18 g, Rfl: 2 ?  aspirin 325 MG tablet, Take 325 mg by mouth daily., Disp: , Rfl:  ?  atorvastatin (LIPITOR) 40 MG tablet, TAKE 1 TABLET BY MOUTH ONCE DAILY -NEED  APPT  FOR  MORE  REFILLS-, Disp: 90 tablet, Rfl: 1 ?  benzonatate (TESSALON PERLES) 100 MG capsule, Take 1 capsule (100 mg total) by mouth 3 (three) times daily as needed for cough., Disp: 20 capsule, Rfl: 0 ?  buPROPion (WELLBUTRIN XL) 300 MG 24 hr tablet, Take 1 tablet (300 mg total) by mouth daily., Disp: 90 tablet, Rfl: 1 ?  busPIRone (BUSPAR) 15 MG tablet, Take 1 tablet (15  mg total) by mouth 2 (two) times daily., Disp: 60 tablet, Rfl: 2 ?  escitalopram (LEXAPRO) 20 MG tablet, Take 1 tablet (20 mg total) by mouth daily., Disp: 90 tablet, Rfl: 1 ?  furosemide (LASIX) 20 MG tablet, Take 20 mg by mouth daily., Disp: , Rfl:  ?  losartan (COZAAR) 100 MG tablet, Take 1 tablet (100 mg total) by mouth daily. (Needs to be seen before next refill), Disp: 90 tablet, Rfl: 1 ?  meclizine (ANTIVERT) 25 MG tablet, Take 1 tablet (25 mg total) by mouth 3 (three) times daily as needed for dizziness., Disp: 30 tablet, Rfl: 3 ?  meloxicam (MOBIC) 15 MG tablet, , Disp: , Rfl: 0 ?  omeprazole (PRILOSEC) 40 MG capsule, TAKE 1 CAPSULE BY MOUTH ONCE DAILY NEED  APPT  FOR  REFILLS, Disp: 90 capsule, Rfl: 1 ?  propranolol ER (INDERAL LA) 60 MG 24 hr capsule, Take 1 capsule (60 mg total) by mouth daily., Disp: 90 capsule, Rfl: 1 ?  SYMBICORT 160-4.5 MCG/ACT inhaler, Inhale 2 puffs into the lungs 2 (two) times daily., Disp: 30.6 g, Rfl: 1 ?  triamcinolone cream (KENALOG) 0.1 %, APPLY ONE APPLICATION TWICE DAILY, Disp: 453.6 g, Rfl: 1 ? ?Allergies  ?Allergen Reactions  ? Lisinopril Cough  ? Simvastatin   ?  Leg pain  ? ?Past Medical History:  ?Diagnosis Date  ? Asthma   ? COPD (chronic  obstructive pulmonary disease) ([place])   ? Dyslipidemia   ? GERD (gastroesophageal reflux disease)   ? Hyperlipidemia   ? Hypertension   ? Kidney stones 11/2013  ? ? ?Observations/Objective: ?A&O  ?No respiratory distress or wheezing audible over the phone ?Mood, judgement, and thought processes all WNL ? ?Assessment and Plan: ?1. Strain of lumbar region, initial encounter ?Encouraged use of heat, muscle rubs, Lidoderm patches, and rest. ?- predniSONE (DELTASONE) 20 MG tablet; Take 2 tablets (40 mg total) by mouth daily with breakfast for 5 days.  Dispense: 10 tablet; Refill: 0 ? ? ?Follow Up Instructions: ? ?I discussed the assessment and treatment plan with the patient. The patient was provided an opportunity to ask questions and all  were answered. The patient agreed with the plan and demonstrated an understanding of the instructions. ?  ?The patient was advised to call back or seek an in-person evaluation if the symptoms worsen or if the condition fails to improve as anticipated. ? ?The above assessment and management plan was discussed with the patient. The patient verbalized understanding of and has agreed to the management plan. Patient is aware to call the clinic if symptoms persist or worsen. Patient is aware when to return to the clinic for a follow-up visit. Patient educated on when it is appropriate to go to the emergency department.  ? ?Time call ended: 12:47 PM ? ?I provided 11 minutes of non-face-to-face time during this encounter. ? ?Hendricks Limes, MSN, APRN, FNP-C ?[place] ?03/27/21 ? ? ?

## 2021-04-11 ENCOUNTER — Ambulatory Visit (INDEPENDENT_AMBULATORY_CARE_PROVIDER_SITE_OTHER): Payer: Commercial Managed Care - PPO

## 2021-04-11 ENCOUNTER — Encounter: Payer: Self-pay | Admitting: Nurse Practitioner

## 2021-04-11 ENCOUNTER — Ambulatory Visit: Payer: Commercial Managed Care - PPO | Admitting: Nurse Practitioner

## 2021-04-11 VITALS — BP 147/78 | HR 87 | Temp 98.4°F | Resp 20

## 2021-04-11 DIAGNOSIS — M79671 Pain in right foot: Secondary | ICD-10-CM

## 2021-04-11 NOTE — Progress Notes (Signed)
? ?  Subjective:  ? ? Patient ID: David Ortega, male    DOB: 1960-10-20, 61 y.o.   MRN: [id number] ? ? ?Chief Complaint: foot pain ? ?Foot Pain ?Pertinent negatives include no abdominal pain, chest pain, diaphoresis, headaches, rash or weakness.  ?Patient come sin c/o right foot pai. He was walking across the yard to go  to work this morning and felt a pop in his foot and now cannot walk on it. Pain is on lateral side of his foot. Rates 8/10 when standing or walking. ? ? ? ?Review of Systems  ?Constitutional:  Negative for diaphoresis.  ?Eyes:  Negative for pain.  ?Respiratory:  Negative for shortness of breath.   ?Cardiovascular:  Negative for chest pain, palpitations and leg swelling.  ?Gastrointestinal:  Negative for abdominal pain.  ?Endocrine: Negative for polydipsia.  ?Skin:  Negative for rash.  ?Neurological:  Negative for dizziness, weakness and headaches.  ?Hematological:  Does not bruise/bleed easily.  ?All other systems reviewed and are negative. ? ?   ?Objective:  ? Physical Exam ?Vitals and nursing note reviewed.  ?Constitutional:   ?   Appearance: Normal appearance. He is well-developed.  ?Neck:  ?   Thyroid: No thyroid mass or thyromegaly.  ?   Vascular: No carotid bruit or JVD.  ?   Trachea: Phonation normal.  ?Cardiovascular:  ?   Rate and Rhythm: Normal rate and regular rhythm.  ?Pulmonary:  ?   Effort: Pulmonary effort is normal. No respiratory distress.  ?   Breath sounds: Normal breath sounds.  ?Abdominal:  ?   General: Bowel sounds are normal.  ?   Palpations: Abdomen is soft.  ?   Tenderness: There is no abdominal tenderness.  ?Musculoskeletal:     ?   General: Normal range of motion.  ?   Cervical back: Normal range of motion and neck supple.  ?Lymphadenopathy:  ?   Cervical: No cervical adenopathy.  ?Skin: ?   General: Skin is warm and dry.  ?Neurological:  ?   Mental Status: He is alert and oriented to person, place, and time.  ?Psychiatric:     ?   Behavior: Behavior normal.     ?    Thought Content: Thought content normal.     ?   Judgment: Judgment normal.  ? ? ?BP (!) 147/78   Pulse 87   Temp 98.4 ?F (36.9 ?C) (Temporal)   Resp 20   SpO2 96%  ? ? ? ?   ?Assessment & Plan:  ?HAU SANOR in today with chief complaint of Foot Pain (Right) ? ? ?1. Right foot pain ?fracture shoe ?Rest' limit weight bearing for several days. ?- DG Foot Complete Right ? ? ? ?The above assessment and management plan was discussed with the patient. The patient verbalized understanding of and has agreed to the management plan. Patient is aware to call the clinic if symptoms persist or worsen. Patient is aware when to return to the clinic for a follow-up visit. Patient educated on when it is appropriate to go to the emergency department.  ? ?Mary-Margaret Hassell Done, FNP ? ? ? ?

## 2021-04-11 NOTE — Patient Instructions (Signed)
RICE Therapy for Routine Care of Injuries Many injuries can be cared for with rest, ice, compression, and elevation (RICE therapy). This includes: Resting the injured body part. Putting ice on the injury. Putting pressure (compression) on the injury. Raising the injured part (elevation). Using RICE therapy can help to lessen pain and swelling. Supplies needed: Ice. Plastic bag. Towel. Elastic bandage. Pillow or pillows to raise your injured body part. How to care for your injury with RICE therapy Rest Try to rest the injured part of your body. You can go back to your normal activities when your doctor says it is okay to do them and when you can do themwithout pain. If you rest the injury too much, it may not heal as well. Some injuries heal better with early movement instead of resting for too long. Ask your doctor ifyou should do exercises to help your injury get better. Ice  If told, put ice on the injured area. To do this: Put ice in a plastic bag. Place a towel between your skin and the bag. Leave the ice on for 20 minutes, 2-3 times a day. Take off the ice if your skin turns bright red. This is very important. If you cannot feel pain, heat, or cold, you have a greater risk of damage to the area. Do not put ice on your bare skin. Use ice for as many days as your doctor tells you to use it.  Compression Put pressure on the injured area. This can be done with an elastic bandage. If this type of bandage has been put on your injury: Follow instructions on the package the bandage came in about how to use it. Do not wrap the bandage too tightly. Wrap the bandage more loosely if part of your body beyond the bandage is blue, swollen, cold, painful, or loses feeling. Take off the bandage and put it on again every 3-4 hours or as told by your doctor. See your doctor if the bandage seems to make your problems worse.  Elevation Raise the injured area above the level of your heart while you  are sitting orlying down. Follow these instructions at home: If your symptoms get worse or last a long time, make a follow-up appointment with your doctor. You may need to have imaging tests, such as X-rays or an MRI. If you have imaging tests, ask how to get your results when they are ready. Return to your normal activities when your doctor says that it is safe. Keep all follow-up visits. Contact a doctor if: You keep having pain and swelling. Your symptoms get worse. Get help right away if: You have sudden, very bad pain at your injury or lower than your injury. You have redness or more swelling around your injury. You have tingling or numbness at your injury or lower than your injury, and it does not go away when you take off the bandage. Summary Many injuries can be cared for using rest, ice, compression, and elevation (RICE therapy). You can go back to your normal activities when your doctor says it is okay and when you can do them without pain. Put ice on the injured area as told by your doctor. Get help if your symptoms get worse or if you keep having pain and swelling. This information is not intended to replace advice given to you by your health care provider. Make sure you discuss any questions you have with your healthcare provider. Document Revised: 11/03/2019 Document Reviewed: 11/03/2019 Elsevier Patient Education    2022 Elsevier Inc.  

## 2021-05-01 ENCOUNTER — Other Ambulatory Visit: Payer: Self-pay | Admitting: Nurse Practitioner

## 2021-05-23 ENCOUNTER — Ambulatory Visit: Payer: Commercial Managed Care - PPO | Admitting: Nurse Practitioner

## 2021-05-23 ENCOUNTER — Encounter: Payer: Self-pay | Admitting: Nurse Practitioner

## 2021-05-23 VITALS — BP 124/82 | HR 91 | Temp 98.2°F | Resp 20 | Ht 74.0 in | Wt 326.0 lb

## 2021-05-23 DIAGNOSIS — B9562 Methicillin resistant Staphylococcus aureus infection as the cause of diseases classified elsewhere: Secondary | ICD-10-CM | POA: Diagnosis not present

## 2021-05-23 DIAGNOSIS — L0390 Cellulitis, unspecified: Secondary | ICD-10-CM

## 2021-05-23 MED ORDER — SULFAMETHOXAZOLE-TRIMETHOPRIM 800-160 MG PO TABS: 1.0000 | ORAL_TABLET | Freq: Two times a day (BID) | ORAL | 0 refills | Status: DC

## 2021-05-23 MED ORDER — MUPIROCIN 2 % EX OINT: 1.0000 "application " | TOPICAL_OINTMENT | Freq: Two times a day (BID) | CUTANEOUS | 0 refills | Status: AC

## 2021-05-23 NOTE — Progress Notes (Signed)
? ?  Subjective:  ? ? Patient ID: David Ortega, male    DOB: [birth year], 61 y.o.   MRN: [id number] ? ? ?Chief Complaint: rash ? ? ?Rash ?Pertinent negatives include no eye pain or shortness of breath.  ?Developed a rash  Monday. He says that they are skin lesion that are painful. He has had these in  the past and they were MRSA ? ? ? ? ?Review of Systems  ?Constitutional:  Negative for diaphoresis.  ?Eyes:  Negative for pain.  ?Respiratory:  Negative for shortness of breath.   ?Cardiovascular:  Negative for chest pain, palpitations and leg swelling.  ?Gastrointestinal:  Negative for abdominal pain.  ?Endocrine: Negative for polydipsia.  ?Skin:  Positive for rash.  ?Neurological:  Negative for dizziness, weakness and headaches.  ?Hematological:  Does not bruise/bleed easily.  ?All other systems reviewed and are negative. ? ?   ?Objective:  ? Physical Exam ?Vitals reviewed.  ?Constitutional:   ?   Appearance: Normal appearance.  ?Cardiovascular:  ?   Rate and Rhythm: Normal rate and regular rhythm.  ?   Heart sounds: Normal heart sounds.  ?Pulmonary:  ?   Breath sounds: Normal breath sounds.  ?Skin: ?   Comments: Scattered erythematous lesion with central pustule on abdomen and in groin area.  ?Neurological:  ?   General: No focal deficit present.  ?   Mental Status: He is alert and oriented to person, place, and time.  ?Psychiatric:     ?   Mood and Affect: Mood normal.     ?   Behavior: Behavior normal.  ? ? ?BP 124/82   Pulse 91   Temp 98.2 ?F (36.8 ?C) (Temporal)   Resp 20   Ht '6\' 2"'$  (1.88 m)   Wt (!) 326 lb (147.9 kg)   SpO2 95%   BMI 41.86 kg/m?  ? ? ? ?   ?Assessment & Plan:  ? ?[name] in today with chief complaint of Rash ? ? ?1. MRSA cellulitis ?Do not pick or scratch ?Clean with antibacterial soap ?- sulfamethoxazole-trimethoprim (BACTRIM DS) 800-160 MG tablet; Take 1 tablet by mouth 2 (two) times daily.  Dispense: 20 tablet; Refill: 0 ?- mupirocin ointment (BACTROBAN) 2 %; Apply 1  application. topically 2 (two) times daily.  Dispense: 22 g; Refill: 0 ? ? ? ?The above assessment and management plan was discussed with the patient. The patient verbalized understanding of and has agreed to the management plan. Patient is aware to call the clinic if symptoms persist or worsen. Patient is aware when to return to the clinic for a follow-up visit. Patient educated on when it is appropriate to go to the emergency department.  ? ?Mary-Margaret Hassell Done, FNP ? ? ?

## 2021-05-23 NOTE — Patient Instructions (Signed)
MRSA Infection, Diagnosis, Adult Methicillin-resistant Staphylococcus aureus (MRSA) infection is caused by bacteria called Staphylococcus aureus, or staph, that no longer respond to common antibiotic medicines (drug-resistant bacteria). MRSA infection can be hard to treat. Most of the time, MRSA can be on the skin or in the nose without causing problems (colonized). However, if MRSA enters the body through a cut, a sore, or an invasive medical device, it can cause a serious infection. What are the causes? This condition is caused by staph bacteria. Illness may develop after exposure to the bacteria through: Skin-to-skin contact with someone who is infected with MRSA. Touching surfaces that have the bacteria on them. Having a procedure or using equipment that allows MRSA to enter the body. Having MRSA that lives on your skin and then enters your body through: A cut or scratch. A surgery or procedure. The use of a medical device. Contact with the bacteria may occur: During a stay in a hospital, rehabilitation facility, nursing home, or other health care facility (health care-associated MRSA). In daily activities where there is close contact with others, such as sports, child care centers, or at home (community-associated MRSA). What increases the risk? You are more likely to develop this condition if you: Have a surgery or procedure. Have an IV or a thin tube (catheter) placed in your body. Are elderly. Are on kidney dialysis. Have recently taken an antibiotic medicine. Live in a long-term care facility. Have a chronic wound or skin ulcer. Have a weak body defense system (immune system). Play sports that involve skin-to-skin contact. Live in a crowded place, like a dormitory or military barracks. Share towels, razors, or sports equipment with other people. Have a history of MRSA infection or colonization. What are the signs or symptoms? Symptoms of this condition depend on the area that  is affected. Symptoms may include: A pus-filled pimple or boil. Pus that drains from your skin. A sore (abscess) under your skin or somewhere in your body. Fever with or without chills. Difficulty breathing. Coughing up blood. Redness, warmth, swelling, or pain in the affected area. How is this diagnosed? This condition may be diagnosed based on: A physical exam. Your medical history. Taking a sample from the infected area and growing it in a lab (culture). You may also have other tests, including: Imaging tests, such as X-rays, a CT scan, or an MRI. Lab tests, such as blood, urine, or phlegm (sputum) tests. You skin or nose may be swabbed when you are admitted to a health care facility for a procedure. This is to screen for MRSA. How is this treated? Treatment depends on the type of MRSA infection you have and how severe, deep, or extensive it is. Treatment may include: Antibiotic medicines. Surgery to drain pus from the infected area. Severe infections may require a hospital stay. Follow these instructions at home: Medicines Take over-the-counter and prescription medicines only as told by your health care provider. If you were prescribed an antibiotic medicine, use it as told by your health care provider. Do not stop using the antibiotic even if you start to feel better. Prevention Follow these instructions to avoid spreading the infection to others: Wash your hands frequently with soap and water. If soap and water are not available, use an alcohol-based hand sanitizer. Avoid close contact with those around you as much as possible. Do not use towels, razors, toothbrushes, bedding, or other items that will be used by others. Wash towels, bedding, and clothes in the washing machine with detergent   and hot water. Dry them in a hot dryer. Clean surfaces regularly to remove germs (disinfection). Use products or solutions that contain bleach. Make sure you disinfect bathroom surfaces, food  preparation areas, exercise equipment, and doorknobs.  General instructions If you have a wound, follow instructions from your health care provider about how to take care of your wound. Do not pick at scabs. Do not try to drain any infection sites or pimples. Tell all your health care providers that you have MRSA, or if you have ever had a MRSA infection. Keep all follow-up visits as told by your health care provider. This is important. Contact a health care provider if you: Do not get better. Have symptoms that get worse. Have new symptoms. Get help right away if you have: Nausea or vomiting, or if you cannot take medicine without vomiting. Trouble breathing. Chest pain. These symptoms may represent a serious problem that is an emergency. Do not wait to see if the symptoms will go away. Get medical help right away. Call your local emergency services (911 in the [place] Do not drive yourself to the hospital. Summary MRSA infection is caused by bacteria called Staphylococcus aureus, or staph, that no longer respond to common antibiotic medicines. Treatment for this condition depends on the type of MRSA infection you have and how severe, deep, and extensive it is. If you were prescribed an antibiotic medicine, use it as told by your health care provider. Do not stop using the antibiotic even if you start to feel better. Follow instructions from your health care provider to avoid spreading the infection to others. This information is not intended to replace advice given to you by your health care provider. Make sure you discuss any questions you have with your health care provider. Document Revised: 04/01/2018 Document Reviewed: 04/02/2018 Elsevier Patient Education  2023 Else[employer] Inc.  

## 2021-05-29 ENCOUNTER — Telehealth: Payer: Self-pay | Admitting: Nurse Practitioner

## 2021-05-29 NOTE — Telephone Encounter (Signed)
Patient needs a work note, he was seen on 4/27 for a rash and plans to go back to work tomorrow 5/4. Please call to let him know when the note is ready.  ?

## 2021-05-29 NOTE — Telephone Encounter (Signed)
Letter written and left up front for patient pick up. Patient notified and verbalized understanding 

## 2021-06-07 ENCOUNTER — Encounter: Payer: Self-pay | Admitting: *Deleted

## 2021-06-07 DIAGNOSIS — Z006 Encounter for examination for normal comparison and control in clinical research program: Secondary | ICD-10-CM

## 2021-06-07 NOTE — Research (Signed)
Message left for Mr Ridley about [employer], multimedia. Encouraged him to call me back  ?

## 2021-06-14 ENCOUNTER — Telehealth: Payer: Self-pay

## 2021-06-14 MED ORDER — DOXYCYCLINE HYCLATE 100 MG PO TABS: 100.0000 mg | ORAL_TABLET | Freq: Two times a day (BID) | ORAL | 0 refills | Status: DC

## 2021-06-14 NOTE — Telephone Encounter (Signed)
Patient continues to have lesions that are not healing. Doxycycline 100 BID for 10 days sent to pharmacy

## 2021-06-20 ENCOUNTER — Encounter: Payer: Self-pay | Admitting: Nurse Practitioner

## 2021-06-20 ENCOUNTER — Ambulatory Visit: Payer: Commercial Managed Care - PPO | Admitting: Nurse Practitioner

## 2021-06-20 VITALS — BP 134/87 | HR 78 | Temp 97.9°F | Resp 20 | Ht 74.0 in | Wt 324.0 lb

## 2021-06-20 DIAGNOSIS — Z6839 Body mass index (BMI) 39.0-39.9, adult: Secondary | ICD-10-CM

## 2021-06-20 DIAGNOSIS — R739 Hyperglycemia, unspecified: Secondary | ICD-10-CM

## 2021-06-20 DIAGNOSIS — G4733 Obstructive sleep apnea (adult) (pediatric): Secondary | ICD-10-CM | POA: Diagnosis not present

## 2021-06-20 DIAGNOSIS — J4520 Mild intermittent asthma, uncomplicated: Secondary | ICD-10-CM

## 2021-06-20 DIAGNOSIS — K219 Gastro-esophageal reflux disease without esophagitis: Secondary | ICD-10-CM | POA: Diagnosis not present

## 2021-06-20 DIAGNOSIS — E782 Mixed hyperlipidemia: Secondary | ICD-10-CM

## 2021-06-20 DIAGNOSIS — F3341 Major depressive disorder, recurrent, in partial remission: Secondary | ICD-10-CM

## 2021-06-20 DIAGNOSIS — G43109 Migraine with aura, not intractable, without status migrainosus: Secondary | ICD-10-CM

## 2021-06-20 DIAGNOSIS — I10 Essential (primary) hypertension: Secondary | ICD-10-CM

## 2021-06-20 DIAGNOSIS — R0602 Shortness of breath: Secondary | ICD-10-CM

## 2021-06-20 LAB — BAYER DCA HB A1C WAIVED: HB A1C (BAYER DCA - WAIVED): 5.9 % — ABNORMAL HIGH (ref 4.8–5.6)

## 2021-06-20 MED ORDER — BUPROPION HCL ER (XL) 300 MG PO TB24: 300.0000 mg | ORAL_TABLET | Freq: Every day | ORAL | 1 refills | Status: DC

## 2021-06-20 MED ORDER — ESCITALOPRAM OXALATE 20 MG PO TABS: 20.0000 mg | ORAL_TABLET | Freq: Every day | ORAL | 1 refills | Status: DC

## 2021-06-20 MED ORDER — OMEPRAZOLE 40 MG PO CPDR: DELAYED_RELEASE_CAPSULE | ORAL | 1 refills | Status: DC

## 2021-06-20 MED ORDER — SYMBICORT 160-4.5 MCG/ACT IN AERO: 2.0000 | INHALATION_SPRAY | Freq: Two times a day (BID) | RESPIRATORY_TRACT | 1 refills | Status: DC

## 2021-06-20 MED ORDER — FUROSEMIDE 20 MG PO TABS: 20.0000 mg | ORAL_TABLET | Freq: Every day | ORAL | 1 refills | Status: DC

## 2021-06-20 MED ORDER — LOSARTAN POTASSIUM 100 MG PO TABS: 100.0000 mg | ORAL_TABLET | Freq: Every day | ORAL | 1 refills | Status: DC

## 2021-06-20 MED ORDER — BUSPIRONE HCL 15 MG PO TABS: 15.0000 mg | ORAL_TABLET | Freq: Two times a day (BID) | ORAL | 2 refills | Status: DC

## 2021-06-20 MED ORDER — PROPRANOLOL HCL ER 60 MG PO CP24: 60.0000 mg | ORAL_CAPSULE | Freq: Every day | ORAL | 1 refills | Status: DC

## 2021-06-20 MED ORDER — ATORVASTATIN CALCIUM 40 MG PO TABS: ORAL_TABLET | ORAL | 1 refills | Status: DC

## 2021-06-20 NOTE — Patient Instructions (Signed)

## 2021-06-20 NOTE — Progress Notes (Signed)
Subjective:    Patient ID: David Ortega, male    DOB: August 24, 1960, 61 y.o.   MRN: [id number]   Chief Complaint: medical management of chronic issues     HPI:  David Ortega is a 61 y.o. who identifies as a male who was assigned male at birth.   Social history: Lives with: wife Work history: works on [employer] in today for follow up of the following chronic medical issues:  1. Primary hypertension No c/o chest pain, sob or headache. Does not check blood pressure at home. BP Readings from Last 3 Encounters:  05/23/21 124/82  04/11/21 (!) 147/78  01/04/21 (!) 135/92     2. Mixed hyperlipidemia Does  not watch diet and does no dedicated exercise. Lab Results  Component Value Date   CHOL 203 (H) 01/04/2021   HDL 52 01/04/2021   LDLCALC 104 (H) 01/04/2021   TRIG 278 (H) 01/04/2021   CHOLHDL 3.9 01/04/2021   The 10-year ASCVD risk score (Arnett DK, et al., 2019) is: 16.4%   3. Gastroesophageal reflux disease without esophagitis Is on omeprazole daily and is doing well.  4. OSA (obstructive sleep apnea) Doe snot wear his CPAP at night.  5. Recurrent major depressive disorder, in partial remission ([place]) Is on buspar, wellbutrin and lexapro  and is doing well.     06/20/2021    4:07 PM 12/18/2020    4:12 PM 06/07/2020    3:05 PM  Depression screen PHQ 2/9  Decreased Interest _0 Down, Depressed, Hopeless 0 1 1  PHQ - 2 Score _1 Altered sleeping 1 2 0  Tired, decreased energy _2 Change in appetite 0 0 0  Feeling bad or failure about yourself  0 0 0  Trouble concentrating 0 0 0  Moving slowly or fidgety/restless 0 0 0  Suicidal thoughts 0 0 0  PHQ-9 Score _3 Difficult doing work/chores Somewhat difficult Not difficult at all Not difficult at all     6. BMI 39.0-39.9,adult No recent weight changes Wt Readings from Last 3 Encounters:  06/20/21 (!) 324 lb (147 kg)  05/23/21 (!) 326 lb (147.9 kg)  01/04/21 (!) 324  lb (147 kg)   BMI Readings from Last 3 Encounters:  06/20/21 41.60 kg/m  05/23/21 41.86 kg/m  01/04/21 41.60 kg/m     New complaints: Says that he was dx with asthma /COPD years ago and is on symbicort. He says that he is having wheezing and sob . Says he gets so SOB that he has to physically seat down. He has seen cardiology and had a work up and was told his heart was good. He is a smoker.  Allergies  Allergen Reactions   Lisinopril Cough   Simvastatin     Leg pain   Outpatient Encounter Medications as of 06/20/2021  Medication Sig   albuterol (VENTOLIN HFA) 108 (90 Base) MCG/ACT inhaler INHALE 2 PUFFS BY MOUTH EVERY 6 HOURS AS NEEDED FOR WHEEZING AND FOR SHORTNESS OF BREATH   aspirin 325 MG tablet Take 325 mg by mouth daily.   atorvastatin (LIPITOR) 40 MG tablet TAKE 1 TABLET BY MOUTH ONCE DAILY -NEED  APPT  FOR  MORE  REFILLS-   buPROPion (WELLBUTRIN XL) 300 MG 24 hr tablet Take 1 tablet (300 mg total) by mouth daily.   busPIRone (BUSPAR) 15 MG tablet Take 1 tablet (15 mg total) by mouth 2 (two)  times daily.   doxycycline (VIBRA-TABS) 100 MG tablet Take 1 tablet (100 mg total) by mouth 2 (two) times daily. 1 po bid   escitalopram (LEXAPRO) 20 MG tablet Take 1 tablet (20 mg total) by mouth daily.   furosemide (LASIX) 20 MG tablet Take 20 mg by mouth daily.   losartan (COZAAR) 100 MG tablet Take 1 tablet (100 mg total) by mouth daily. (Needs to be seen before next refill)   meclizine (ANTIVERT) 25 MG tablet Take 1 tablet (25 mg total) by mouth 3 (three) times daily as needed for dizziness.   meloxicam (MOBIC) 15 MG tablet    mupirocin ointment (BACTROBAN) 2 % Apply 1 application. topically 2 (two) times daily.   omeprazole (PRILOSEC) 40 MG capsule TAKE 1 CAPSULE BY MOUTH ONCE DAILY NEED  APPT  FOR  REFILLS   propranolol ER (INDERAL LA) 60 MG 24 hr capsule Take 1 capsule (60 mg total) by mouth daily.   sulfamethoxazole-trimethoprim (BACTRIM DS) 800-160 MG tablet Take 1 tablet by  mouth 2 (two) times daily.   SYMBICORT 160-4.5 MCG/ACT inhaler Inhale 2 puffs into the lungs 2 (two) times daily.   triamcinolone cream (KENALOG) 0.1 % APPLY ONE APPLICATION TWICE DAILY   No facility-administered encounter medications on file as of 06/20/2021.    Past Surgical History:  Procedure Laterality Date   BACK SURGERY     CHOLECYSTECTOMY     COLONOSCOPY N/A 09/05/2016   Procedure: COLONOSCOPY;  Surgeon: Daneil Dolin, MD;  Location: [facility];  Service: Endoscopy;  Laterality: N/A;  2:15 PM   ESOPHAGOGASTRODUODENOSCOPY     LEFT ANKLE SURGERY      Family History  Problem Relation Age of Onset   Diabetes Mother    Heart disease Mother    Diabetes Sister    Cancer Brother    Cancer Sister        pancreatic      Controlled substance contract: n/a     Review of Systems  Constitutional:  Negative for diaphoresis.  Eyes:  Negative for pain.  Respiratory:  Negative for shortness of breath.   Cardiovascular:  Negative for chest pain, palpitations and leg swelling.  Gastrointestinal:  Negative for abdominal pain.  Endocrine: Negative for polydipsia.  Skin:  Negative for rash.  Neurological:  Negative for dizziness, weakness and headaches.  Hematological:  Does not bruise/bleed easily.  All other systems reviewed and are negative.     Objective:   Physical Exam Vitals and nursing note reviewed.  Constitutional:      Appearance: Normal appearance. He is well-developed.  HENT:     Head: Normocephalic.     Nose: Nose normal.     Mouth/Throat:     Mouth: Mucous membranes are moist.     Pharynx: Oropharynx is clear.  Eyes:     Pupils: Pupils are equal, round, and reactive to light.  Neck:     Thyroid: No thyroid mass or thyromegaly.     Vascular: No carotid bruit or JVD.     Trachea: Phonation normal.  Cardiovascular:     Rate and Rhythm: Normal rate and regular rhythm.  Pulmonary:     Effort: Pulmonary effort is normal. No respiratory distress.      Breath sounds: Normal breath sounds.  Abdominal:     General: Bowel sounds are normal.     Palpations: Abdomen is soft.     Tenderness: There is no abdominal tenderness.     Hernia: A hernia (umbilical hernia)  is present.  Musculoskeletal:        General: Normal range of motion.     Cervical back: Normal range of motion and neck supple.     Right lower leg: Edema (1+) present.     Left lower leg: Edema (1+) present.  Lymphadenopathy:     Cervical: No cervical adenopathy.  Skin:    General: Skin is warm and dry.  Neurological:     Mental Status: He is alert and oriented to person, place, and time.  Psychiatric:        Behavior: Behavior normal.        Thought Content: Thought content normal.        Judgment: Judgment normal.    BP 134/87   Pulse 78   Temp 97.9 F (36.6 C) (Temporal)   Resp 20   Ht _0  (1.88 m)   Wt (!) 324 lb (147 kg)   SpO2 96%   BMI 41.60 kg/m   HGBA1c- 5.9%     Assessment & Plan:   David Ortega comes in today with chief complaint of Medical Management of Chronic Issues   Diagnosis and orders addressed:  1. Primary hypertension Low sodium dietl - CBC with Differential/Platelet - CMP14+EGFR - losartan (COZAAR) 100 MG tablet; Take 1 tablet (100 mg total) by mouth daily. (Needs to be seen before next refill)  Dispense: 90 tablet; Refill: 1  2. Mixed hyperlipidemia Lowf diet - Lipid panel - atorvastatin (LIPITOR) 40 MG tablet; TAKE 1 TABLET BY MOUTH ONCE DAILY -NEED  APPT  FOR  MORE  REFILLS-  Dispense: 90 tablet; Refill: 1  3. Gastroesophageal reflux disease without esophagitis Avoid spicy foods Do not eat 2 hours prior to bedtime - omeprazole (PRILOSEC) 40 MG capsule; TAKE 1 CAPSULE BY MOUTH ONCE DAILY NEED  APPT  FOR  REFILLS  Dispense: 90 capsule; Refill: 1  4. OSA (obstructive sleep apnea) Have cpap cleaned so can wer it again  5. Recurrent major depressive disorder, in partial remission ([facility]) Stress management - buPROPion  (WELLBUTRIN XL) 300 MG 24 hr tablet; Take 1 tablet (300 mg total) by mouth daily.  Dispense: 90 tablet; Refill: 1 - escitalopram (LEXAPRO) 20 MG tablet; Take 1 tablet (20 mg total) by mouth daily.  Dispense: 90 tablet; Refill: 1 - busPIRone (BUSPAR) 15 MG tablet; Take 1 tablet (15 mg total) by mouth 2 (two) times daily.  Dispense: 60 tablet; Refill: 2  6. BMI 39.0-39.9,adult Discussed diet and exercise for person with BMI >25 Will recheck weight in 3-6 months   7. Elevated blood sugar Watch carbs in diet - Bayer DCA Hb A1c Waived  8. SOB (shortness of breath) - Ambulatory referral to Pulmonology  9. Asthma, chronic, mild intermittent, uncomplicated - SYMBICORT 734-1.9 MCG/ACT inhaler; Inhale 2 puffs into the lungs 2 (two) times daily.  Dispense: 30.6 g; Refill: 1  10. Migraine with aura and without status migrainosus, not intractable - propranolol ER (INDERAL LA) 60 MG 24 hr capsule; Take 1 capsule (60 mg total) by mouth daily.  Dispense: 90 capsule; Refill: 1   Labs pending Health Maintenance reviewed Diet and exercise encouraged  Follow up plan: 6 months   Mary-Margaret Hassell Done, FNP

## 2021-06-21 LAB — LIPID PANEL
Chol/HDL Ratio: 4.1 ratio (ref 0.0–5.0)
Cholesterol, Total: 219 mg/dL — ABNORMAL HIGH (ref 100–199)
HDL: 54 mg/dL (ref >39)
LDL Chol Calc (NIH): 134 mg/dL — ABNORMAL HIGH (ref 0–99)
Triglycerides: 174 mg/dL — ABNORMAL HIGH (ref 0–149)
VLDL Cholesterol Cal: 31 mg/dL (ref 5–40)

## 2021-06-21 LAB — CBC WITH DIFFERENTIAL/PLATELET
Basophils Absolute: 0 10*3/uL (ref 0.0–0.2)
Basos: 0 %
EOS (ABSOLUTE): 0.3 10*3/uL (ref 0.0–0.4)
Eos: 3 %
Hematocrit: 42.9 % (ref 37.5–51.0)
Hemoglobin: 14.6 g/dL (ref 13.0–17.7)
Immature Grans (Abs): 0 10*3/uL (ref 0.0–0.1)
Immature Granulocytes: 0 %
Lymphocytes Absolute: 2.9 10*3/uL (ref 0.7–3.1)
Lymphs: 32 %
MCH: 32.4 pg (ref 26.6–33.0)
MCHC: 34 g/dL (ref 31.5–35.7)
MCV: 95 fL (ref 79–97)
Monocytes Absolute: 0.6 10*3/uL (ref 0.1–0.9)
Monocytes: 6 %
Neutrophils Absolute: 5.2 10*3/uL (ref 1.4–7.0)
Neutrophils: 59 %
Platelets: 207 10*3/uL (ref 150–450)
RBC: 4.51 x10E6/uL (ref 4.14–5.80)
RDW: 13.4 % (ref 11.6–15.4)
WBC: 9 10*3/uL (ref 3.4–10.8)

## 2021-06-21 LAB — CMP14+EGFR
ALT: 61 IU/L — ABNORMAL HIGH (ref 0–44)
AST: 52 IU/L — ABNORMAL HIGH (ref 0–40)
Albumin/Globulin Ratio: 1.6 (ref 1.2–2.2)
Albumin: 4.2 g/dL (ref 3.8–4.8)
Alkaline Phosphatase: 128 IU/L — ABNORMAL HIGH (ref 44–121)
BUN/Creatinine Ratio: 12 (ref 10–24)
BUN: 13 mg/dL (ref 8–27)
Bilirubin Total: 0.7 mg/dL (ref 0.0–1.2)
CO2: 21 mmol/L (ref 20–29)
Calcium: 9.6 mg/dL (ref 8.6–10.2)
Chloride: 102 mmol/L (ref 96–106)
Creatinine, Ser: 1.05 mg/dL (ref 0.76–1.27)
Globulin, Total: 2.6 g/dL (ref 1.5–4.5)
Glucose: 92 mg/dL (ref 70–99)
Potassium: 4.5 mmol/L (ref 3.5–5.2)
Sodium: 139 mmol/L (ref 134–144)
Total Protein: 6.8 g/dL (ref 6.0–8.5)
eGFR: 80 mL/min/{1.73_m2} (ref >59)

## 2021-06-22 ENCOUNTER — Other Ambulatory Visit: Payer: Self-pay | Admitting: Nurse Practitioner

## 2021-06-27 ENCOUNTER — Other Ambulatory Visit: Payer: Self-pay | Admitting: Nurse Practitioner

## 2021-06-28 MED ORDER — ALBUTEROL SULFATE HFA 108 (90 BASE) MCG/ACT IN AERS
INHALATION_SPRAY | RESPIRATORY_TRACT | 0 refills | Status: DC
Start: 2021-06-28 — End: 2021-08-12

## 2021-06-28 NOTE — Telephone Encounter (Signed)
Refill failed. resent °

## 2021-08-12 ENCOUNTER — Other Ambulatory Visit: Payer: Self-pay | Admitting: Nurse Practitioner

## 2021-08-12 ENCOUNTER — Institutional Professional Consult (permissible substitution): Payer: Self-pay | Admitting: Internal Medicine

## 2021-09-04 ENCOUNTER — Other Ambulatory Visit: Payer: Self-pay | Admitting: Nurse Practitioner

## 2021-09-04 MED ORDER — DOXYCYCLINE HYCLATE 100 MG PO TABS: 100.0000 mg | ORAL_TABLET | Freq: Two times a day (BID) | ORAL | 0 refills | Status: DC

## 2021-12-07 ENCOUNTER — Other Ambulatory Visit: Payer: Self-pay | Admitting: Nurse Practitioner

## 2021-12-07 DIAGNOSIS — J4520 Mild intermittent asthma, uncomplicated: Secondary | ICD-10-CM

## 2021-12-12 ENCOUNTER — Other Ambulatory Visit: Payer: Self-pay | Admitting: Nurse Practitioner

## 2021-12-23 ENCOUNTER — Ambulatory Visit: Payer: Commercial Managed Care - PPO | Admitting: Nurse Practitioner

## 2021-12-23 ENCOUNTER — Encounter: Payer: Self-pay | Admitting: Nurse Practitioner

## 2021-12-23 VITALS — BP 142/78 | HR 88 | Temp 98.5°F | Resp 20 | Ht 74.0 in | Wt 314.0 lb

## 2021-12-23 DIAGNOSIS — Z6839 Body mass index (BMI) 39.0-39.9, adult: Secondary | ICD-10-CM

## 2021-12-23 DIAGNOSIS — R739 Hyperglycemia, unspecified: Secondary | ICD-10-CM

## 2021-12-23 DIAGNOSIS — I10 Essential (primary) hypertension: Secondary | ICD-10-CM | POA: Diagnosis not present

## 2021-12-23 DIAGNOSIS — J4520 Mild intermittent asthma, uncomplicated: Secondary | ICD-10-CM

## 2021-12-23 DIAGNOSIS — F3341 Major depressive disorder, recurrent, in partial remission: Secondary | ICD-10-CM

## 2021-12-23 DIAGNOSIS — K219 Gastro-esophageal reflux disease without esophagitis: Secondary | ICD-10-CM

## 2021-12-23 DIAGNOSIS — E782 Mixed hyperlipidemia: Secondary | ICD-10-CM

## 2021-12-23 DIAGNOSIS — G4733 Obstructive sleep apnea (adult) (pediatric): Secondary | ICD-10-CM

## 2021-12-23 MED ORDER — LOSARTAN POTASSIUM 100 MG PO TABS: 100.0000 mg | ORAL_TABLET | Freq: Every day | ORAL | 1 refills | Status: DC

## 2021-12-23 MED ORDER — ESCITALOPRAM OXALATE 20 MG PO TABS: 20.0000 mg | ORAL_TABLET | Freq: Every day | ORAL | 1 refills | Status: DC

## 2021-12-23 MED ORDER — BUSPIRONE HCL 15 MG PO TABS: 15.0000 mg | ORAL_TABLET | Freq: Two times a day (BID) | ORAL | 2 refills | Status: DC

## 2021-12-23 MED ORDER — SYMBICORT 160-4.5 MCG/ACT IN AERO: 2.0000 | INHALATION_SPRAY | Freq: Two times a day (BID) | RESPIRATORY_TRACT | 0 refills | Status: DC

## 2021-12-23 MED ORDER — OMEPRAZOLE 40 MG PO CPDR: DELAYED_RELEASE_CAPSULE | ORAL | 1 refills | Status: DC

## 2021-12-23 MED ORDER — ATORVASTATIN CALCIUM 40 MG PO TABS: ORAL_TABLET | ORAL | 1 refills | Status: DC

## 2021-12-23 MED ORDER — BUPROPION HCL ER (XL) 300 MG PO TB24: 300.0000 mg | ORAL_TABLET | Freq: Every day | ORAL | 1 refills | Status: DC

## 2021-12-23 MED ORDER — FUROSEMIDE 20 MG PO TABS: 20.0000 mg | ORAL_TABLET | Freq: Every day | ORAL | 1 refills | Status: DC

## 2021-12-23 NOTE — Patient Instructions (Signed)

## 2021-12-23 NOTE — Progress Notes (Signed)
Subjective:    Patient ID: David Ortega, male    DOB: 07/10/1960, 61 y.o.   MRN: [id number]  Chief Complaint: medical management of chronic issues     HPI:  David Ortega is a 61 y.o. who identifies as a male who was assigned male at birth.   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 Primary hypertension No c/o chest pain, sob or headache. Does not check blood pressure at  home. BP Readings from Last 3 Encounters:  06/20/21 134/87  05/23/21 124/82  04/11/21 (!) 147/78     2. Mixed hyperlipidemia Does not watch diet and does no dedicated exercise. Lab Results  Component Value Date   CHOL 219 (H) 06/20/2021   HDL 54 06/20/2021   LDLCALC 134 (H) 06/20/2021   TRIG 174 (H) 06/20/2021   CHOLHDL 4.1 06/20/2021     3. Gastroesophageal reflux disease without esophagitis Is on omeprazole daily and is doing well.  4. Mild intermittent chronic asthma without complication Is on symbicort daily. He still feels like he can take a deep breathe at times.   5. OSA (obstructive sleep apnea) Does not wear his cpap when he sleeps at night. He says he gets sick everytime he wears it.  6. Recurrent major depressive disorder, in partial remission ([place]) I son combination of wellbutrin, lexapro and buspar. Says he is doing well right now.  7. BMI 39.0-39.9,adult No recent weight changes Wt Readings from Last 3 Encounters:  06/20/21 (!) 324 lb (147 kg)  05/23/21 (!) 326 lb (147.9 kg)  01/04/21 (!) 324 lb (147 kg)   BMI Readings from Last 3 Encounters:  06/20/21 41.60 kg/m  05/23/21 41.86 kg/m  01/04/21 41.60 kg/m      New complaints: None today  Allergies  Allergen Reactions   Lisinopril Cough   Simvastatin     Leg pain   Outpatient Encounter Medications as of 12/23/2021  Medication Sig   albuterol (VENTOLIN HFA) 108 (90 Base) MCG/ACT inhaler  INHALE 2 PUFFS BY MOUTH EVERY 6 HOURS AS NEEDED FOR WHEEZING AND FOR SHORTNESS OF BREATH  (NEEDS TO BE SEEN BEFORE NEXT REFILL)   aspirin 325 MG tablet Take 325 mg by mouth daily.   atorvastatin (LIPITOR) 40 MG tablet TAKE 1 TABLET BY MOUTH ONCE DAILY -NEED  APPT  FOR  MORE  REFILLS-   buPROPion (WELLBUTRIN XL) 300 MG 24 hr tablet Take 1 tablet (300 mg total) by mouth daily.   busPIRone (BUSPAR) 15 MG tablet Take 1 tablet (15 mg total) by mouth 2 (two) times daily.   doxycycline (VIBRA-TABS) 100 MG tablet Take 1 tablet (100 mg total) by mouth 2 (two) times daily. 1 po bid   escitalopram (LEXAPRO) 20 MG tablet Take 1 tablet (20 mg total) by mouth daily.   furosemide (LASIX) 20 MG tablet Take 1 tablet (20 mg total) by mouth daily.   losartan (COZAAR) 100 MG tablet Take 1 tablet (100 mg total) by mouth daily. (Needs to be seen before next refill)   meclizine (ANTIVERT) 25 MG tablet Take 1 tablet (25 mg total) by mouth 3 (three) times daily as needed for dizziness.   meloxicam (MOBIC) 15 MG tablet    mupirocin ointment (BACTROBAN) 2 % Apply 1 application. topically 2 (two) times daily.   omeprazole (PRILOSEC) 40 MG capsule TAKE 1 CAPSULE BY MOUTH ONCE DAILY NEED  APPT  FOR  REFILLS   propranolol ER (INDERAL LA) 60 MG 24 hr capsule Take 1 capsule (60 mg total) by mouth daily.   SYMBICORT 160-4.5 MCG/ACT inhaler Inhale 2 puffs by mouth twice daily   triamcinolone cream (KENALOG) 0.1 % APPLY ONE APPLICATION TWICE DAILY   No facility-administered encounter medications on file as of 12/23/2021.    Past Surgical History:  Procedure Laterality Date   BACK SURGERY     CHOLECYSTECTOMY     COLONOSCOPY N/A 09/05/2016   Procedure: COLONOSCOPY;  Surgeon: Daneil Dolin, MD;  Location: [facility];  Service: Endoscopy;  Laterality: N/A;  2:15 PM   ESOPHAGOGASTRODUODENOSCOPY     LEFT ANKLE SURGERY      Family History  Problem Relation Age of Onset   Diabetes Mother    Heart disease Mother     Diabetes Sister    Cancer Brother    Cancer Sister        pancreatic        Review of Systems  Constitutional:  Negative for diaphoresis.  Eyes:  Negative for pain.  Respiratory:  Negative for shortness of breath.   Cardiovascular:  Negative for chest pain, palpitations and leg swelling.  Gastrointestinal:  Negative for abdominal pain.  Endocrine: Negative for polydipsia.  Skin:  Negative for rash.  Neurological:  Negative for dizziness, weakness and headaches.  Hematological:  Does not bruise/bleed easily.  All other systems reviewed and are negative.      Objective:   Physical Exam Vitals and nursing note reviewed.  Constitutional:      Appearance: Normal appearance. He is well-developed.  HENT:     Head: Normocephalic.     Nose: Nose normal.     Mouth/Throat:     Mouth: Mucous membranes are moist.     Pharynx: Oropharynx is clear.  Eyes:     Pupils: Pupils are equal, round, and reactive to light.  Neck:     Thyroid: No thyroid mass or thyromegaly.     Vascular: No carotid bruit or JVD.     Trachea: Phonation normal.  Cardiovascular:     Rate and Rhythm: Normal rate and regular rhythm.  Pulmonary:     Effort: Pulmonary effort is normal. No respiratory distress.     Breath sounds: Normal breath sounds.  Abdominal:     General: Bowel sounds are normal.     Palpations: Abdomen is soft.     Tenderness: There is no abdominal tenderness.  Musculoskeletal:        General: Normal range of motion.     Cervical back: Normal range of motion and neck supple.     Right lower leg: Edema (1+) present.     Left lower leg: Edema (1+) present.  Lymphadenopathy:     Cervical: No cervical adenopathy.  Skin:    General: Skin is warm and dry.  Neurological:     Mental Status: He is alert and oriented to person, place, and time.  Psychiatric:        Behavior: Behavior normal.        Thought Content: Thought content normal.        Judgment: Judgment normal.    BP (!)  142/78   Pulse 88   Temp 98.5 F (36.9 C) (Temporal)   Resp 20   Ht 6' 2" (1.88 m)   Wt (!) 314 lb (142.4 kg)   SpO2 95%   BMI 40.32 kg/m '  Hgba1c 6.4%  Assessment & Plan:   David Ortega comes in today with chief complaint of Medical Management of Chronic Issues and Annual Exam   Diagnosis and orders addressed:  1. Primary hypertension Low sodium diet - CBC with Differential/Platelet - CMP14+EGFR - losartan (COZAAR) 100 MG tablet; Take 1 tablet (100 mg total) by mouth daily. (Needs to be seen before next refill)  Dispense: 90 tablet; Refill: 1 - furosemide (LASIX) 20 MG tablet; Take 1 tablet (20 mg total) by mouth daily.  Dispense: 90 tablet; Refill: 1  2. Mixed hyperlipidemia Low fat diet - Lipid panel - atorvastatin (LIPITOR) 40 MG tablet; TAKE 1 TABLET BY MOUTH ONCE DAILY -NEED  APPT  FOR  MORE  REFILLS-  Dispense: 90 tablet; Refill: 1  3. Gastroesophageal reflux disease without esophagitis Avoid spicy foods Do not eat 2 hours prior to bedtime - omeprazole (PRILOSEC) 40 MG capsule; TAKE 1 CAPSULE BY MOUTH ONCE DAILY NEED  APPT  FOR  REFILLS  Dispense: 90 capsule; Refill: 1  4. Mild intermittent chronic asthma without complication Continue symbicort daily  5. OSA (obstructive sleep apnea) Needs  to clean cpap so it can be used  6. Recurrent major depressive disorder, in partial remission ([facility]CC) Stress management - buPROPion (WELLBUTRIN XL) 300 MG 24 hr tablet; Take 1 tablet (300 mg total) by mouth daily.  Dispense: 90 tablet; Refill: 1 - busPIRone (BUSPAR) 15 MG tablet; Take 1 tablet (15 mg total) by mouth 2 (two) times daily.  Dispense: 60 tablet; Refill: 2 - escitalopram (LEXAPRO) 20 MG tablet; Take 1 tablet (20 mg total) by mouth daily.  Dispense: 90 tablet; Refill: 1  7. BMI 39.0-39.9,adult Discussed diet and exercise for person with BMI >25 Will recheck weight in 3-6 months   8. Elevated blood sugar Watch carbs in diet - Bayer DCA Hb A1c  Waived  9. Asthma, chronic, mild intermittent, uncomplicated  - SYMBICORT 160-4.5 MCG/ACT inhaler; Inhale 2 puffs into the lungs 2 (two) times daily.  Dispense: 33 g; Refill: 0   Labs pending Health Maintenance reviewed Diet and exercise encouraged  Follow up plan: 6 months   Mary-Margaret Hassell Done, FNP

## 2021-12-24 LAB — CBC WITH DIFFERENTIAL/PLATELET
Basophils Absolute: 0 10*3/uL (ref 0.0–0.2)
Basos: 0 %
EOS (ABSOLUTE): 0.2 10*3/uL (ref 0.0–0.4)
Eos: 2 %
Hematocrit: 40.2 % (ref 37.5–51.0)
Hemoglobin: 14.2 g/dL (ref 13.0–17.7)
Immature Grans (Abs): 0.1 10*3/uL (ref 0.0–0.1)
Immature Granulocytes: 1 %
Lymphocytes Absolute: 2.4 10*3/uL (ref 0.7–3.1)
Lymphs: 23 %
MCH: 33.4 pg — ABNORMAL HIGH (ref 26.6–33.0)
MCHC: 35.3 g/dL (ref 31.5–35.7)
MCV: 95 fL (ref 79–97)
Monocytes Absolute: 0.6 10*3/uL (ref 0.1–0.9)
Monocytes: 6 %
Neutrophils Absolute: 6.9 10*3/uL (ref 1.4–7.0)
Neutrophils: 68 %
Platelets: 257 10*3/uL (ref 150–450)
RBC: 4.25 x10E6/uL (ref 4.14–5.80)
RDW: 13.3 % (ref 11.6–15.4)
WBC: 10.2 10*3/uL (ref 3.4–10.8)

## 2021-12-24 LAB — CMP14+EGFR
ALT: 33 IU/L (ref 0–44)
AST: 29 IU/L (ref 0–40)
Albumin/Globulin Ratio: 1.6 (ref 1.2–2.2)
Albumin: 4 g/dL (ref 3.9–4.9)
Alkaline Phosphatase: 134 IU/L — ABNORMAL HIGH (ref 44–121)
BUN/Creatinine Ratio: 11 (ref 10–24)
BUN: 13 mg/dL (ref 8–27)
Bilirubin Total: 0.3 mg/dL (ref 0.0–1.2)
CO2: 22 mmol/L (ref 20–29)
Calcium: 9.6 mg/dL (ref 8.6–10.2)
Chloride: 104 mmol/L (ref 96–106)
Creatinine, Ser: 1.18 mg/dL (ref 0.76–1.27)
Globulin, Total: 2.5 g/dL (ref 1.5–4.5)
Glucose: 124 mg/dL — ABNORMAL HIGH (ref 70–99)
Potassium: 4.7 mmol/L (ref 3.5–5.2)
Sodium: 142 mmol/L (ref 134–144)
Total Protein: 6.5 g/dL (ref 6.0–8.5)
eGFR: 70 mL/min/{1.73_m2} (ref >59)

## 2021-12-24 LAB — LIPID PANEL
Chol/HDL Ratio: 3.6 ratio (ref 0.0–5.0)
Cholesterol, Total: 194 mg/dL (ref 100–199)
HDL: 54 mg/dL (ref >39)
LDL Chol Calc (NIH): 105 mg/dL — ABNORMAL HIGH (ref 0–99)
Triglycerides: 201 mg/dL — ABNORMAL HIGH (ref 0–149)
VLDL Cholesterol Cal: 35 mg/dL (ref 5–40)

## 2021-12-24 LAB — BAYER DCA HB A1C WAIVED: HB A1C (BAYER DCA - WAIVED): 6.4 % — ABNORMAL HIGH (ref 4.8–5.6)

## 2022-01-10 ENCOUNTER — Other Ambulatory Visit: Payer: Self-pay | Admitting: Nurse Practitioner

## 2022-03-10 ENCOUNTER — Other Ambulatory Visit: Payer: Self-pay | Admitting: Nurse Practitioner

## 2022-03-10 DIAGNOSIS — J4520 Mild intermittent asthma, uncomplicated: Secondary | ICD-10-CM

## 2022-03-10 MED ORDER — SYMBICORT 160-4.5 MCG/ACT IN AERO: 2.0000 | INHALATION_SPRAY | Freq: Two times a day (BID) | RESPIRATORY_TRACT | 0 refills | Status: DC

## 2022-03-11 ENCOUNTER — Other Ambulatory Visit: Payer: Self-pay | Admitting: Nurse Practitioner

## 2022-03-11 DIAGNOSIS — J4520 Mild intermittent asthma, uncomplicated: Secondary | ICD-10-CM

## 2022-03-11 MED ORDER — BUDESONIDE-FORMOTEROL FUMARATE 160-4.5 MCG/ACT IN AERO: 2.0000 | INHALATION_SPRAY | Freq: Two times a day (BID) | RESPIRATORY_TRACT | 0 refills | Status: DC

## 2022-04-24 ENCOUNTER — Ambulatory Visit: Payer: Commercial Managed Care - PPO | Admitting: Nurse Practitioner

## 2022-05-15 ENCOUNTER — Encounter: Payer: Self-pay | Admitting: Nurse Practitioner

## 2022-05-15 ENCOUNTER — Ambulatory Visit: Payer: Commercial Managed Care - PPO | Admitting: Nurse Practitioner

## 2022-05-15 VITALS — BP 134/84 | HR 75 | Temp 97.9°F | Resp 20 | Ht 74.0 in | Wt 310.0 lb

## 2022-05-15 DIAGNOSIS — E782 Mixed hyperlipidemia: Secondary | ICD-10-CM | POA: Diagnosis not present

## 2022-05-15 DIAGNOSIS — Z6839 Body mass index (BMI) 39.0-39.9, adult: Secondary | ICD-10-CM

## 2022-05-15 DIAGNOSIS — G4733 Obstructive sleep apnea (adult) (pediatric): Secondary | ICD-10-CM

## 2022-05-15 DIAGNOSIS — R739 Hyperglycemia, unspecified: Secondary | ICD-10-CM

## 2022-05-15 DIAGNOSIS — F3341 Major depressive disorder, recurrent, in partial remission: Secondary | ICD-10-CM

## 2022-05-15 DIAGNOSIS — K219 Gastro-esophageal reflux disease without esophagitis: Secondary | ICD-10-CM | POA: Diagnosis not present

## 2022-05-15 DIAGNOSIS — I10 Essential (primary) hypertension: Secondary | ICD-10-CM

## 2022-05-15 DIAGNOSIS — J4520 Mild intermittent asthma, uncomplicated: Secondary | ICD-10-CM

## 2022-05-15 LAB — BAYER DCA HB A1C WAIVED: HB A1C (BAYER DCA - WAIVED): 6.3 % — ABNORMAL HIGH (ref 4.8–5.6)

## 2022-05-15 MED ORDER — ATORVASTATIN CALCIUM 40 MG PO TABS: ORAL_TABLET | ORAL | 1 refills | Status: DC

## 2022-05-15 MED ORDER — ESCITALOPRAM OXALATE 20 MG PO TABS
20.0000 mg | ORAL_TABLET | Freq: Every day | ORAL | 1 refills | Status: DC
Start: 2022-05-15 — End: 2022-12-08

## 2022-05-15 MED ORDER — BUSPIRONE HCL 15 MG PO TABS
15.0000 mg | ORAL_TABLET | Freq: Two times a day (BID) | ORAL | 2 refills | Status: DC
Start: 2022-05-15 — End: 2022-12-08

## 2022-05-15 MED ORDER — MELOXICAM 15 MG PO TABS: 15.0000 mg | ORAL_TABLET | Freq: Every day | ORAL | 2 refills | Status: DC

## 2022-05-15 MED ORDER — OMEPRAZOLE 40 MG PO CPDR: DELAYED_RELEASE_CAPSULE | ORAL | 1 refills | Status: DC

## 2022-05-15 MED ORDER — FUROSEMIDE 20 MG PO TABS
20.0000 mg | ORAL_TABLET | Freq: Every day | ORAL | 1 refills | Status: DC
Start: 2022-05-15 — End: 2022-06-22

## 2022-05-15 MED ORDER — BUDESONIDE-FORMOTEROL FUMARATE 160-4.5 MCG/ACT IN AERO: 2.0000 | INHALATION_SPRAY | Freq: Two times a day (BID) | RESPIRATORY_TRACT | 3 refills | Status: DC

## 2022-05-15 MED ORDER — LOSARTAN POTASSIUM 100 MG PO TABS: 100.0000 mg | ORAL_TABLET | Freq: Every day | ORAL | 1 refills | Status: DC

## 2022-05-15 MED ORDER — BUPROPION HCL ER (XL) 300 MG PO TB24
300.0000 mg | ORAL_TABLET | Freq: Every day | ORAL | 1 refills | Status: DC
Start: 2022-05-15 — End: 2022-12-08

## 2022-05-15 NOTE — Progress Notes (Signed)
Subjective:    Patient ID: David Ortega, male    DOB: [birth year], 62 y.o.   MRN: [id number]   Chief Complaint: medical management of chronic issues     HPI:  David Ortega is a 62 y.o. who identifies as a male who was assigned male at birth.   Social history: Lives with: wife Work history: works on trucks.   Comes in today for follow up of the following chronic medical issues:  1. Primary hypertension N c/o chest pain, sob or headache. Does not check blood pressure today. BP Readings from Last 3 Encounters:  12/23/21 (!) 142/78  06/20/21 134/87  05/23/21 124/82     2. Mixed hyperlipidemia Does not watch diet does no exercise at all. Lab Results  Component Value Date   CHOL 194 12/23/2021   HDL 54 12/23/2021   LDLCALC 105 (H) 12/23/2021   TRIG 201 (H) 12/23/2021   CHOLHDL 3.6 12/23/2021     3. OSA (obstructive sleep apnea) Does not wear CPAP at night. Can't stand mask on his face  4. Gastroesophageal reflux disease without esophagitis Is on omeprazole  5. Recurrent major depressive disorder, in partial remission Is on lexapro, wellbutrin and buspar. He says he is doing well.    05/15/2022    3:20 PM 12/23/2021    4:31 PM 06/20/2021    4:07 PM  Depression screen PHQ 2/9  Decreased Interest Down, Depressed, Hopeless 0 1 0  PHQ - 2 Score Altered sleeping 3 0 1  Tired, decreased energy Change in appetite 0 0 0  Feeling bad or failure about yourself  0 0 0  Trouble concentrating 0 0 0  Moving slowly or fidgety/restless 0 0 0  Suicidal thoughts 0 0 0  PHQ-9 Score Difficult doing work/chores Somewhat difficult Not difficult at all Somewhat difficult      05/15/2022    3:21 PM 12/23/2021    4:32 PM 06/20/2021    4:08 PM 12/18/2020    4:13 PM  GAD 7 : Generalized Anxiety Score  Nervous, Anxious, on Edge 2 0 2 0  Control/stop worrying 1 0 1 0  Worry too much - different things 2 0 2 1  Trouble relaxing 2 0 1 0   Restless 2 0 1 0  Easily annoyed or irritable 0 0 0 0  Afraid - awful might happen 0 0 0 0  Total GAD 7 Score 9 0 7 1  Anxiety Difficulty Somewhat difficult Not difficult at all Somewhat difficult Not difficult at all      6. BMI 39.0-39.9,adult No recent weight changes Wt Readings from Last 3 Encounters:  05/15/22 (!) 310 lb (140.6 kg)  12/23/21 (!) 314 lb (142.4 kg)  06/20/21 (!) 324 lb (147 kg)   BMI Readings from Last 3 Encounters:  05/15/22 39.80 kg/m  12/23/21 40.32 kg/m  06/20/21 41.60 kg/m      New complaints: None today  Allergies  Allergen Reactions   Lisinopril Cough   Simvastatin     Leg pain   Outpatient Encounter Medications as of 05/15/2022  Medication Sig   albuterol (VENTOLIN HFA) 108 (90 Base) MCG/ACT inhaler INHALE 2 PUFFS BY MOUTH EVERY 6 HOURS AS NEEDED FOR WHEEZING AND  FOR  SHORTNESS  OF  BREATH  NEEDS  TO  BE  SEEN  BEFORE  NEXT  REFILL   aspirin 325 MG tablet Take  325 mg by mouth daily.   atorvastatin (LIPITOR) 40 MG tablet TAKE 1 TABLET BY MOUTH ONCE DAILY -NEED  APPT  FOR  MORE  REFILLS-   budesonide-formoterol (SYMBICORT) 160-4.5 MCG/ACT inhaler Inhale 2 puffs into the lungs 2 (two) times daily.   buPROPion (WELLBUTRIN XL) 300 MG 24 hr tablet Take 1 tablet (300 mg total) by mouth daily.   busPIRone (BUSPAR) 15 MG tablet Take 1 tablet (15 mg total) by mouth 2 (two) times daily.   escitalopram (LEXAPRO) 20 MG tablet Take 1 tablet (20 mg total) by mouth daily.   furosemide (LASIX) 20 MG tablet Take 1 tablet (20 mg total) by mouth daily.   losartan (COZAAR) 100 MG tablet Take 1 tablet (100 mg total) by mouth daily. (Needs to be seen before next refill)   meclizine (ANTIVERT) 25 MG tablet Take 1 tablet (25 mg total) by mouth 3 (three) times daily as needed for dizziness.   meloxicam (MOBIC) 15 MG tablet    mupirocin ointment (BACTROBAN) 2 % Apply 1 application. topically 2 (two) times daily.   omeprazole (PRILOSEC) 40 MG capsule TAKE 1 CAPSULE  BY MOUTH ONCE DAILY NEED  APPT  FOR  REFILLS   triamcinolone cream (KENALOG) 0.1 % APPLY ONE APPLICATION TWICE DAILY   No facility-administered encounter medications on file as of 05/15/2022.    Past Surgical History:  Procedure Laterality Date   BACK SURGERY     CHOLECYSTECTOMY     COLONOSCOPY N/A 09/05/2016   Procedure: COLONOSCOPY;  Surgeon: Corbin Ade, MD;  Location: [facility];  Service: Endoscopy;  Laterality: N/A;  2:15 PM   ESOPHAGOGASTRODUODENOSCOPY     LEFT ANKLE SURGERY      Family History  Problem Relation Age of Onset   Diabetes Mother    Heart disease Mother    Diabetes Sister    Cancer Brother    Cancer Sister        pancreatic      Controlled substance contract: n/a     Review of Systems  Constitutional:  Negative for diaphoresis.  Eyes:  Negative for pain.  Respiratory:  Negative for shortness of breath.   Cardiovascular:  Negative for chest pain, palpitations and leg swelling.  Gastrointestinal:  Negative for abdominal pain.  Endocrine: Negative for polydipsia.  Skin:  Negative for rash.  Neurological:  Negative for dizziness, weakness and headaches.  Hematological:  Does not bruise/bleed easily.  All other systems reviewed and are negative.      Objective:   Physical Exam Vitals and nursing note reviewed.  Constitutional:      Appearance: Normal appearance. He is well-developed.  HENT:     Head: Normocephalic.     Nose: Nose normal.     Mouth/Throat:     Mouth: Mucous membranes are moist.     Pharynx: Oropharynx is clear.  Eyes:     Pupils: Pupils are equal, round, and reactive to light.  Neck:     Thyroid: No thyroid mass or thyromegaly.     Vascular: No carotid bruit or JVD.     Trachea: Phonation normal.  Cardiovascular:     Rate and Rhythm: Normal rate and regular rhythm.  Pulmonary:     Effort: Pulmonary effort is normal. No respiratory distress.     Breath sounds: Normal breath sounds.  Abdominal:     General: Bowel  sounds are normal.     Palpations: Abdomen is soft.     Tenderness: There is no abdominal  tenderness.  Musculoskeletal:        General: Normal range of motion.     Cervical back: Normal range of motion and neck supple.     Right lower leg: Edema (1+) present.     Left lower leg: Edema (1+) present.  Lymphadenopathy:     Cervical: No cervical adenopathy.  Skin:    General: Skin is warm and dry.  Neurological:     Mental Status: He is alert and oriented to person, place, and time.  Psychiatric:        Behavior: Behavior normal.        Thought Content: Thought content normal.        Judgment: Judgment normal.     BP 134/84   Pulse 75   Temp 97.9 F (36.6 C) (Temporal)   Resp 20   Ht 6\' 2"  (1.88 m)   Wt (!) 310 lb (140.6 kg)   SpO2 96%   BMI 39.80 kg/m   HGBA1c 6.3%     Assessment & Plan:   David Ortega comes in today with chief complaint of Medical Management of Chronic Issues   Diagnosis and orders addressed:  1. Primary hypertension LOW SODIUM DIET - CBC with Differential/Platelet - CMP14+EGFR - furosemide (LASIX) 20 MG tablet; Take 1 tablet (20 mg total) by mouth daily.  Dispense: 90 tablet; Refill: 1 - losartan (COZAAR) 100 MG tablet; Take 1 tablet (100 mg total) by mouth daily. (Needs to be seen before next refill)  Dispense: 90 tablet; Refill: 1 - CBC with Differential/Platelet - CMP14+EGFR  2. Mixed hyperlipidemia Low fat diet - Lipid panel - atorvastatin (LIPITOR) 40 MG tablet; TAKE 1 TABLET BY MOUTH ONCE DAILY -NEED  APPT  FOR  MORE  REFILLS-  Dispense: 90 tablet; Refill: 1 - Lipid panel  3. OSA (obstructive sleep apnea) Patient is gone  to find out about inspire  4. Gastroesophageal reflux disease without esophagitis Avoid spicy foods Do not eat 2 hours prior to bedtime  - omeprazole (PRILOSEC) 40 MG capsule; TAKE 1 CAPSULE BY MOUTH ONCE DAILY NEED  APPT  FOR  REFILLS  Dispense: 90 capsule; Refill: 1  5. Recurrent major depressive  disorder, in partial remission Stress management - buPROPion (WELLBUTRIN XL) 300 MG 24 hr tablet; Take 1 tablet (300 mg total) by mouth daily.  Dispense: 90 tablet; Refill: 1 - busPIRone (BUSPAR) 15 MG tablet; Take 1 tablet (15 mg total) by mouth 2 (two) times daily.  Dispense: 60 tablet; Refill: 2 - escitalopram (LEXAPRO) 20 MG tablet; Take 1 tablet (20 mg total) by mouth daily.  Dispense: 90 tablet; Refill: 1  6. BMI 39.0-39.9,adult Discussed diet and exercise for person with BMI >25 Will recheck weight in 3-6 months   7. Elevated blood sugar Watch carbs in diet - Bayer DCA Hb A1c Waived  8. Asthma, chronic, mild intermittent, uncomplicated - budesonide-formoterol (SYMBICORT) 160-4.5 MCG/ACT inhaler; Inhale 2 puffs into the lungs 2 (two) times daily.  Dispense: 33 g; Refill: 3   Labs pending Health Maintenance reviewed Diet and exercise encouraged  Follow up plan: 6 months   Mary-Margaret Daphine Deutscher, FNP

## 2022-05-16 LAB — CMP14+EGFR
ALT: 25 IU/L (ref 0–44)
AST: 36 IU/L (ref 0–40)
Albumin/Globulin Ratio: 1.6 (ref 1.2–2.2)
Albumin: 3.9 g/dL (ref 3.9–4.9)
Alkaline Phosphatase: 138 IU/L — ABNORMAL HIGH (ref 44–121)
BUN/Creatinine Ratio: 13 (ref 10–24)
BUN: 13 mg/dL (ref 8–27)
Bilirubin Total: 0.6 mg/dL (ref 0.0–1.2)
CO2: 24 mmol/L (ref 20–29)
Calcium: 9.3 mg/dL (ref 8.6–10.2)
Chloride: 104 mmol/L (ref 96–106)
Creatinine, Ser: 0.97 mg/dL (ref 0.76–1.27)
Globulin, Total: 2.4 g/dL (ref 1.5–4.5)
Glucose: 157 mg/dL — ABNORMAL HIGH (ref 70–99)
Potassium: 5.1 mmol/L (ref 3.5–5.2)
Sodium: 141 mmol/L (ref 134–144)
Total Protein: 6.3 g/dL (ref 6.0–8.5)
eGFR: 88 mL/min/{1.73_m2} (ref >59)

## 2022-05-16 LAB — CBC WITH DIFFERENTIAL/PLATELET
Basophils Absolute: 0 10*3/uL (ref 0.0–0.2)
Basos: 0 %
EOS (ABSOLUTE): 0.2 10*3/uL (ref 0.0–0.4)
Eos: 3 %
Hematocrit: 41.8 % (ref 37.5–51.0)
Hemoglobin: 13.9 g/dL (ref 13.0–17.7)
Immature Grans (Abs): 0 10*3/uL (ref 0.0–0.1)
Immature Granulocytes: 0 %
Lymphocytes Absolute: 2.1 10*3/uL (ref 0.7–3.1)
Lymphs: 26 %
MCH: 32.3 pg (ref 26.6–33.0)
MCHC: 33.3 g/dL (ref 31.5–35.7)
MCV: 97 fL (ref 79–97)
Monocytes Absolute: 0.4 10*3/uL (ref 0.1–0.9)
Monocytes: 5 %
Neutrophils Absolute: 5.2 10*3/uL (ref 1.4–7.0)
Neutrophils: 66 %
Platelets: 232 10*3/uL (ref 150–450)
RBC: 4.31 x10E6/uL (ref 4.14–5.80)
RDW: 13 % (ref 11.6–15.4)
WBC: 7.9 10*3/uL (ref 3.4–10.8)

## 2022-05-16 LAB — LIPID PANEL
Chol/HDL Ratio: 3.1 ratio (ref 0.0–5.0)
Cholesterol, Total: 169 mg/dL (ref 100–199)
HDL: 54 mg/dL (ref >39)
LDL Chol Calc (NIH): 87 mg/dL (ref 0–99)
Triglycerides: 163 mg/dL — ABNORMAL HIGH (ref 0–149)
VLDL Cholesterol Cal: 28 mg/dL (ref 5–40)

## 2022-06-17 ENCOUNTER — Other Ambulatory Visit: Payer: Self-pay | Admitting: Nurse Practitioner

## 2022-06-19 ENCOUNTER — Telehealth: Payer: Self-pay | Admitting: Nurse Practitioner

## 2022-06-19 ENCOUNTER — Ambulatory Visit (INDEPENDENT_AMBULATORY_CARE_PROVIDER_SITE_OTHER): Payer: Commercial Managed Care - PPO

## 2022-06-19 ENCOUNTER — Encounter: Payer: Self-pay | Admitting: Family Medicine

## 2022-06-19 ENCOUNTER — Ambulatory Visit: Payer: Commercial Managed Care - PPO | Admitting: Family Medicine

## 2022-06-19 VITALS — BP 140/87 | HR 82 | Temp 98.2°F | Ht 74.0 in | Wt 308.6 lb

## 2022-06-19 DIAGNOSIS — R61 Generalized hyperhidrosis: Secondary | ICD-10-CM | POA: Diagnosis not present

## 2022-06-19 DIAGNOSIS — R609 Edema, unspecified: Secondary | ICD-10-CM

## 2022-06-19 DIAGNOSIS — R5381 Other malaise: Secondary | ICD-10-CM

## 2022-06-19 DIAGNOSIS — R0609 Other forms of dyspnea: Secondary | ICD-10-CM | POA: Diagnosis not present

## 2022-06-19 DIAGNOSIS — R5383 Other fatigue: Secondary | ICD-10-CM

## 2022-06-19 LAB — CBC WITH DIFFERENTIAL/PLATELET
Eos: 2 %
Hematocrit: 39.9 % (ref 37.5–51.0)
Hemoglobin: 13.6 g/dL (ref 13.0–17.7)
Immature Grans (Abs): 0.1 10*3/uL (ref 0.0–0.1)
Lymphocytes Absolute: 1.7 10*3/uL (ref 0.7–3.1)
MCHC: 34.1 g/dL (ref 31.5–35.7)
MCV: 98 fL — ABNORMAL HIGH (ref 79–97)
Monocytes Absolute: 0.5 10*3/uL (ref 0.1–0.9)
Neutrophils: 73 %
RBC: 4.08 x10E6/uL — ABNORMAL LOW (ref 4.14–5.80)
RDW: 13.8 % (ref 11.6–15.4)
WBC: 9.5 10*3/uL (ref 3.4–10.8)

## 2022-06-19 LAB — BRAIN NATRIURETIC PEPTIDE

## 2022-06-19 LAB — CMP14+EGFR
AST: 22 IU/L (ref 0–40)
BUN/Creatinine Ratio: 13 (ref 10–24)
BUN: 13 mg/dL (ref 8–27)
Bilirubin Total: 1 mg/dL (ref 0.0–1.2)
Calcium: 9.2 mg/dL (ref 8.6–10.2)
Globulin, Total: 2.6 g/dL (ref 1.5–4.5)
Glucose: 118 mg/dL — ABNORMAL HIGH (ref 70–99)
Potassium: 5 mmol/L (ref 3.5–5.2)
Total Protein: 6.5 g/dL (ref 6.0–8.5)

## 2022-06-19 LAB — TROPONIN T: Troponin T (Highly Sensitive): 19 ng/L (ref 0–22)

## 2022-06-19 LAB — CK ISOENZYMES

## 2022-06-19 MED ORDER — FUROSEMIDE 80 MG PO TABS
80.0000 mg | ORAL_TABLET | Freq: Every day | ORAL | 0 refills | Status: DC
Start: 2022-06-19 — End: 2022-06-22

## 2022-06-19 NOTE — Progress Notes (Signed)
Subjective:  Patient ID: David Ortega, male    DOB: 1960-04-15, 62 y.o.   MRN: [id number]  Patient Care Team: Bennie Pierini, FNP as PCP - General (Nurse Practitioner)   Chief Complaint:  Shortness of Breath (SOB with walking only x 2 days )   HPI: David Ortega is a 62 y.o. male presenting on 06/19/2022 for Shortness of Breath (SOB with walking only x 2 days )   Pt presents today with complaints of DOE, leg swelling, fatigue, diaphoresis, and activity change. This started 2 days ago and continues to worsen. He has been seen by cardiology in the past and told his EF was normal. Denies chest pain with the symptoms. Has not tried anything to relieve the symptoms.   Shortness of Breath This is a new problem. Episode onset: 2 days ago. Associated symptoms include leg swelling, orthopnea and PND. Pertinent negatives include no abdominal pain, chest pain, claudication, coryza, ear pain, fever, headaches, hemoptysis, leg pain, neck pain, rash, rhinorrhea, sore throat, sputum production, swollen glands, syncope, vomiting or wheezing. The symptoms are aggravated by any activity. He has tried nothing for the symptoms.     Relevant past medical, surgical, family, and social history reviewed and updated as indicated.  Allergies and medications reviewed and updated. Data reviewed: Chart in Epic.   Past Medical History:  Diagnosis Date   Asthma    COPD (chronic obstructive pulmonary disease) (HCC)    Dyslipidemia    GERD (gastroesophageal reflux disease)    Hyperlipidemia    Hypertension    Kidney stones 11/2013    Past Surgical History:  Procedure Laterality Date   BACK SURGERY     CHOLECYSTECTOMY     COLONOSCOPY N/A 09/05/2016   Procedure: COLONOSCOPY;  Surgeon: Corbin Ade, MD;  Location: [facility];  Service: Endoscopy;  Laterality: N/A;  2:15 PM   ESOPHAGOGASTRODUODENOSCOPY     LEFT ANKLE SURGERY      Social History   Socioeconomic History   Marital  status: Married    Spouse name: Not on file   Number of children: Not on file   Years of education: Not on file   Highest education level: Never attended school  Occupational History   Not on file  Tobacco Use   Smoking status: Every Day    Packs/day: 1.00    Years: 38.00    Additional pack years: 0.00    Total pack years: 38.00    Types: Cigarettes   Smokeless tobacco: Never  Vaping Use   Vaping Use: Never used  Substance and Sexual Activity   Alcohol use: No   Drug use: No   Sexual activity: Yes  Other Topics Concern   Not on file  Social History Narrative   Not on file   Social Determinants of Health   Financial Resource Strain: Low Risk  (04/20/2022)   Overall Financial Resource Strain (CARDIA)    Difficulty of Paying Living Expenses: Not hard at all  Food Insecurity: No Food Insecurity (04/20/2022)   Hunger Vital Sign    Worried About Running Out of Food in the Last Year: Never true    Ran Out of Food in the Last Year: Never true  Transportation Needs: No Transportation Needs (04/20/2022)   PRAPARE - Administrator, Civil Service (Medical): No    Lack of Transportation (Non-Medical): No  Physical Activity: Unknown (04/20/2022)   Exercise Vital Sign    Days of Exercise per Week:  Patient declined    Minutes of Exercise per Session: Not on file  Stress: No Stress Concern Present (04/20/2022)   [employer] of Occupational Health - Occupational Stress Questionnaire    Feeling of Stress : Only a little  Social Connections: Moderately Integrated (04/20/2022)   Social Connection and Isolation Panel [NHANES]    Frequency of Communication with Friends and Family: More than three times a week    Frequency of Social Gatherings with Friends and Family: More than three times a week    Attends Religious Services: 1 to 4 times per year    Active Member of [employer] or Organizations: No    Attends Banker Meetings: Not on file    Marital Status: Married   Intimate Partner Violence: Not on file    Outpatient Encounter Medications as of 06/19/2022  Medication Sig   albuterol (VENTOLIN HFA) 108 (90 Base) MCG/ACT inhaler INHALE 2 PUFFS BY MOUTH EVERY 6 HOURS AS NEEDED FOR WHEEZING AND SHORTNESS OF BREATH . APPOINTMENT REQUIRED FOR FUTURE REFILLS   aspirin EC 81 MG tablet Take 81 mg by mouth daily. Swallow whole.   atorvastatin (LIPITOR) 40 MG tablet TAKE 1 TABLET BY MOUTH ONCE DAILY -NEED  APPT  FOR  MORE  REFILLS-   budesonide-formoterol (SYMBICORT) 160-4.5 MCG/ACT inhaler Inhale 2 puffs into the lungs 2 (two) times daily.   buPROPion (WELLBUTRIN XL) 300 MG 24 hr tablet Take 1 tablet (300 mg total) by mouth daily.   busPIRone (BUSPAR) 15 MG tablet Take 1 tablet (15 mg total) by mouth 2 (two) times daily.   escitalopram (LEXAPRO) 20 MG tablet Take 1 tablet (20 mg total) by mouth daily.   furosemide (LASIX) 20 MG tablet Take 1 tablet (20 mg total) by mouth daily.   furosemide (LASIX) 80 MG tablet Take 1 tablet (80 mg total) by mouth daily for 3 days.   losartan (COZAAR) 100 MG tablet Take 1 tablet (100 mg total) by mouth daily. (Needs to be seen before next refill)   meclizine (ANTIVERT) 25 MG tablet Take 1 tablet (25 mg total) by mouth 3 (three) times daily as needed for dizziness.   meloxicam (MOBIC) 15 MG tablet    meloxicam (MOBIC) 15 MG tablet Take 1 tablet (15 mg total) by mouth daily.   mupirocin ointment (BACTROBAN) 2 % Apply 1 application. topically 2 (two) times daily.   omeprazole (PRILOSEC) 40 MG capsule TAKE 1 CAPSULE BY MOUTH ONCE DAILY NEED  APPT  FOR  REFILLS   triamcinolone cream (KENALOG) 0.1 % APPLY ONE APPLICATION TWICE DAILY   No facility-administered encounter medications on file as of 06/19/2022.    Allergies  Allergen Reactions   Propranolol Shortness Of Breath   Lisinopril Cough   Simvastatin     Leg pain    Review of Systems  Constitutional:  Positive for activity change, appetite change, diaphoresis and fatigue.  Negative for chills, fever and unexpected weight change.  HENT: Negative.  Negative for ear pain, rhinorrhea and sore throat.   Eyes: Negative.   Respiratory:  Positive for shortness of breath. Negative for apnea, cough, hemoptysis, sputum production, choking, chest tightness, wheezing and stridor.   Cardiovascular:  Positive for orthopnea, leg swelling and PND. Negative for chest pain, palpitations, claudication and syncope.  Gastrointestinal:  Negative for abdominal pain, blood in stool, constipation, diarrhea, nausea and vomiting.  Endocrine: Negative.   Genitourinary:  Negative for decreased urine volume, difficulty urinating, dysuria, frequency and urgency.  Musculoskeletal:  Negative for  arthralgias, myalgias and neck pain.  Skin: Negative.  Negative for rash.  Allergic/Immunologic: Negative.   Neurological:  Negative for dizziness, tremors, seizures, syncope, facial asymmetry, speech difficulty, weakness, light-headedness, numbness and headaches.  Hematological: Negative.   Psychiatric/Behavioral:  Negative for confusion, hallucinations, sleep disturbance and suicidal ideas.   All other systems reviewed and are negative.       Objective:  BP (!) 140/87   Pulse 82   Temp 98.2 F (36.8 C) (Temporal)   Ht 6\' 2"  (1.88 m)   Wt (!) 308 lb 9.6 oz (140 kg)   SpO2 95%   BMI 39.62 kg/m    Wt Readings from Last 3 Encounters:  06/19/22 (!) 308 lb 9.6 oz (140 kg)  05/15/22 (!) 310 lb (140.6 kg)  12/23/21 (!) 314 lb (142.4 kg)    Physical Exam Vitals and nursing note reviewed.  Constitutional:      General: He is not in acute distress.    Appearance: Normal appearance. He is well-developed and well-groomed. He is morbidly obese. He is not ill-appearing, toxic-appearing or diaphoretic.  HENT:     Head: Normocephalic and atraumatic.     Jaw: There is normal jaw occlusion.     Right Ear: Hearing normal.     Left Ear: Hearing normal.     Nose: Nose normal.     Mouth/Throat:      Lips: Pink.     Mouth: Mucous membranes are moist.     Pharynx: Oropharynx is clear. Uvula midline.  Eyes:     General: Lids are normal.     Extraocular Movements: Extraocular movements intact.     Conjunctiva/sclera: Conjunctivae normal.     Pupils: Pupils are equal, round, and reactive to light.  Neck:     Thyroid: No thyroid mass, thyromegaly or thyroid tenderness.     Vascular: No carotid bruit or JVD.     Trachea: Trachea and phonation normal.  Cardiovascular:     Rate and Rhythm: Normal rate and regular rhythm.     Chest Wall: PMI is not displaced.     Pulses: Normal pulses.     Heart sounds: Normal heart sounds. No murmur heard.    No friction rub. No gallop.  Pulmonary:     Effort: Pulmonary effort is normal. Tachypnea present. No respiratory distress.     Breath sounds: Rales (fine in bases) present. No wheezing.  Abdominal:     General: Bowel sounds are normal. There is no distension or abdominal bruit.     Palpations: Abdomen is soft. There is no hepatomegaly or splenomegaly.     Tenderness: There is no abdominal tenderness. There is no right CVA tenderness or left CVA tenderness.     Hernia: No hernia is present.  Musculoskeletal:        General: Normal range of motion.     Cervical back: Normal range of motion and neck supple.     Right lower leg: 2+ Pitting Edema present.     Left lower leg: 2+ Pitting Edema present.  Lymphadenopathy:     Cervical: No cervical adenopathy.  Skin:    General: Skin is warm and dry.     Capillary Refill: Capillary refill takes less than 2 seconds.     Coloration: Skin is not cyanotic, jaundiced or pale.     Findings: No rash.  Neurological:     General: No focal deficit present.     Mental Status: He is alert and oriented to person, place,  and time.     Sensory: Sensation is intact.     Motor: Motor function is intact.     Coordination: Coordination is intact.     Gait: Gait is intact.     Deep Tendon Reflexes: Reflexes are  normal and symmetric.  Psychiatric:        Attention and Perception: Attention and perception normal.        Mood and Affect: Mood and affect normal.        Speech: Speech normal.        Behavior: Behavior normal. Behavior is cooperative.        Thought Content: Thought content normal.        Cognition and Memory: Cognition and memory normal.        Judgment: Judgment normal.     Results for orders placed or performed in visit on 06/19/22  CMP14+EGFR  Result Value Ref Range   Glucose WILL FOLLOW    BUN WILL FOLLOW    Creatinine, Ser WILL FOLLOW    eGFR WILL FOLLOW    BUN/Creatinine Ratio WILL FOLLOW    Sodium WILL FOLLOW    Potassium WILL FOLLOW    Chloride WILL FOLLOW    CO2 WILL FOLLOW    Calcium WILL FOLLOW    Total Protein WILL FOLLOW    Albumin WILL FOLLOW    Globulin, Total WILL FOLLOW    Albumin/Globulin Ratio WILL FOLLOW    Bilirubin Total WILL FOLLOW    Alkaline Phosphatase WILL FOLLOW    AST WILL FOLLOW    ALT WILL FOLLOW   CBC with Differential/Platelet  Result Value Ref Range   WBC 9.5 3.4 - 10.8 x10E3/uL   RBC 4.08 (L) 4.14 - 5.80 x10E6/uL   Hemoglobin 13.6 13.0 - 17.7 g/dL   Hematocrit 09.8 11.9 - 51.0 %   MCV 98 (H) 79 - 97 fL   MCH 33.3 (H) 26.6 - 33.0 pg   MCHC 34.1 31.5 - 35.7 g/dL   RDW 14.7 82.9 - 56.2 %   Platelets 215 150 - 450 x10E3/uL   Neutrophils 73 Not Estab. %   Lymphs 18 Not Estab. %   Monocytes 6 Not Estab. %   Eos 2 Not Estab. %   Basos 0 Not Estab. %   Neutrophils Absolute 7.0 1.4 - 7.0 x10E3/uL   Lymphocytes Absolute 1.7 0.7 - 3.1 x10E3/uL   Monocytes Absolute 0.5 0.1 - 0.9 x10E3/uL   EOS (ABSOLUTE) 0.2 0.0 - 0.4 x10E3/uL   Basophils Absolute 0.0 0.0 - 0.2 x10E3/uL   Immature Granulocytes 1 Not Estab. %   Immature Grans (Abs) 0.1 0.0 - 0.1 x10E3/uL  Brain natriuretic peptide  Result Value Ref Range   BNP WILL FOLLOW   CK isoenzymes (brain, muscle injury)  Result Value Ref Range   Total CK 78 41 - 331 U/L   Macro Type 2  WILL FOLLOW    CK-MM WILL FOLLOW    Macro Type 1 WILL FOLLOW    CK-MB WILL FOLLOW    CK-BB WILL FOLLOW   Troponin T  Result Value Ref Range   Troponin T (Highly Sensitive) 19 0 - 22 ng/L     EKG: SR 76, PR 150 ms, QT 364 ms. Slight ST changes from previous EKG noted in leads V3-V5. No acute ST-T changes. Kari Baars, FNP-C X-Ray: CXR: slight blunting of costophrenic angle on left. Preliminary x-ray reading by Kari Baars, FNP-C, [facility].   Pertinent labs & imaging results that were  available during my care of the patient were reviewed by me and considered in my medical decision making.  Assessment & Plan:  David Ortega was seen today for shortness of breath.  Diagnoses and all orders for this visit:  DOE (dyspnea on exertion) 2+ pitting edema Malaise and fatigue Diaphoresis Concerns for heart failure as pt has DOE, pitting edema, blunted costophrenic angles on CXR, fatigue, and orthopnea. EKG without acute changes but slight ST changes from prior EKG. Echo and stat labs ordered. Will increase lasix to 80 mg for the next 3 days. Aware of red flags which require ED visit. Make follow up to see cardiology as soon as possible. If not able to get in with cardiology, return to office next week for reevaluation and labs.  -     CMP14+EGFR -     CBC with Differential/Platelet -     Brain natriuretic peptide -     ECHOCARDIOGRAM COMPLETE; Future -     furosemide (LASIX) 80 MG tablet; Take 1 tablet (80 mg total) by mouth daily for 3 days. -     Ambulatory referral to Cardiology -     Troponin T, High Sensitivity (hs-TnT) -     CK isoenzymes (brain, muscle injury) -     Troponin T     Continue all other maintenance medications.  Follow up plan: Return in about 1 week (around 06/26/2022), or if symptoms worsen or fail to improve, for DOE, pitting edema. Make follow up with cardiology.   Continue healthy lifestyle choices, including diet (rich in fruits, vegetables, and lean proteins, and  low in salt and simple carbohydrates) and exercise (at least 30 minutes of moderate physical activity daily).   The above assessment and management plan was discussed with the patient. The patient verbalized understanding of and has agreed to the management plan. Patient is aware to call the clinic if they develop any new symptoms or if symptoms persist or worsen. Patient is aware when to return to the clinic for a follow-up visit. Patient educated on when it is appropriate to go to the emergency department.   Kari Baars, FNP-C [facility] [phone]

## 2022-06-19 NOTE — Telephone Encounter (Signed)
Cardiology Dept called stating that they just got a call from pt stating that he was just seen at our office and needed to be seen by them ASAP. Says they dont have any info on why pt needs to be seen or results of his xray and they are not open on Fridays and Monday is a holiday.  Needs notes and results sent to them ASAP.

## 2022-06-20 LAB — CMP14+EGFR
ALT: 28 IU/L (ref 0–44)
Albumin/Globulin Ratio: 1.5 (ref 1.2–2.2)
Albumin: 3.9 g/dL (ref 3.9–4.9)
Alkaline Phosphatase: 134 IU/L — ABNORMAL HIGH (ref 44–121)
CO2: 22 mmol/L (ref 20–29)
Chloride: 102 mmol/L (ref 96–106)
Creatinine, Ser: 1.04 mg/dL (ref 0.76–1.27)
Sodium: 138 mmol/L (ref 134–144)
eGFR: 81 mL/min/{1.73_m2} (ref >59)

## 2022-06-20 LAB — CBC WITH DIFFERENTIAL/PLATELET
Basophils Absolute: 0 10*3/uL (ref 0.0–0.2)
Basos: 0 %
EOS (ABSOLUTE): 0.2 10*3/uL (ref 0.0–0.4)
Immature Granulocytes: 1 %
Lymphs: 18 %
MCH: 33.3 pg — ABNORMAL HIGH (ref 26.6–33.0)
Monocytes: 6 %
Neutrophils Absolute: 7 10*3/uL (ref 1.4–7.0)
Platelets: 215 10*3/uL (ref 150–450)

## 2022-06-20 LAB — CK ISOENZYMES
CK-BB: 0 %
CK-MB: 0 % (ref 0–3)
CK-MM: 100 % (ref 97–100)
Total CK: 78 U/L (ref 41–331)

## 2022-06-21 ENCOUNTER — Observation Stay (HOSPITAL_COMMUNITY)
Admission: EM | Admit: 2022-06-21 | Discharge: 2022-06-22 | Disposition: A | Discharge status: Home / Self Care | Payer: Commercial Managed Care - PPO | Attending: Internal Medicine | Admitting: Internal Medicine

## 2022-06-21 ENCOUNTER — Other Ambulatory Visit: Payer: Self-pay

## 2022-06-21 ENCOUNTER — Emergency Department (HOSPITAL_COMMUNITY): Payer: Commercial Managed Care - PPO

## 2022-06-21 ENCOUNTER — Encounter (HOSPITAL_COMMUNITY): Payer: Self-pay | Admitting: Pharmacy Technician

## 2022-06-21 DIAGNOSIS — R7989 Other specified abnormal findings of blood chemistry: Secondary | ICD-10-CM | POA: Insufficient documentation

## 2022-06-21 DIAGNOSIS — F17210 Nicotine dependence, cigarettes, uncomplicated: Secondary | ICD-10-CM | POA: Diagnosis not present

## 2022-06-21 DIAGNOSIS — I200 Unstable angina: Principal | ICD-10-CM | POA: Insufficient documentation

## 2022-06-21 DIAGNOSIS — E782 Mixed hyperlipidemia: Secondary | ICD-10-CM

## 2022-06-21 DIAGNOSIS — Z79899 Other long term (current) drug therapy: Secondary | ICD-10-CM | POA: Insufficient documentation

## 2022-06-21 DIAGNOSIS — J45909 Unspecified asthma, uncomplicated: Secondary | ICD-10-CM | POA: Insufficient documentation

## 2022-06-21 DIAGNOSIS — R42 Dizziness and giddiness: Secondary | ICD-10-CM

## 2022-06-21 DIAGNOSIS — R079 Chest pain, unspecified: Secondary | ICD-10-CM

## 2022-06-21 DIAGNOSIS — I10 Essential (primary) hypertension: Secondary | ICD-10-CM | POA: Insufficient documentation

## 2022-06-21 DIAGNOSIS — J449 Chronic obstructive pulmonary disease, unspecified: Secondary | ICD-10-CM | POA: Insufficient documentation

## 2022-06-21 DIAGNOSIS — E785 Hyperlipidemia, unspecified: Secondary | ICD-10-CM | POA: Diagnosis present

## 2022-06-21 DIAGNOSIS — R944 Abnormal results of kidney function studies: Secondary | ICD-10-CM | POA: Diagnosis not present

## 2022-06-21 DIAGNOSIS — Z7982 Long term (current) use of aspirin: Secondary | ICD-10-CM | POA: Insufficient documentation

## 2022-06-21 DIAGNOSIS — R002 Palpitations: Secondary | ICD-10-CM | POA: Diagnosis not present

## 2022-06-21 DIAGNOSIS — K219 Gastro-esophageal reflux disease without esophagitis: Secondary | ICD-10-CM

## 2022-06-21 DIAGNOSIS — R0789 Other chest pain: Secondary | ICD-10-CM | POA: Diagnosis present

## 2022-06-21 LAB — CBC
HCT: 45.7 % (ref 39.0–52.0)
HCT: 44.3 % (ref 39.0–52.0)
Hemoglobin: 15.3 g/dL (ref 13.0–17.0)
Hemoglobin: 14.5 g/dL (ref 13.0–17.0)
MCH: 33.3 pg (ref 26.0–34.0)
MCH: 32.2 pg (ref 26.0–34.0)
MCHC: 33.5 g/dL (ref 30.0–36.0)
MCHC: 32.7 g/dL (ref 30.0–36.0)
MCV: 99.3 fL (ref 80.0–100.0)
MCV: 98.2 fL (ref 80.0–100.0)
Platelets: 262 10*3/uL (ref 150–400)
Platelets: 226 10*3/uL (ref 150–400)
RBC: 4.6 MIL/uL (ref 4.22–5.81)
RBC: 4.51 MIL/uL (ref 4.22–5.81)
RDW: 14.6 % (ref 11.5–15.5)
RDW: 14.6 % (ref 11.5–15.5)
WBC: 12.5 10*3/uL — ABNORMAL HIGH (ref 4.0–10.5)
WBC: 12.3 10*3/uL — ABNORMAL HIGH (ref 4.0–10.5)
nRBC: 0 % (ref 0.0–0.2)
nRBC: 0 % (ref 0.0–0.2)

## 2022-06-21 LAB — TROPONIN I (HIGH SENSITIVITY)
Troponin I (High Sensitivity): 7 ng/L (ref <18)
Troponin I (High Sensitivity): 7 ng/L (ref <18)
Troponin I (High Sensitivity): 6 ng/L (ref <18)

## 2022-06-21 LAB — HIV ANTIBODY (ROUTINE TESTING W REFLEX): HIV Screen 4th Generation wRfx: NONREACTIVE

## 2022-06-21 LAB — D-DIMER, QUANTITATIVE: D-Dimer, Quant: 0.36 ug/mL-FEU (ref 0.00–0.50)

## 2022-06-21 LAB — MAGNESIUM: Magnesium: 1.9 mg/dL (ref 1.7–2.4)

## 2022-06-21 LAB — CREATININE, SERUM
Creatinine, Ser: 1.28 mg/dL — ABNORMAL HIGH (ref 0.61–1.24)
GFR, Estimated: >60 mL/min (ref >60)

## 2022-06-21 LAB — BASIC METABOLIC PANEL
Anion gap: 10 (ref 5–15)
BUN: 19 mg/dL (ref 8–23)
CO2: 26 mmol/L (ref 22–32)
Calcium: 9.3 mg/dL (ref 8.9–10.3)
Chloride: 101 mmol/L (ref 98–111)
Creatinine, Ser: 1.21 mg/dL (ref 0.61–1.24)
GFR, Estimated: >60 mL/min (ref >60)
Glucose, Bld: 84 mg/dL (ref 70–99)
Potassium: 4 mmol/L (ref 3.5–5.1)
Sodium: 137 mmol/L (ref 135–145)

## 2022-06-21 MED ORDER — ASPIRIN 81 MG PO CHEW
324.0000 mg | CHEWABLE_TABLET | ORAL | Status: AC
  Administered 2022-06-21: 324 mg via ORAL
  Filled 2022-06-21: qty 4

## 2022-06-21 MED ORDER — ATORVASTATIN CALCIUM 40 MG PO TABS
40.0000 mg | ORAL_TABLET | Freq: Every day | ORAL | Status: DC
  Administered 2022-06-21 – 2022-06-22 (×2): 40 mg via ORAL
  Filled 2022-06-21 (×2): qty 1

## 2022-06-21 MED ORDER — NITROGLYCERIN 0.4 MG SL SUBL: 0.4000 mg | SUBLINGUAL_TABLET | SUBLINGUAL | Status: DC | PRN

## 2022-06-21 MED ORDER — ONDANSETRON HCL 4 MG/2ML IJ SOLN: 4.0000 mg | Freq: Four times a day (QID) | INTRAMUSCULAR | Status: DC | PRN

## 2022-06-21 MED ORDER — ASPIRIN 81 MG PO TBEC
81.0000 mg | DELAYED_RELEASE_TABLET | Freq: Every day | ORAL | Status: DC
  Administered 2022-06-22: 81 mg via ORAL
  Filled 2022-06-21 (×2): qty 1

## 2022-06-21 MED ORDER — ACETAMINOPHEN 325 MG PO TABS: 650.0000 mg | ORAL_TABLET | ORAL | Status: DC | PRN

## 2022-06-21 MED ORDER — HEPARIN SODIUM (PORCINE) 5000 UNIT/ML IJ SOLN
5000.0000 [IU] | Freq: Three times a day (TID) | INTRAMUSCULAR | Status: DC
  Administered 2022-06-21 – 2022-06-22 (×2): 5000 [IU] via SUBCUTANEOUS
  Filled 2022-06-21 (×2): qty 1

## 2022-06-21 MED ORDER — LOSARTAN POTASSIUM 50 MG PO TABS
100.0000 mg | ORAL_TABLET | Freq: Every day | ORAL | Status: DC
  Administered 2022-06-22: 100 mg via ORAL
  Filled 2022-06-21 (×2): qty 2

## 2022-06-21 MED ORDER — ASPIRIN 300 MG RE SUPP: 300.0000 mg | RECTAL | Status: AC

## 2022-06-21 NOTE — H&P (Signed)
Cardiology Admission History and Physical   Patient ID: David Ortega MRN: [id number]; DOB: 03/23/1960   Admission date: 06/21/2022  PCP:  Bennie Pierini, FNP   [facility] Providers Cardiologist:  None        Chief Complaint:  chest pain  Patient Profile:   David Ortega is a 62 y.o. male with PMHx of  OSA (does not wear CPAP), HTN, possible COPD, HLD, Fhx of CAD, HFpEF, active smoker and obesity who is being seen 06/21/2022 for the evaluation of chest pain.  History of Present Illness:   David Ortega has been experiencing worsening shortness of breath for the past week.  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  Initially, he has been noticing increasing lower extremity edema recently he went to his primary care physician on Thursday who recommended increasing Lasix dose.  He felt minimal improvement in symptoms. This morning, he woke up and felt sudden onset of retrosternal chest pressure, non pleuritic, nonradiating with no particular triggers or relievers, associated with nausea and diaphoresis.  The pain was initially 8/10 and since he arrived to the emergency department it has progressively gone down to minimal at this moment.  The total duration of the pain was approximately 8 to 10 hours. Patient was also complaining of occasional palpitations during the episode.  Denies syncope or presyncope.  Patient sleeps in a recliner, denies PND. His father, brother and sister had history of myocardial infarction int heir early 62s.  He is an active smoker for 30 years, close to 1 PPD Blood pressure in the ER has been ranging between 110/68 to 145/81 mmHg. HR 73, RR 14, SaO2 96% Negative D-dimer and troponins x 2.  Past Medical History:  Diagnosis Date   Asthma    COPD (chronic obstructive pulmonary disease) (HCC)    Dyslipidemia    GERD  (gastroesophageal reflux disease)    Hyperlipidemia    Hypertension    Kidney stones 11/2013    Past Surgical History:  Procedure Laterality Date   BACK SURGERY     CHOLECYSTECTOMY     COLONOSCOPY N/A 09/05/2016   Procedure: COLONOSCOPY;  Surgeon: Corbin Ade, MD;  [facility];  Service: Endoscopy;  Laterality: N/A;  2:15 PM   ESOPHAGOGASTRODUODENOSCOPY     LEFT ANKLE SURGERY       Medications Prior to Admission: Prior to Admission medications   Medication Sig Start Date End Date Taking? Authorizing Provider  albuterol (VENTOLIN HFA) 108 (90 Base) MCG/ACT inhaler INHALE 2 PUFFS BY MOUTH EVERY 6 HOURS AS NEEDED FOR WHEEZING AND SHORTNESS OF BREATH . APPOINTMENT REQUIRED FOR FUTURE REFILLS 06/17/22   Bennie Pierini, FNP  aspirin EC 81 MG tablet Take 81 mg by mouth daily. Swallow whole.    [provider]  atorvastatin (LIPITOR) 40 MG tablet TAKE 1 TABLET BY MOUTH ONCE DAILY -NEED  APPT  FOR  MORE  REFILLS- 05/15/22   Bennie Pierini, FNP  budesonide-formoterol (SYMBICORT) 160-4.5 MCG/ACT inhaler Inhale 2 puffs into the lungs 2 (two) times daily. 05/15/22   Daphine Deutscher, Mary-Margaret, FNP  buPROPion (WELLBUTRIN XL) 300 MG 24 hr tablet Take 1 tablet (300 mg total) by mouth daily. 05/15/22   Daphine Deutscher, Mary-Margaret, FNP  busPIRone (BUSPAR) 15 MG tablet Take 1 tablet (15 mg total) by mouth 2 (two) times daily. 05/15/22   Daphine Deutscher, Mary-Margaret,  FNP  escitalopram (LEXAPRO) 20 MG tablet Take 1 tablet (20 mg total) by mouth daily. 05/15/22   Daphine Deutscher Mary-Margaret, FNP  furosemide (LASIX) 20 MG tablet Take 1 tablet (20 mg total) by mouth daily. 05/15/22   Daphine Deutscher Mary-Margaret, FNP  furosemide (LASIX) 80 MG tablet Take 1 tablet (80 mg total) by mouth daily for 3 days. 06/19/22 06/22/22  Sonny Masters, FNP  losartan (COZAAR) 100 MG tablet Take 1 tablet (100 mg total) by mouth daily. (Needs to be seen before next refill) 05/15/22   Bennie Pierini, FNP  meclizine (ANTIVERT)  25 MG tablet Take 1 tablet (25 mg total) by mouth 3 (three) times daily as needed for dizziness. 11/29/19   Daphine Deutscher Mary-Margaret, FNP  meloxicam (MOBIC) 15 MG tablet  06/01/17   [provider]  meloxicam (MOBIC) 15 MG tablet Take 1 tablet (15 mg total) by mouth daily. 05/15/22   Daphine Deutscher Mary-Margaret, FNP  mupirocin ointment (BACTROBAN) 2 % Apply 1 application. topically 2 (two) times daily. 05/23/21   Daphine Deutscher Mary-Margaret, FNP  omeprazole (PRILOSEC) 40 MG capsule TAKE 1 CAPSULE BY MOUTH ONCE DAILY NEED  APPT  FOR  REFILLS 05/15/22   Bennie Pierini, FNP  triamcinolone cream (KENALOG) 0.1 % APPLY ONE APPLICATION TWICE DAILY 04/11/16   Bennie Pierini, FNP     Allergies:    Allergies  Allergen Reactions   Propranolol Shortness Of Breath   Lisinopril Cough   Simvastatin     Leg pain    Social History:   Social History   Socioeconomic History   Marital status: Married    Spouse name: Not on file   Number of children: Not on file   Years of education: Not on file   Highest education level: Never attended school  Occupational History   Not on file  Tobacco Use   Smoking status: Every Day    Packs/day: 1.00    Years: 38.00    Additional pack years: 0.00    Total pack years: 38.00    Types: Cigarettes   Smokeless tobacco: Never  Vaping Use   Vaping Use: Never used  Substance and Sexual Activity   Alcohol use: No   Drug use: No   Sexual activity: Yes  Other Topics Concern   Not on file  Social History Narrative   Not on file   Social Determinants of Health   Financial Resource Strain: Low Risk  (04/20/2022)   Overall Financial Resource Strain (CARDIA)    Difficulty of Paying Living Expenses: Not hard at all  Food Insecurity: No Food Insecurity (04/20/2022)   Hunger Vital Sign    Worried About Running Out of Food in the Last Year: Never true    Ran Out of Food in the Last Year: Never true  Transportation Needs: No Transportation Needs (04/20/2022)    PRAPARE - Administrator, Civil Service (Medical): No    Lack of Transportation (Non-Medical): No  Physical Activity: Unknown (04/20/2022)   Exercise Vital Sign    Days of Exercise per Week: Patient declined    Minutes of Exercise per Session: Not on file  Stress: No Stress Concern Present (04/20/2022)   [employer] of Occupational Health - Occupational Stress Questionnaire    Feeling of Stress : Only a little  Social Connections: Moderately Integrated (04/20/2022)   Social Connection and Isolation Panel [NHANES]    Frequency of Communication with Friends and Family: More than three times a week    Frequency of Social Gatherings  with Friends and Family: More than three times a week    Attends Religious Services: 1 to 4 times per year    Active Member of Clubs or Organizations: No    Attends Engineer, structural: Not on file    Marital Status: Married  Catering manager Violence: Not on file    Family History:  The patient's family history includes Cancer in his brother and sister; Diabetes in his mother and sister; Heart disease in his mother.    ROS:  Please see the history of present illness.  All other ROS reviewed and negative.     Physical Exam/Data:   Vitals:   06/21/22 1930 06/21/22 1945 06/21/22 2000 06/21/22 2015  BP: 120/73 130/77 (!) 128/91 (!) 142/78  Pulse: 64 64 71 64  Resp: 17 14 16 16   Temp:      TempSrc:      SpO2: 95% 95% 94% 95%   No intake or output data in the 24 hours ending 06/21/22 2122    06/19/2022    8:35 AM 05/15/2022    3:25 PM 12/23/2021    4:36 PM  Last 3 Weights  Weight (lbs) 308 lb 9.6 oz 310 lb 314 lb  Weight (kg) 139.98 kg 140.615 kg 142.429 kg     There is no height or weight on file to calculate BMI.  General:  Well nourished, well developed, in no acute distress HEENT: normal Neck: no JVD Vascular: No carotid bruits; Distal pulses 2+ bilaterally   Cardiac:  normal S1, S2; RRR; no murmur  Lungs:  clear to  auscultation bilaterally, no wheezing, rhonchi or rales  Abd: soft, nontender, no hepatomegaly  Ext: no edema Musculoskeletal:  No deformities, BUE and BLE strength normal and equal Skin: warm and dry  Neuro:  CNs 2-12 intact, no focal abnormalities noted Psych:  Normal affect    EKG:  The ECG that was done, was personally reviewed and demonstrates NSR, LAE   Laboratory Data:  High Sensitivity Troponin:   Recent Labs  Lab 06/21/22 1414 06/21/22 1624  TROPONINIHS 7 7      Chemistry Recent Labs  Lab 06/19/22 0940 06/21/22 1414 06/21/22 1624  NA 138 137  --   K 5.0 4.0  --   CL 102 101  --   CO2 22 26  --   GLUCOSE 118* 84  --   BUN 13 19  --   CREATININE 1.04 1.21  --   CALCIUM 9.2 9.3  --   MG  --   --  1.9  GFRNONAA  --  >60  --   ANIONGAP  --  10  --     Recent Labs  Lab 06/19/22 0940  PROT 6.5  ALBUMIN 3.9  AST 22  ALT 28  ALKPHOS 134*  BILITOT 1.0   Lipids No results for input(s): "CHOL", "TRIG", "HDL", "LABVLDL", "LDLCALC", "CHOLHDL" in the last 168 hours. Hematology Recent Labs  Lab 06/19/22 0940 06/21/22 1414  WBC 9.5 12.5*  RBC 4.08* 4.60  HGB 13.6 15.3  HCT 39.9 45.7  MCV 98* 99.3  MCH 33.3* 33.3  MCHC 34.1 33.5  RDW 13.8 14.6  PLT 215 262   Thyroid No results for input(s): "TSH", "FREET4" in the last 168 hours. BNP Recent Labs  Lab 06/19/22 0940  BNP 14.6    DDimer  Recent Labs  Lab 06/21/22 1525  DDIMER 0.36     Radiology/Studies:  DG Chest 2 View  Result Date: 06/21/2022  CLINICAL DATA:  Central chest pressure with shortness of breath. EXAM: CHEST - 2 VIEW COMPARISON:  06/19/2018 for FINDINGS: Cardiac silhouette and mediastinal contours within normal limits. Mild calcification within the aortic arch. Mild lateral left mid lung curvilinear scarring is unchanged. No acute airspace opacity. Mild biapical pleural thickening/scarring is unchanged. No pleural effusion or thorax. Moderate multilevel degenerative changes of the  thoracic spine. IMPRESSION: 1. No active cardiopulmonary disease. 2. Mild left mid lung scarring, unchanged. Electronically Signed   By: Neita Garnet M.D.   On: 06/21/2022 14:59     Assessment and Plan:   62 y.o. male with PMHx of  OSA (does not wear CPAP), HTN, possible COPD, HLD, Fhx of CAD, HFpEF, active smoker and obesity who presented to the ER complaining of retrosternal chest pain. Patient had concerning symptoms and has several risk factors for CAD. However, he had no troponin elevation or ECG changes despite having 8-10 hrs of pain.  # Chest pain # Palpitations # HFpEF # Family history of CAD # Active smoker 1 PPD # Hypertension and hyperlipidemia - Symptoms of retrosternal chest pressure with diaphoresis - Negative troponins x 2, d-dimer -ve - ECG with NSR and non specific ST changes - CXR with air trapping and left mid lung scarring Plan: - Given his multiple risk factors and character of the pain will plan for ischemic evaluation with stress test. NPO after midnight - Telemetry monitoring - Recheck troponins and NT-proBNP - Echocardiogram in the morning - Lipid panel, Lp(a), HbA1c - Smoking cessation - Continue ASA 81 mg daily - Atorvastatin 40 mg daily - Will hold off on lasix for now given euvolemic on exam - Continue losartan 100 mg daily - If telemetry is unrevealing will do cardiac monitor on discharge   # OSA not on CPAP - Counseled regarding CPAP use  # Obesity  Risk Assessment/Risk Scores:     Severity of Illness: The appropriate patient status for this patient is OBSERVATION. Observation status is judged to be reasonable and necessary in order to provide the required intensity of service to ensure the patient's safety. The patient's presenting symptoms, physical exam findings, and initial radiographic and laboratory data in the context of their medical condition is felt to place them at decreased risk for further clinical deterioration. Furthermore, it is  anticipated that the patient will be medically stable for discharge from the hospital within 2 midnights of admission.    For questions or updates, please contact  [facility] Please consult [email or web address] for contact info under     Signed, Regan Rakers, MD  06/21/2022 9:22 PM

## 2022-06-21 NOTE — ED Notes (Signed)
ED TO INPATIENT HANDOFF REPORT  ED Nurse Name and Phone #: Murlean Iba PM [phone]  S Name/Age/Gender David Ortega 62 y.o. male Room/Bed: 017C/017C  Code Status   Code Status: Full Code  Home/SNF/Other Home Patient oriented to: self, place, time, and situation Is this baseline? Yes   Triage Complete: Triage complete  Chief Complaint Unstable angina (HCC) [I20.0]  Triage Note Pt here with reports of L sided chest pressure onset approx 2 hours pta along with feeling like his heart is racing and endorses feeling lightheaded. Took 2 aspirin prior to leaving to come to the ED.    Allergies Allergies  Allergen Reactions   Propranolol Shortness Of Breath   Lisinopril Cough   Simvastatin     Leg pain    Level of Care/Admitting Diagnosis ED Disposition     ED Disposition  Admit   Condition  --   Comment  Hospital Area: [facility] [[id number]]  Level of Care: Telemetry Cardiac [103]  May place patient in observation at [facility] or [facility] if equivalent level of care is available:: Yes  Covid Evaluation: Asymptomatic - no recent exposure (last 10 days) testing not required  Diagnosis: Unstable angina [facility]) [[id number]]  Admitting Physician: Regan Rakers [[id number]]  Attending Physician: Regan Rakers [[id number]]          B Medical/Surgery History Past Medical History:  Diagnosis Date   Asthma    COPD (chronic obstructive pulmonary disease) (HCC)    Dyslipidemia    GERD (gastroesophageal reflux disease)    Hyperlipidemia    Hypertension    Kidney stones 11/2013   Past Surgical History:  Procedure Laterality Date   BACK SURGERY     CHOLECYSTECTOMY     COLONOSCOPY N/A 09/05/2016   Procedure: COLONOSCOPY;  Surgeon: Corbin Ade, MD;  [facility];  Service: Endoscopy;  Laterality: N/A;  2:15 PM   ESOPHAGOGASTRODUODENOSCOPY     LEFT ANKLE SURGERY       A IV Location/Drains/Wounds Patient  Lines/Drains/Airways Status     Active Line/Drains/Airways     Name Placement date Placement time Site Days   Peripheral IV 06/21/22 20 G Anterior;Left Forearm 06/21/22  1453  Forearm  less than 1            Intake/Output Last 24 hours No intake or output data in the 24 hours ending 06/21/22 2122  Labs/Imaging Results for orders placed or performed during the hospital encounter of 06/21/22 (from the past 48 hour(s))  Basic metabolic panel     Status: None   Collection Time: 06/21/22  2:14 PM  Result Value Ref Range   Sodium 137 135 - 145 mmol/L   Potassium 4.0 3.5 - 5.1 mmol/L   Chloride 101 98 - 111 mmol/L   CO2 26 22 - 32 mmol/L   Glucose, Bld 84 70 - 99 mg/dL    Comment: Glucose reference range applies only to samples taken after fasting for at least 8 hours.   BUN 19 8 - 23 mg/dL   Creatinine, Ser 0.86 0.61 - 1.24 mg/dL   Calcium 9.3 8.9 - 57.8 mg/dL   GFR, Estimated >46 >96 mL/min    Comment: (NOTE) Calculated using the CKD-EPI Creatinine Equation (2021)    Anion gap 10 5 - 15    Comment: Performed at [facility], [address] [place], [place] [address]  CBC     Status: Abnormal   Collection Time:  06/21/22  2:14 PM  Result Value Ref Range   WBC 12.5 (H) 4.0 - 10.5 K/uL   RBC 4.60 4.22 - 5.81 MIL/uL   Hemoglobin 15.3 13.0 - 17.0 g/dL   HCT 16.1 09.6 - 04.5 %   MCV 99.3 80.0 - 100.0 fL   MCH 33.3 26.0 - 34.0 pg   MCHC 33.5 30.0 - 36.0 g/dL   RDW 40.9 81.1 - 91.4 %   Platelets 262 150 - 400 K/uL   nRBC 0.0 0.0 - 0.2 %    Comment: Performed at [facility], [address], [place], [place] [address]  Troponin I (High Sensitivity)     Status: None   Collection Time: 06/21/22  2:14 PM  Result Value Ref Range   Troponin I (High Sensitivity) 7 <18 ng/L    Comment: (NOTE) Elevated high sensitivity troponin I (hsTnI) values and significant  changes across serial measurements may suggest ACS but many other  chronic and acute conditions are known to  elevate hsTnI results.  Refer to the "Links" section for chest pain algorithms and additional  guidance. Performed at [facility], [address], [place], [place] [address]   D-dimer, quantitative     Status: None   Collection Time: 06/21/22  3:25 PM  Result Value Ref Range   D-Dimer, Quant 0.36 0.00 - 0.50 ug/mL-FEU    Comment: (NOTE) At the manufacturer cut-off value of 0.5 g/mL FEU, this assay has a negative predictive value of 95-100%.This assay is intended for use in conjunction with a clinical pretest probability (PTP) assessment model to exclude pulmonary embolism (PE) and deep venous thrombosis (DVT) in outpatients suspected of PE or DVT. Results should be correlated with clinical presentation. Performed at [facility], [address] [place], [place] [address]   Magnesium     Status: None   Collection Time: 06/21/22  4:24 PM  Result Value Ref Range   Magnesium 1.9 1.7 - 2.4 mg/dL    Comment: Performed at [facility], [address] [place], [place] [address]  Troponin I (High Sensitivity)     Status: None   Collection Time: 06/21/22  4:24 PM  Result Value Ref Range   Troponin I (High Sensitivity) 7 <18 ng/L    Comment: (NOTE) Elevated high sensitivity troponin I (hsTnI) values and significant  changes across serial measurements may suggest ACS but many other  chronic and acute conditions are known to elevate hsTnI results.  Refer to the "Links" section for chest pain algorithms and additional  guidance. Performed at [facility], [address], [place], [place] [address]    DG Chest 2 View  Result Date: 06/21/2022 CLINICAL DATA:  Central chest pressure with shortness of breath. EXAM: CHEST - 2 VIEW COMPARISON:  06/19/2018 for FINDINGS: Cardiac silhouette and mediastinal contours within normal limits. Mild calcification within the aortic arch. Mild lateral left mid lung curvilinear scarring is unchanged. No acute airspace opacity. Mild  biapical pleural thickening/scarring is unchanged. No pleural effusion or thorax. Moderate multilevel degenerative changes of the thoracic spine. IMPRESSION: 1. No active cardiopulmonary disease. 2. Mild left mid lung scarring, unchanged. Electronically Signed   By: Neita Garnet M.D.   On: 06/21/2022 14:59    Pending Labs Unresulted Labs (From admission, onward)     Start     Ordered   06/22/22 0500  Lipoprotein A (LPA)  Tomorrow morning,   R  06/21/22 2109   06/22/22 0500  Basic metabolic panel  Tomorrow morning,   R        06/21/22 2109   06/22/22 0500  Lipid panel  Tomorrow morning,   R        06/21/22 2109   06/22/22 0500  CBC  Tomorrow morning,   R        06/21/22 2109   06/22/22 0500  Hemoglobin A1c  Tomorrow morning,   R        06/21/22 2109   06/22/22 0500  TSH  Tomorrow morning,   R        06/21/22 2109   06/22/22 0500  T4, free  Tomorrow morning,   R        06/21/22 2109   06/21/22 2105  CBC  (heparin)  Once,   R       Comments: Baseline for heparin therapy IF NOT ALREADY DRAWN.  Notify MD if PLT < 100 K.    06/21/22 2109   06/21/22 2105  Creatinine, serum  (heparin)  Once,   R       Comments: Baseline for heparin therapy IF NOT ALREADY DRAWN.    06/21/22 2109   06/21/22 2104  HIV Antibody (routine testing w rflx)  (HIV Antibody (Routine testing w reflex) panel)  Once,   R        06/21/22 2109            Vitals/Pain Today's Vitals   06/21/22 1930 06/21/22 1945 06/21/22 2000 06/21/22 2015  BP: 120/73 130/77 (!) 128/91 (!) 142/78  Pulse: 64 64 71 64  Resp: 17 14 16 16   Temp:      TempSrc:      SpO2: 95% 95% 94% 95%  PainSc:        Isolation Precautions No active isolations  Medications Medications  aspirin EC tablet 81 mg (has no administration in time range)  nitroGLYCERIN (NITROSTAT) SL tablet 0.4 mg (has no administration in time range)  acetaminophen (TYLENOL) tablet 650 mg (has no administration in time range)  ondansetron (ZOFRAN)  injection 4 mg (has no administration in time range)  heparin injection 5,000 Units (5,000 Units Subcutaneous Given 06/21/22 2119)  atorvastatin (LIPITOR) tablet 40 mg (has no administration in time range)  losartan (COZAAR) tablet 100 mg (has no administration in time range)  aspirin chewable tablet 324 mg (324 mg Oral Given 06/21/22 2118)    Or  aspirin suppository 300 mg ( Rectal See Alternative 06/21/22 2118)    Mobility walks     Focused Assessments Cardiac Assessment Handoff:    Lab Results  Component Value Date   CKTOTAL 78 06/19/2022   CKMB 0 06/19/2022   Lab Results  Component Value Date   DDIMER 0.36 06/21/2022   Does the Patient currently have chest pain? No    R Recommendations: See Admitting Provider Note  Report given to:   Additional Notes:

## 2022-06-21 NOTE — ED Provider Notes (Signed)
[place] [facility] AT [facility] Provider Note   CSN: [id number] Arrival date & time: 06/21/22  1406     History  Chief Complaint  Patient presents with   Chest Pain    David Ortega is a 62 y.o. male.  Patient is a 62 year old male with a past medical history of hypertension, COPD and hyperlipidemia presenting to the emergency department with chest pain.  Patient states that last week he was having dyspnea on exertion with a little lower extremity swelling and was seen by his primary doctor on Thursday and had his Lasix increased for 3 days.  He states that his shortness of breath improved however this morning started to develop chest pressure.  He states he was sitting at rest when the symptoms started.  He states that the pressure has been constant.  He states that he has associated heart palpitations and diaphoresis.  He states that he was diaphoretic earlier in the week with his shortness of breath that initially improved and then again worsened today.  He denies any nausea or vomiting, fever or cough or lower extremity swelling.  He states it has been several years since his last cardiac stress test.  The history is provided by the patient and the spouse.  Chest Pain      Home Medications Prior to Admission medications   Medication Sig Start Date End Date Taking? Authorizing Provider  albuterol (VENTOLIN HFA) 108 (90 Base) MCG/ACT inhaler INHALE 2 PUFFS BY MOUTH EVERY 6 HOURS AS NEEDED FOR WHEEZING AND SHORTNESS OF BREATH . APPOINTMENT REQUIRED FOR FUTURE REFILLS 06/17/22   Bennie Pierini, FNP  aspirin EC 81 MG tablet Take 81 mg by mouth daily. Swallow whole.    [provider]  atorvastatin (LIPITOR) 40 MG tablet TAKE 1 TABLET BY MOUTH ONCE DAILY -NEED  APPT  FOR  MORE  REFILLS- 05/15/22   Bennie Pierini, FNP  budesonide-formoterol (SYMBICORT) 160-4.5 MCG/ACT inhaler Inhale 2 puffs into the lungs 2 (two) times daily. 05/15/22    Daphine Deutscher, Mary-Margaret, FNP  buPROPion (WELLBUTRIN XL) 300 MG 24 hr tablet Take 1 tablet (300 mg total) by mouth daily. 05/15/22   Daphine Deutscher, Mary-Margaret, FNP  busPIRone (BUSPAR) 15 MG tablet Take 1 tablet (15 mg total) by mouth 2 (two) times daily. 05/15/22   Daphine Deutscher, Mary-Margaret, FNP  escitalopram (LEXAPRO) 20 MG tablet Take 1 tablet (20 mg total) by mouth daily. 05/15/22   Daphine Deutscher Mary-Margaret, FNP  furosemide (LASIX) 20 MG tablet Take 1 tablet (20 mg total) by mouth daily. 05/15/22   Daphine Deutscher Mary-Margaret, FNP  furosemide (LASIX) 80 MG tablet Take 1 tablet (80 mg total) by mouth daily for 3 days. 06/19/22 06/22/22  Sonny Masters, FNP  losartan (COZAAR) 100 MG tablet Take 1 tablet (100 mg total) by mouth daily. (Needs to be seen before next refill) 05/15/22   Bennie Pierini, FNP  meclizine (ANTIVERT) 25 MG tablet Take 1 tablet (25 mg total) by mouth 3 (three) times daily as needed for dizziness. 11/29/19   Daphine Deutscher Mary-Margaret, FNP  meloxicam (MOBIC) 15 MG tablet  06/01/17   [provider]  meloxicam (MOBIC) 15 MG tablet Take 1 tablet (15 mg total) by mouth daily. 05/15/22   Daphine Deutscher Mary-Margaret, FNP  mupirocin ointment (BACTROBAN) 2 % Apply 1 application. topically 2 (two) times daily. 05/23/21   Daphine Deutscher Mary-Margaret, FNP  omeprazole (PRILOSEC) 40 MG capsule TAKE 1 CAPSULE BY MOUTH ONCE DAILY NEED  APPT  FOR  REFILLS 05/15/22  Daphine Deutscher, Mary-Margaret, FNP  triamcinolone cream (KENALOG) 0.1 % APPLY ONE APPLICATION TWICE DAILY 04/11/16   Daphine Deutscher Mary-Margaret, FNP      Allergies    Propranolol, Lisinopril, and Simvastatin    Review of Systems   Review of Systems  Cardiovascular:  Positive for chest pain.    Physical Exam Updated Vital Signs BP (!) 145/81   Pulse 73   Temp 98 F (36.7 C) (Oral)   Resp 14   SpO2 96%  Physical Exam Vitals and nursing note reviewed.  Constitutional:      General: He is not in acute distress.    Appearance: He is well-developed. He is obese. He  is diaphoretic.  HENT:     Head: Normocephalic and atraumatic.  Eyes:     Extraocular Movements: Extraocular movements intact.  Cardiovascular:     Rate and Rhythm: Normal rate and regular rhythm.     Pulses:          Radial pulses are 2+ on the right side and 2+ on the left side.     Heart sounds: Normal heart sounds.  Pulmonary:     Effort: Pulmonary effort is normal.     Breath sounds: Normal breath sounds.  Abdominal:     Palpations: Abdomen is soft.     Tenderness: There is no abdominal tenderness.  Musculoskeletal:        General: Normal range of motion.     Cervical back: Normal range of motion and neck supple.     Right lower leg: No edema.     Left lower leg: No edema.  Skin:    General: Skin is warm.  Neurological:     General: No focal deficit present.     Mental Status: He is alert and oriented to person, place, and time.  Psychiatric:        Mood and Affect: Mood normal.        Behavior: Behavior normal.     ED Results / Procedures / Treatments   Labs (all labs ordered are listed, but only abnormal results are displayed) Labs Reviewed  CBC - Abnormal; Notable for the following components:      Result Value   WBC 12.5 (*)    All other components within normal limits  BASIC METABOLIC PANEL  MAGNESIUM  D-DIMER, QUANTITATIVE  HIV ANTIBODY (ROUTINE TESTING W REFLEX)  LIPOPROTEIN A (LPA)  CBC  CREATININE, SERUM  BASIC METABOLIC PANEL  LIPID PANEL  CBC  HEMOGLOBIN A1C  TSH  T4, FREE  TROPONIN I (HIGH SENSITIVITY)  TROPONIN I (HIGH SENSITIVITY)  TROPONIN I (HIGH SENSITIVITY)    EKG None  Radiology DG Chest 2 View  Result Date: 06/21/2022 CLINICAL DATA:  Central chest pressure with shortness of breath. EXAM: CHEST - 2 VIEW COMPARISON:  06/19/2018 for FINDINGS: Cardiac silhouette and mediastinal contours within normal limits. Mild calcification within the aortic arch. Mild lateral left mid lung curvilinear scarring is unchanged. No acute airspace  opacity. Mild biapical pleural thickening/scarring is unchanged. No pleural effusion or thorax. Moderate multilevel degenerative changes of the thoracic spine. IMPRESSION: 1. No active cardiopulmonary disease. 2. Mild left mid lung scarring, unchanged. Electronically Signed   By: Neita Garnet M.D.   On: 06/21/2022 14:59    Procedures Procedures    Medications Ordered in ED Medications  aspirin EC tablet 81 mg (has no administration in time range)  nitroGLYCERIN (NITROSTAT) SL tablet 0.4 mg (has no administration in time range)  acetaminophen (TYLENOL) tablet 650  mg (has no administration in time range)  ondansetron (ZOFRAN) injection 4 mg (has no administration in time range)  heparin injection 5,000 Units (5,000 Units Subcutaneous Given 06/21/22 2119)  atorvastatin (LIPITOR) tablet 40 mg (has no administration in time range)  losartan (COZAAR) tablet 100 mg (has no administration in time range)  aspirin chewable tablet 324 mg (324 mg Oral Given 06/21/22 2118)    Or  aspirin suppository 300 mg ( Rectal See Alternative 06/21/22 2118)    ED Course/ Medical Decision Making/ A&P Clinical Course as of 06/21/22 2127  Sat Jun 21, 2022  1616 D-dimer negative, initial troponin negative. Will have repeat trop. Mag pending. [VK]  1802 Repeat troponin is negative. Due to concerning chest pain with cardiac risk factors I'll plan to consult cardiology for disposition recommendations. His chest pain has improved to 2/10 from 6/10 and diaphoresis resolved. He did report dyspnea on exertion going to the bathroom. [VK]  [id number] I spoke with Dr. Jayme Cloud of cardiology who will evaluate the patient at bedside.  [VK]  2059 Patient evaluated by cardiology at bedside who recommended admission for high risk chest pain. [VK]    Clinical Course User Index [VK] Rexford Maus, DO                             Medical Decision Making This patient presents to the ED with chief complaint(s) of chest pain with  pertinent past medical history of COPD, HTN, HLD which further complicates the presenting complaint. The complaint involves an extensive differential diagnosis and also carries with it a high risk of complications and morbidity.    The differential diagnosis includes ACS, arrhythmia, anemia, pneumonia, pneumothorax, pulmonary edema, pleural effusion, electrolyte abnormality, gastritis, GERD  Additional history obtained: Additional history obtained from spouse Records reviewed Primary Care Documents and outpatient cardiology records  ED Course and Reassessment: On patient's arrival to the emergency department he was initially mildly tachycardic, otherwise hemodynamically stable.  He is diaphoretic but otherwise well-appearing in no acute distress.  EKG on arrival showed normal sinus rhythm without acute ischemic changes.  Patient had labs ordered by triage including troponin and will additionally have a D-dimer and magnesium with his palpitations and chest pressure in the setting of tachycardia.  Chest x-ray showed no acute abnormalities.  He declined any pain medication at this time.  Independent labs interpretation:  The following labs were independently interpreted: mild leukocytosis, otherwise within normal range  Independent visualization of imaging: - I independently visualized the following imaging with scope of interpretation limited to determining acute life threatening conditions related to emergency care: CXR, which revealed no acute disease  Consultation: - Consulted or discussed management/test interpretation w/ external professional: cardiology  Consideration for admission or further workup: patient requires admission for high risk CP Social Determinants of health: N/A    Amount and/or Complexity of Data Reviewed Labs: ordered. Radiology: ordered.  Risk Decision regarding hospitalization.          Final Clinical Impression(s) / ED Diagnoses Final diagnoses:   Nonspecific chest pain    Rx / DC Orders ED Discharge Orders     None         Rexford Maus, DO 06/21/22 2127

## 2022-06-21 NOTE — ED Triage Notes (Addendum)
Pt here with reports of L sided chest pressure onset approx 2 hours pta along with feeling like his heart is racing and endorses feeling lightheaded. Took 2 aspirin prior to leaving to come to the ED.

## 2022-06-22 ENCOUNTER — Encounter: Payer: Self-pay | Admitting: Physician Assistant

## 2022-06-22 ENCOUNTER — Other Ambulatory Visit: Payer: Self-pay | Admitting: Physician Assistant

## 2022-06-22 ENCOUNTER — Telehealth: Payer: Self-pay | Admitting: Physician Assistant

## 2022-06-22 ENCOUNTER — Observation Stay (HOSPITAL_BASED_OUTPATIENT_CLINIC_OR_DEPARTMENT_OTHER): Payer: Commercial Managed Care - PPO

## 2022-06-22 DIAGNOSIS — I5030 Unspecified diastolic (congestive) heart failure: Secondary | ICD-10-CM | POA: Diagnosis not present

## 2022-06-22 DIAGNOSIS — R7989 Other specified abnormal findings of blood chemistry: Secondary | ICD-10-CM

## 2022-06-22 DIAGNOSIS — Z7982 Long term (current) use of aspirin: Secondary | ICD-10-CM | POA: Diagnosis not present

## 2022-06-22 DIAGNOSIS — Z79899 Other long term (current) drug therapy: Secondary | ICD-10-CM | POA: Diagnosis not present

## 2022-06-22 DIAGNOSIS — I200 Unstable angina: Secondary | ICD-10-CM | POA: Diagnosis not present

## 2022-06-22 DIAGNOSIS — J449 Chronic obstructive pulmonary disease, unspecified: Secondary | ICD-10-CM | POA: Diagnosis not present

## 2022-06-22 DIAGNOSIS — R079 Chest pain, unspecified: Secondary | ICD-10-CM

## 2022-06-22 DIAGNOSIS — R072 Precordial pain: Secondary | ICD-10-CM

## 2022-06-22 LAB — TSH: TSH: 3.297 u[IU]/mL (ref 0.350–4.500)

## 2022-06-22 LAB — BASIC METABOLIC PANEL
Anion gap: 12 (ref 5–15)
BUN: 21 mg/dL (ref 8–23)
CO2: 25 mmol/L (ref 22–32)
Calcium: 8.9 mg/dL (ref 8.9–10.3)
Chloride: 100 mmol/L (ref 98–111)
Creatinine, Ser: 1.42 mg/dL — ABNORMAL HIGH (ref 0.61–1.24)
GFR, Estimated: 56 mL/min — ABNORMAL LOW (ref >60)
Glucose, Bld: 173 mg/dL — ABNORMAL HIGH (ref 70–99)
Potassium: 4.2 mmol/L (ref 3.5–5.1)
Sodium: 137 mmol/L (ref 135–145)

## 2022-06-22 LAB — BRAIN NATRIURETIC PEPTIDE: B Natriuretic Peptide: 10.2 pg/mL (ref 0.0–100.0)

## 2022-06-22 LAB — ECHOCARDIOGRAM COMPLETE BUBBLE STUDY
AR max vel: 2.95 cm2
AV Area VTI: 2.8 cm2
AV Area mean vel: 2.77 cm2
AV Mean grad: 3 mmHg
AV Peak grad: 5.5 mmHg
Ao pk vel: 1.17 m/s
Area-P 1/2: 3.3 cm2
S' Lateral: 2.3 cm

## 2022-06-22 LAB — LIPID PANEL
Cholesterol: 175 mg/dL (ref 0–200)
HDL: 51 mg/dL (ref >40)
LDL Cholesterol: 85 mg/dL (ref 0–99)
Total CHOL/HDL Ratio: 3.4 RATIO
Triglycerides: 195 mg/dL — ABNORMAL HIGH (ref <150)
VLDL: 39 mg/dL (ref 0–40)

## 2022-06-22 LAB — CBC
HCT: 43.7 % (ref 39.0–52.0)
Hemoglobin: 14.1 g/dL (ref 13.0–17.0)
MCH: 32.5 pg (ref 26.0–34.0)
MCHC: 32.3 g/dL (ref 30.0–36.0)
MCV: 100.7 fL — ABNORMAL HIGH (ref 80.0–100.0)
Platelets: 225 10*3/uL (ref 150–400)
RBC: 4.34 MIL/uL (ref 4.22–5.81)
RDW: 14.6 % (ref 11.5–15.5)
WBC: 11.6 10*3/uL — ABNORMAL HIGH (ref 4.0–10.5)
nRBC: 0 % (ref 0.0–0.2)

## 2022-06-22 LAB — T4, FREE: Free T4: 0.89 ng/dL (ref 0.61–1.12)

## 2022-06-22 LAB — TROPONIN I (HIGH SENSITIVITY): Troponin I (High Sensitivity): 8 ng/L (ref <18)

## 2022-06-22 MED ORDER — OMEPRAZOLE 40 MG PO CPDR
40.0000 mg | DELAYED_RELEASE_CAPSULE | Freq: Every day | ORAL | 0 refills | Status: DC
Start: 2022-06-22 — End: 2022-09-16

## 2022-06-22 MED ORDER — MECLIZINE HCL 25 MG PO TABS: 25.0000 mg | ORAL_TABLET | Freq: Two times a day (BID) | ORAL | Status: DC | PRN

## 2022-06-22 MED ORDER — LOSARTAN POTASSIUM 100 MG PO TABS
ORAL_TABLET | ORAL | 1 refills | Status: DC
Start: 2022-06-22 — End: 2022-10-01

## 2022-06-22 MED ORDER — NITROGLYCERIN 0.4 MG SL SUBL: 0.4000 mg | SUBLINGUAL_TABLET | SUBLINGUAL | 12 refills | Status: AC | PRN

## 2022-06-22 MED ORDER — FUROSEMIDE 20 MG PO TABS
20.0000 mg | ORAL_TABLET | Freq: Every day | ORAL | 1 refills | Status: DC | PRN
Start: 2022-06-22 — End: 2022-12-08

## 2022-06-22 NOTE — Telephone Encounter (Signed)
Pt discharged from hospital 06/22/2022  He has a f/u appt scheduled 6/5 with Tereso Newcomer. Please make TOC phone call. Tereso Newcomer, PA-C    06/22/2022 10:25 AM

## 2022-06-22 NOTE — Plan of Care (Signed)
  Problem: Elimination: Goal: Will not experience complications related to bowel motility Outcome: Completed/Met Goal: Will not experience complications related to urinary retention Outcome: Completed/Met   Problem: Pain Managment: Goal: General experience of comfort will improve Outcome: Completed/Met   Problem: Skin Integrity: Goal: Risk for impaired skin integrity will decrease Outcome: Completed/Met   Problem: Elimination: Goal: Will not experience complications related to bowel motility Outcome: Completed/Met Goal: Will not experience complications related to urinary retention Outcome: Completed/Met   Problem: Pain Managment: Goal: General experience of comfort will improve Outcome: Completed/Met   Problem: Skin Integrity: Goal: Risk for impaired skin integrity will decrease Outcome: Completed/Met

## 2022-06-22 NOTE — Discharge Instructions (Signed)
You have a lab appointment at [facility] [phone] [address], [address], [place], [place] [address]) on Wednesday 5/29 at 3:45 (you can come any time you want). I did ask your primary care provider to see if they can get it done at [facility]. If you do not hear from them, just come to [place] to get it done.  We have left a message with our office to schedule a stress test and an echocardiogram. Someone will call you.  You have an appointment with Tereso Newcomer, PA-C on Wednesday June 5 at 2:45 pm for follow up. This is at [facility] at [address], [place] [address]. This is also the office where Dr. Eden Emms is at.

## 2022-06-22 NOTE — Discharge Summary (Addendum)
Discharge Summary    Patient ID: David Ortega MRN: [id number]; DOB: Jul 13, 1960  Admit date: 06/21/2022 Discharge date: 06/22/2022  PCP:  Bennie Pierini, FNP   [facility] Providers Cardiologist:  Charlton Haws, MD        Discharge Diagnoses    Principal Problem:   Unstable angina (HCC) Active Problems:   Elevated serum creatinine   Hyperlipidemia   Hypertension   GERD (gastroesophageal reflux disease)  Diagnostic Studies/Procedures    None  _____________   History of Present Illness     David Ortega is a 62 y.o. male with a hx of OSA (does not wear CPAP), HTN, possible COPD, HLD, Fhx of CAD, HFpEF, active smoker and obesity who presented to [facility] on 06/21/2022 with sudden onset of substernal chest pain upon awakening associated with nausea and diaphoresis. He noted worsening shortness of breath over the past week as well as increasing lower extremity edema. His PCP increased his Furosemide. His EKG did not show any acute changes or ST elevation. His initial Troponins were negative. He was admitted for observation for further evaluation and management.   Hospital Course     Consultants: none    He was placed on [place] Heparin and ASA. His hs-Troponins remained neg (x4). His DDimer and BNP were both normal. CXR demonstrated no acute cardiopulmonary disease. He was evaluated by Dr. Eden Emms this morning. The patient is not having further chest pain. His f/u ECG is normal. Given his negative workup, he does not need to stay in the hospital 2 more days to get stress test/[facility] weekend). Therefore, he can be DC with OP workup and close f/u. His symptoms have some GI features. His Omeprazole will be continued. His creatinine did increase slightly. His Furosemide had been increased prior to admission and he was felt to be euvolemic on exam during admission. Will hold furosemide at discharge (can take as needed) and take 1/2 dose  Losartan x 1 day (then resume full dose). We will arrange a repeat BMET next week. He will be discharged on NTG prn chest pain. He will have OP Myocardial PET vs Lexiscan Myoview (whichever can be done first) and echocardiogram. He will need TOC f/u in 1-2 weeks. He is felt to be stable for discharge to home.     Did the patient have an acute coronary syndrome (MI, NSTEMI, STEMI, etc) this admission?:  No                               Did the patient have a percutaneous coronary intervention (stent / angioplasty)?:  No.    Shared Decision Making/Informed Consent The risks [chest pain, shortness of breath, cardiac arrhythmias, dizziness, blood pressure fluctuations, myocardial infarction, stroke/transient ischemic attack, nausea, vomiting, allergic reaction, radiation exposure, metallic taste sensation and life-threatening complications (estimated to be 1 in 10,000)], benefits (risk stratification, diagnosing coronary artery disease, treatment guidance) and alternatives of a nuclear stress test were discussed in detail with [name] and he agrees to proceed.  The risks [chest pain, shortness of breath, cardiac arrhythmias, dizziness, blood pressure fluctuations, myocardial infarction, stroke/transient ischemic attack, nausea, vomiting, allergic reaction, radiation exposure, metallic taste sensation and life-threatening complications (estimated to be 1 in 10,000)], benefits (risk stratification, diagnosing coronary artery disease, treatment guidance) and alternatives of a cardiac PET stress test were discussed in detail with [name] and he agrees to proceed.  The patient will be scheduled for a TOC follow up appointment on 6/5 at 2:45 pm.  A message has been sent to the [facility] and Scheduling Pool at the office where the patient should be seen for follow up.  _____________  Discharge Vitals Blood pressure (!) 141/84, pulse 72, temperature 98.5 F (36.9 C), temperature source Oral, resp. rate  19, height 5\' 11"  (1.803 m), weight (!) 136.9 kg, SpO2 97 %.  Filed Weights   06/21/22 2228 06/22/22 0335  Weight: (!) 137.2 kg (!) 136.9 kg    Labs & Radiologic Studies    CBC Recent Labs    06/21/22 2116 06/22/22 0108  WBC 12.3* 11.6*  HGB 14.5 14.1  HCT 44.3 43.7  MCV 98.2 100.7*  PLT 226 225   Basic Metabolic Panel Recent Labs    29/56/21 1414 06/21/22 1624 06/21/22 2116 06/22/22 0108  NA 137  --   --  137  K 4.0  --   --  4.2  CL 101  --   --  100  CO2 26  --   --  25  GLUCOSE 84  --   --  173*  BUN 19  --   --  21  CREATININE 1.21  --  1.28* 1.42*  CALCIUM 9.3  --   --  8.9  MG  --  1.9  --   --    High Sensitivity Troponin:   Recent Labs  Lab 06/21/22 1414 06/21/22 1624 06/21/22 2116 06/21/22 2317  TROPONINIHS 7 7 6 8     BNP Recent Labs    06/22/22 0108  BNP 10.2     D-Dimer Recent Labs    06/21/22 1525  DDIMER 0.36    Fasting Lipid Panel Recent Labs    06/22/22 0108  CHOL 175  HDL 51  LDLCALC 85  TRIG 195*  CHOLHDL 3.4   Thyroid Function Tests Recent Labs    06/22/22 0108  TSH 3.297   _____________  DG Chest 2 View  Result Date: 06/21/2022 CLINICAL DATA:  Central chest pressure with shortness of breath. EXAM: CHEST - 2 VIEW COMPARISON:  06/19/2018 for FINDINGS: Cardiac silhouette and mediastinal contours within normal limits. Mild calcification within the aortic arch. Mild lateral left mid lung curvilinear scarring is unchanged. No acute airspace opacity. Mild biapical pleural thickening/scarring is unchanged. No pleural effusion or thorax. Moderate multilevel degenerative changes of the thoracic spine. IMPRESSION: 1. No active cardiopulmonary disease. 2. Mild left mid lung scarring, unchanged. Electronically Signed   By: Neita Garnet M.D.   On: 06/21/2022 14:59   DG Chest 2 View  Result Date: 06/21/2022 CLINICAL DATA:  Dyspnea on exertion. EXAM: CHEST - 2 VIEW COMPARISON:  Chest radiographs 02/20/2017 FINDINGS: Cardiac  silhouette and mediastinal contours are within limits. Mild left lateral midlung, near scarring is unchanged. Mild biapical pleural thickening/scarring is unchanged. No acute airspace opacity. No pleural effusion or pneumothorax. Moderate multilevel degenerative disc changes of the thoracic spine. IMPRESSION: 1. No active cardiopulmonary disease. 2. Mild left lateral midlung scarring, unchanged. Electronically Signed   By: Neita Garnet M.D.   On: 06/21/2022 14:58   Disposition   Pt is being discharged home today in good condition.  Follow-up Plans & Appointments     Discharge Instructions     (HEART FAILURE PATIENTS) Call MD:  Anytime you have any of the following symptoms: 1) 3 pound weight gain in 24 hours or 5 pounds in 1 week 2) shortness of breath, with  or without a dry hacking cough 3) swelling in the hands, feet or stomach 4) if you have to sleep on extra pillows at night in order to breathe.   Complete by: As directed    Diet - low sodium heart healthy   Complete by: As directed    Driving Restrictions   Complete by: As directed    No commercial driving until stress test results are known.   Increase activity slowly   Complete by: As directed    Lifting restrictions   Complete by: As directed    No lifting over 20 lbs for now   Other Restrictions   Complete by: As directed    No heavy exertion or strenuous activity until your stress test is done and you have been seen in follow up.        Discharge Medications   Allergies as of 06/22/2022       Reactions   Propranolol Shortness Of Breath   Lisinopril Cough   Simvastatin    Leg pain        Medication List     TAKE these medications    albuterol 108 (90 Base) MCG/ACT inhaler Commonly known as: VENTOLIN HFA INHALE 2 PUFFS BY MOUTH EVERY 6 HOURS AS NEEDED FOR WHEEZING AND SHORTNESS OF BREATH . APPOINTMENT REQUIRED FOR FUTURE REFILLS   aspirin EC 81 MG tablet Take 81 mg by mouth daily. Swallow whole.    atorvastatin 40 MG tablet Commonly known as: LIPITOR TAKE 1 TABLET BY MOUTH ONCE DAILY -NEED  APPT  FOR  MORE  REFILLS- What changed:  how much to take how to take this when to take this   budesonide-formoterol 160-4.5 MCG/ACT inhaler Commonly known as: Symbicort Inhale 2 puffs into the lungs 2 (two) times daily.   buPROPion 300 MG 24 hr tablet Commonly known as: Wellbutrin XL Take 1 tablet (300 mg total) by mouth daily.   busPIRone 15 MG tablet Commonly known as: BUSPAR Take 1 tablet (15 mg total) by mouth 2 (two) times daily.   escitalopram 20 MG tablet Commonly known as: LEXAPRO Take 1 tablet (20 mg total) by mouth daily.   ezetimibe 10 MG tablet Commonly known as: ZETIA Take 10 mg by mouth at bedtime.   furosemide 20 MG tablet Commonly known as: LASIX Take 1 tablet (20 mg total) by mouth daily as needed for edema. What changed:  when to take this reasons to take this Another medication with the same name was removed. Continue taking this medication, and follow the directions you see here.   losartan 100 MG tablet Commonly known as: COZAAR Take 1/2 tab (50 mg) on Monday 5/27. On 5/28, resume 1 tab (100 mg). What changed:  how much to take how to take this when to take this additional instructions   meclizine 25 MG tablet Commonly known as: ANTIVERT Take 1 tablet (25 mg total) by mouth 2 (two) times daily as needed for dizziness.   meloxicam 15 MG tablet Commonly known as: MOBIC Take 1 tablet (15 mg total) by mouth daily.   mupirocin ointment 2 % Commonly known as: BACTROBAN Apply 1 application. topically 2 (two) times daily.   nitroGLYCERIN 0.4 MG SL tablet Commonly known as: NITROSTAT Place 1 tablet (0.4 mg total) under the tongue every 5 (five) minutes x 3 doses as needed for chest pain.   omeprazole 40 MG capsule Commonly known as: PRILOSEC Take 1 capsule (40 mg total) by mouth daily. What changed:  how  much to take how to take this when to  take this additional instructions   triamcinolone cream 0.1 % Commonly known as: KENALOG APPLY ONE APPLICATION TWICE DAILY What changed: See the new instructions.        Outstanding Labs/Studies   BMET 5/29 Myoview or PET Echocardiogram   Duration of Discharge Encounter   Greater than 30 minutes including physician time.  Signed, Tereso Newcomer, PA-C 06/22/2022, 10:35 AM

## 2022-06-22 NOTE — Progress Notes (Signed)
Subjective:  Denies SSCP, palpitations or Dyspnea Discussed lack of ability to do stress testing or cardiac CTA On holiday weekend   Objective:  Vitals:   06/21/22 2228 06/22/22 0200 06/22/22 0335 06/22/22 0743  BP: 135/62 100/73 135/80 (!) 141/84  Pulse: 77 75 76 72  Resp: 18 18 18 19   Temp: 98.2 F (36.8 C) 98.2 F (36.8 C) 97.8 F (36.6 C) 98.5 F (36.9 C)  TempSrc: Oral Oral Oral Oral  SpO2: 96% 97% 97% 97%  Weight: (!) 137.2 kg  (!) 136.9 kg   Height: 5\' 11"  (1.803 m)       Intake/Output from previous day:  Intake/Output Summary (Last 24 hours) at 06/22/2022 0825 Last data filed at 06/21/2022 2200 Gross per 24 hour  Intake --  Output 226 ml  Net -226 ml    Physical Exam: Affect appropriate Healthy:  appears stated age HEENT: normal Neck supple with no adenopathy JVP normal no bruits no thyromegaly Lungs clear with no wheezing and good diaphragmatic motion Heart:  S1/S2 no murmur, no rub, gallop or click PMI normal Abdomen: benighn, BS positve, no tenderness, no AAA no bruit.  No HSM or HJR Distal pulses intact with no bruits No edema Neuro non-focal Skin warm and dry No muscular weakness   Lab Results: Basic Metabolic Panel: Recent Labs    06/21/22 1414 06/21/22 1624 06/21/22 2116 06/22/22 0108  NA 137  --   --  137  K 4.0  --   --  4.2  CL 101  --   --  100  CO2 26  --   --  25  GLUCOSE 84  --   --  173*  BUN 19  --   --  21  CREATININE 1.21  --  1.28* 1.42*  CALCIUM 9.3  --   --  8.9  MG  --  1.9  --   --    Liver Function Tests: Recent Labs    06/19/22 0940  AST 22  ALT 28  ALKPHOS 134*  BILITOT 1.0  PROT 6.5  ALBUMIN 3.9   No results for input(s): "LIPASE", "AMYLASE" in the last 72 hours. CBC: Recent Labs    06/19/22 0940 06/21/22 1414 06/21/22 2116 06/22/22 0108  WBC 9.5   < > 12.3* 11.6*  NEUTROABS 7.0  --   --   --   HGB 13.6   < > 14.5 14.1  HCT 39.9   < > 44.3 43.7  MCV 98*   < > 98.2 100.7*  PLT 215   < >  226 225   < > = values in this interval not displayed.   Cardiac Enzymes: Recent Labs    06/19/22 0928  CKTOTAL 78  CKMB 0   BNP: Invalid input(s): "POCBNP" D-Dimer: Recent Labs    06/21/22 1525  DDIMER 0.36   Hemoglobin A1C: No results for input(s): "HGBA1C" in the last 72 hours. Fasting Lipid Panel: Recent Labs    06/22/22 0108  CHOL 175  HDL 51  LDLCALC 85  TRIG 195*  CHOLHDL 3.4   Thyroid Function Tests: Recent Labs    06/22/22 0108  TSH 3.297    Imaging: DG Chest 2 View  Result Date: 06/21/2022 CLINICAL DATA:  Central chest pressure with shortness of breath. EXAM: CHEST - 2 VIEW COMPARISON:  06/19/2018 for FINDINGS: Cardiac silhouette and mediastinal contours within normal limits. Mild calcification within the aortic arch. Mild lateral left mid lung curvilinear scarring is unchanged.  No acute airspace opacity. Mild biapical pleural thickening/scarring is unchanged. No pleural effusion or thorax. Moderate multilevel degenerative changes of the thoracic spine. IMPRESSION: 1. No active cardiopulmonary disease. 2. Mild left mid lung scarring, unchanged. Electronically Signed   By: Neita Garnet M.D.   On: 06/21/2022 14:59    Cardiac Studies:  ECG: NSR normal    Telemetry:  NSR   Echo:   Medications:    aspirin EC  81 mg Oral Daily   atorvastatin  40 mg Oral Daily   heparin  5,000 Units Subcutaneous Q8H   losartan  100 mg Oral Daily      Assessment/Plan:   Chest pain:  r/o negative troponin normal ECG Pain with some GI overtones No need to keep in hospital until [place] D/c home with SL nitro Arrange PET/CT as outpatient in next week or 2.  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

## 2022-06-23 ENCOUNTER — Telehealth: Payer: Self-pay

## 2022-06-23 NOTE — Transitions of Care (Post Inpatient/ED Visit) (Signed)
06/23/2022  Name: David Ortega MRN: [id number] DOB: 1960-07-12  Today's TOC FU Call Status: Today's TOC FU Call Status:: Successful TOC FU Call Competed TOC FU Call Complete Date: 06/23/22  Transition Care Management Follow-up Telephone Call Date of Discharge: 06/22/22 Discharge Facility: [facility]) Type of Discharge: Inpatient Admission Primary Inpatient Discharge Diagnosis:: chest pain How have you been since you were released from the hospital?: Better  Items Reviewed: Did you receive and understand the discharge instructions provided?: No Medications obtained,verified, and reconciled?: Yes (Medications Reviewed) Any new allergies since your discharge?: Yes Dietary orders reviewed?: Yes Do you have support at home?: Yes People in Home: spouse  Medications Reviewed Today: Medications Reviewed Today     Reviewed by Karena Addison, LPN (Licensed Practical Nurse) on 06/23/22 at 1445  Med List Status: <None>   Medication Order Taking? Sig Documenting Provider Last Dose Status Informant  albuterol (VENTOLIN HFA) 108 (90 Base) MCG/ACT inhaler [id number] Yes INHALE 2 PUFFS BY MOUTH EVERY 6 HOURS AS NEEDED FOR WHEEZING AND SHORTNESS OF BREATH . APPOINTMENT REQUIRED FOR FUTURE REFILLS Bennie Pierini, FNP Taking Active Spouse/Significant Other  aspirin EC 81 MG tablet [id number] Yes Take 81 mg by mouth daily. Swallow whole. [provider] Taking Active Spouse/Significant Other  atorvastatin (LIPITOR) 40 MG tablet [id number] Yes TAKE 1 TABLET BY MOUTH ONCE DAILY -NEED  APPT  FOR  MORE  REFILLS-  Patient taking differently: Take 40 mg by mouth daily. TAKE 1 TABLET BY MOUTH ONCE DAILY -NEED  APPT  FOR  MORE  REFILLS-   Daphine Deutscher, Mary-Margaret, FNP Taking Active Spouse/Significant Other  budesonide-formoterol (SYMBICORT) 160-4.5 MCG/ACT inhaler [id number] Yes Inhale 2 puffs into the lungs 2 (two) times daily. Bennie Pierini, FNP Taking Active Spouse/Significant  Other  buPROPion (WELLBUTRIN XL) 300 MG 24 hr tablet [id number] Yes Take 1 tablet (300 mg total) by mouth daily. Bennie Pierini, FNP Taking Active Spouse/Significant Other  busPIRone (BUSPAR) 15 MG tablet [id number] Yes Take 1 tablet (15 mg total) by mouth 2 (two) times daily. Bennie Pierini, FNP Taking Active Spouse/Significant Other  escitalopram (LEXAPRO) 20 MG tablet [id number] Yes Take 1 tablet (20 mg total) by mouth daily. Bennie Pierini, FNP Taking Active Spouse/Significant Other  ezetimibe (ZETIA) 10 MG tablet [id number] Yes Take 10 mg by mouth at bedtime. [provider] Taking Active Spouse/Significant Other  furosemide (LASIX) 20 MG tablet [id number] Yes Take 1 tablet (20 mg total) by mouth daily as needed for edema. Tereso Newcomer T, PA-C Taking Active   losartan (COZAAR) 100 MG tablet [id number] Yes Take 1/2 tab (50 mg) on Monday 5/27. On 5/28, resume 1 tab (100 mg). Tereso Newcomer T, PA-C Taking Active   meclizine (ANTIVERT) 25 MG tablet [id number] Yes Take 1 tablet (25 mg total) by mouth 2 (two) times daily as needed for dizziness. Tereso Newcomer T, PA-C Taking Active   meloxicam (MOBIC) 15 MG tablet [id number] Yes Take 1 tablet (15 mg total) by mouth daily. Bennie Pierini, FNP Taking Active Spouse/Significant Other  mupirocin ointment (BACTROBAN) 2 % [id number] Yes Apply 1 application. topically 2 (two) times daily. Bennie Pierini, FNP Taking Active Spouse/Significant Other  nitroGLYCERIN (NITROSTAT) 0.4 MG SL tablet [id number] Yes Place 1 tablet (0.4 mg total) under the tongue every 5 (five) minutes x 3 doses as needed for chest pain. Tereso Newcomer T, PA-C Taking Active   omeprazole (PRILOSEC) 40 MG capsule [id number] Yes Take 1 capsule (40 mg total) by mouth daily. Beatrice Lecher, PA-C Taking Active  triamcinolone cream (KENALOG) 0.1 % [id number] Yes APPLY ONE APPLICATION TWICE DAILY  Patient taking differently: Apply 1 Application topically 2 (two) times  daily.   Bennie Pierini, FNP Taking Active Self, Spouse/Significant Other            Home Care and Equipment/Supplies: Were Home Health Services Ordered?: NA Any new equipment or medical supplies ordered?: NA  Functional Questionnaire: Do you need assistance with bathing/showering or dressing?: No Do you need assistance with meal preparation?: No Do you need assistance with eating?: No Do you have difficulty maintaining continence: No Do you need assistance with getting out of bed/getting out of a chair/moving?: No Do you have difficulty managing or taking your medications?: No  Follow up appointments reviewed: PCP Follow-up appointment confirmed?: Yes Date of PCP follow-up appointment?: 06/26/22 Follow-up Provider: [facility] Follow-up appointment confirmed?: NA Do you need transportation to your follow-up appointment?: No Do you understand care options if your condition(s) worsen?: Yes-patient verbalized understanding    SIGNATURE Karena Addison, LPN [facility] Nurse Health Advisor Direct Dial [phone]

## 2022-06-24 ENCOUNTER — Telehealth: Payer: Self-pay

## 2022-06-24 ENCOUNTER — Telehealth (HOSPITAL_COMMUNITY): Payer: Self-pay | Admitting: *Deleted

## 2022-06-24 LAB — HEMOGLOBIN A1C
Hgb A1c MFr Bld: 6.7 % — ABNORMAL HIGH (ref 4.8–5.6)
Mean Plasma Glucose: 146 mg/dL

## 2022-06-24 NOTE — Telephone Encounter (Signed)
Patient contacted regarding discharge from [facility]  on 06/22/2022.  Patient understands to follow up with provider Scott Weaver, PA on 07/02/2022 at 1425 at [address]. Patient understands discharge instructions? yes  Patient understands medications and regiment? yes  Patient understands to bring all medications to this visit? yes  Ask patient:  Are you enrolled in My Chart yes  If no ask patient if they would like to enroll.     

## 2022-06-24 NOTE — Telephone Encounter (Signed)
Reaching out to patient to offer assistance regarding upcoming cardiac imaging study; pt verbalizes understanding of appt date/time, parking situation and where to check in, pre-test NPO status and verified current allergies; name and call back number provided for further questions should they arise  Tavi Hoogendoorn RN Navigator Cardiac Imaging [facility] [phone] office [phone] cell  Patient aware to avoid caffeine 12 hours prior to his cardiac PET scan. 

## 2022-06-24 NOTE — Telephone Encounter (Signed)
Patient contacted regarding discharge from [facility]  on 06/22/2022.  Patient understands to follow up with provider Tereso Newcomer, PA on 07/02/2022 at 1425 at [address]. Patient understands discharge instructions? yes  Patient understands medications and regiment? yes  Patient understands to bring all medications to this visit? yes  Ask patient:  Are you enrolled in My Chart yes  If no ask patient if they would like to enroll.

## 2022-06-25 ENCOUNTER — Ambulatory Visit (HOSPITAL_COMMUNITY): Admission: RE | Admit: 2022-06-25 | Payer: Commercial Managed Care - PPO | Source: Ambulatory Visit

## 2022-06-25 ENCOUNTER — Ambulatory Visit: Payer: Commercial Managed Care - PPO | Attending: Nurse Practitioner

## 2022-06-26 ENCOUNTER — Other Ambulatory Visit: Payer: Self-pay | Admitting: *Deleted

## 2022-06-26 ENCOUNTER — Telehealth (HOSPITAL_COMMUNITY): Payer: Self-pay

## 2022-06-26 ENCOUNTER — Ambulatory Visit: Payer: Commercial Managed Care - PPO | Admitting: Family Medicine

## 2022-06-26 ENCOUNTER — Telehealth: Payer: Self-pay | Admitting: *Deleted

## 2022-06-26 NOTE — Telephone Encounter (Signed)
Spoke with the patient, detailed instructions given. He stated that he would be here for his test. Asked to call back with any questions. David Ortega EMTP/CCT 

## 2022-06-26 NOTE — Telephone Encounter (Signed)
Call placed to pt regarding Lexiscan that has been scheduled for him.  Left pt a message to call back.

## 2022-06-26 NOTE — Telephone Encounter (Signed)
Patient returned RMA's call. 

## 2022-06-26 NOTE — Telephone Encounter (Signed)
Returned call to pt.  He is aware of his stress test 6/4 & 6/5.

## 2022-06-27 LAB — LIPOPROTEIN A (LPA): Lipoprotein (a): 52.3 nmol/L — ABNORMAL HIGH (ref <75.0)

## 2022-07-01 ENCOUNTER — Ambulatory Visit (HOSPITAL_COMMUNITY): Payer: Commercial Managed Care - PPO

## 2022-07-01 ENCOUNTER — Encounter (HOSPITAL_COMMUNITY): Payer: Self-pay

## 2022-07-02 ENCOUNTER — Ambulatory Visit (HOSPITAL_COMMUNITY): Payer: Commercial Managed Care - PPO

## 2022-07-02 ENCOUNTER — Telehealth (HOSPITAL_COMMUNITY): Payer: Self-pay | Admitting: *Deleted

## 2022-07-02 ENCOUNTER — Other Ambulatory Visit (HOSPITAL_COMMUNITY): Payer: Commercial Managed Care - PPO

## 2022-07-02 ENCOUNTER — Ambulatory Visit: Payer: Commercial Managed Care - PPO | Admitting: Physician Assistant

## 2022-07-02 DIAGNOSIS — R072 Precordial pain: Secondary | ICD-10-CM

## 2022-07-02 HISTORY — DX: Precordial pain: R07.2

## 2022-07-02 NOTE — Telephone Encounter (Signed)
Patient given detailed instructions per Myocardial Perfusion Study Information Sheet for the test on 07/07/22 at 8:15. Patient notified to arrive 15 minutes early and that it is imperative to arrive on time for appointment to keep from having the test rescheduled.  If you need to cancel or reschedule your appointment, please call the office within 24 hours of your appointment. . Patient verbalized understanding.Daneil Dolin

## 2022-07-02 NOTE — Progress Notes (Deleted)
  Cardiology Office Note:    Date:  07/02/2022  ID:  Sula Soda, DOB 1960-09-15, MRN [id number] PCP: Bennie Pierini, FNP  [facility] Providers Cardiologist:  Charlton Haws, MD { Click to update primary MD,subspecialty MD or APP then REFRESH:1}    {Click to Open Review  :1}   Patient Profile:      (HFpEF) heart failure with preserved ejection fraction  TTE 06/22/2022: EF 60-65, no RWMA, mild concentric LVH, GR 1 DD, normal RVSF, RAP 3, bubble study negative Hypertension  Hyperlipidemia  OSA (does not wear CPAP) Possible Chronic Obstructive Pulmonary Disease  FHx CAD  Tobacco use Obesity         History of Present Illness:   AMY USHER is a 62 y.o. male who returns for post hospital f/u. He was admitted 5/25-5/26 with worsening dyspnea on exertion, leg edema and sudden onset chest pain w assoc nausea and diaphoresis. His hsTroponins were neg x 4. His Lasix was increased prior to admit. His SCr was increased and his Lasix was changed back to prn. PPI was continued for possible GI etiology. Echocardiogram demonstrated normal EF, mild diastolic dysfunction. OP stress testing was arranged. However, this was canceled Renaldo Harrison pending"). ***  ROS ***    Studies Reviewed:    EKG:  ***  *** Risk Assessment/Calculations:   {Does this patient have ATRIAL FIBRILLATION?:[id number]} No BP recorded.  {Refresh Note OR Click here to enter BP  :1}***       Physical Exam:   VS:  There were no vitals taken for this visit.   Wt Readings from Last 3 Encounters:  06/22/22 (!) 301 lb 12.8 oz (136.9 kg)  06/19/22 (!) 308 lb 9.6 oz (140 kg)  05/15/22 (!) 310 lb (140.6 kg)    Physical Exam***    ASSESSMENT AND PLAN:   No problem-specific Assessment & Plan notes found for this encounter. ***    {Are you ordering a CV Procedure (e.g. stress test, cath, DCCV, TEE, etc)?   Press F2        :[id number]}  Dispo:  No follow-ups on file.  Signed, Tereso Newcomer, PA-C

## 2022-07-03 ENCOUNTER — Ambulatory Visit (HOSPITAL_COMMUNITY): Payer: Commercial Managed Care - PPO

## 2022-07-04 ENCOUNTER — Ambulatory Visit (HOSPITAL_COMMUNITY): Payer: Commercial Managed Care - PPO | Attending: Cardiovascular Disease

## 2022-07-04 DIAGNOSIS — R072 Precordial pain: Secondary | ICD-10-CM | POA: Insufficient documentation

## 2022-07-04 LAB — MYOCARDIAL PERFUSION IMAGING: Rest Nuclear Isotope Dose: 32.3 mCi

## 2022-07-04 MED ORDER — TECHNETIUM TC 99M TETROFOSMIN IV KIT
32.3000 | PACK | Freq: Once | INTRAVENOUS | Status: AC | PRN
  Administered 2022-07-04: 32.3 via INTRAVENOUS

## 2022-07-07 ENCOUNTER — Ambulatory Visit: Payer: Commercial Managed Care - PPO | Attending: Physician Assistant

## 2022-07-07 ENCOUNTER — Encounter: Payer: Self-pay | Admitting: Physician Assistant

## 2022-07-07 LAB — MYOCARDIAL PERFUSION IMAGING
Base ST Depression (mm): 0 mm
LV dias vol: 93 mL (ref 62–150)
LV sys vol: 42 mL
Nuc Stress EF: 55 %
Peak HR: 100 {beats}/min
Rest HR: 80 {beats}/min
SDS: 2
SRS: 0
SSS: 2
ST Depression (mm): 0 mm
Stress Nuclear Isotope Dose: 32.1 mCi
TID: 0.87

## 2022-07-07 MED ORDER — TECHNETIUM TC 99M TETROFOSMIN IV KIT
32.1000 | PACK | Freq: Once | INTRAVENOUS | Status: AC | PRN
  Administered 2022-07-07: 32.1 via INTRAVENOUS

## 2022-07-07 MED ORDER — REGADENOSON 0.4 MG/5ML IV SOLN
0.4000 mg | Freq: Once | INTRAVENOUS | Status: AC
Start: 2022-07-07 — End: 2022-07-07
  Administered 2022-07-07: 0.4 mg via INTRAVENOUS

## 2022-07-07 NOTE — Progress Notes (Signed)
Office Visit    Patient Name: David Ortega Date of Encounter: 07/07/2022  Primary Care Provider:  Bennie Pierini, FNP Primary Cardiologist:  Charlton Haws, MD Primary Electrophysiologist: None   Past Medical History    Past Medical History:  Diagnosis Date   Asthma    COPD (chronic obstructive pulmonary disease) (HCC)    Dyslipidemia    GERD (gastroesophageal reflux disease)    Hyperlipidemia    Hypertension    Kidney stones 11/2013   Past Surgical History:  Procedure Laterality Date   BACK SURGERY     CHOLECYSTECTOMY     COLONOSCOPY N/A 09/05/2016   Procedure: COLONOSCOPY;  Surgeon: Corbin Ade, MD;  Location: [facility];  Service: Endoscopy;  Laterality: N/A;  2:15 PM   ESOPHAGOGASTRODUODENOSCOPY     LEFT ANKLE SURGERY      Allergies  Allergies  Allergen Reactions   Propranolol Shortness Of Breath   Lisinopril Cough   Simvastatin     Leg pain     History of Present Illness    David Ortega  is a 62 year old male with a PMH of HTN, HLD, COPD, OSA (on CPAP), HFpEF, family history of CAD, active smoker, obesity who presents today for post hospital follow-up of chest pain.  David Ortega was seen initially on 06/22/2022 for complaint of chest pain.  He endorsed chest pain in addition to palpitations and diaphoresis.  He described chest pain as retrosternal and nonradiating.  He also endorsed lower extremity edema at his PCP office.  D-dimer and troponins were done x 2 that were negative.  He was discharged home with plan to complete ambulatory stress test.  He underwent a Lexiscan Myoview on 07/04/2022 that was normal.  David Ortega presents today with his wife for post ED follow-up.  Since last being seen in the office patient reports that he is doing better and has not experienced any repeat chest pressure or discomfort since his ED visit.  His blood pressure was initially elevated today at 142/60 and was 138/88 on recheck.  His heart rate was 78 bpm  and patient reports compliance with his medications.  He is slightly volume up on exam today and has trace lower extremity edema present.  He reports compliance with his Lasix and additional medications without any adverse reactions.  During today's visit we reviewed his Myoview results and patient had all questions answered to his satisfaction.  We also discussed in detail primary prevention measures such as lifestyle adjustment to decrease his cardiovascular risk factors.  Patient denies chest pain, palpitations, dyspnea, PND, orthopnea, nausea, vomiting, dizziness, syncope, edema, weight gain, or early satiety.  Home Medications    Current Outpatient Medications  Medication Sig Dispense Refill   albuterol (VENTOLIN HFA) 108 (90 Base) MCG/ACT inhaler INHALE 2 PUFFS BY MOUTH EVERY 6 HOURS AS NEEDED FOR WHEEZING AND SHORTNESS OF BREATH . APPOINTMENT REQUIRED FOR FUTURE REFILLS 9 g 4   aspirin EC 81 MG tablet Take 81 mg by mouth daily. Swallow whole.     atorvastatin (LIPITOR) 40 MG tablet TAKE 1 TABLET BY MOUTH ONCE DAILY -NEED  APPT  FOR  MORE  REFILLS- (Patient taking differently: Take 40 mg by mouth daily. TAKE 1 TABLET BY MOUTH ONCE DAILY -NEED  APPT  FOR  MORE  REFILLS-) 90 tablet 1   budesonide-formoterol (SYMBICORT) 160-4.5 MCG/ACT inhaler Inhale 2 puffs into the lungs 2 (two) times daily. 33 g 3   buPROPion (WELLBUTRIN XL) 300 MG  24 hr tablet Take 1 tablet (300 mg total) by mouth daily. 90 tablet 1   busPIRone (BUSPAR) 15 MG tablet Take 1 tablet (15 mg total) by mouth 2 (two) times daily. 60 tablet 2   escitalopram (LEXAPRO) 20 MG tablet Take 1 tablet (20 mg total) by mouth daily. 90 tablet 1   ezetimibe (ZETIA) 10 MG tablet Take 10 mg by mouth at bedtime.     furosemide (LASIX) 20 MG tablet Take 1 tablet (20 mg total) by mouth daily as needed for edema. 90 tablet 1   losartan (COZAAR) 100 MG tablet Take 1/2 tab (50 mg) on Monday 5/27. On 5/28, resume 1 tab (100 mg). 90 tablet 1   meclizine  (ANTIVERT) 25 MG tablet Take 1 tablet (25 mg total) by mouth 2 (two) times daily as needed for dizziness.     meloxicam (MOBIC) 15 MG tablet Take 1 tablet (15 mg total) by mouth daily. 30 tablet 2   mupirocin ointment (BACTROBAN) 2 % Apply 1 application. topically 2 (two) times daily. 22 g 0   nitroGLYCERIN (NITROSTAT) 0.4 MG SL tablet Place 1 tablet (0.4 mg total) under the tongue every 5 (five) minutes x 3 doses as needed for chest pain. 25 tablet 12   omeprazole (PRILOSEC) 40 MG capsule Take 1 capsule (40 mg total) by mouth daily. 30 capsule 0   triamcinolone cream (KENALOG) 0.1 % APPLY ONE APPLICATION TWICE DAILY (Patient taking differently: Apply 1 Application topically 2 (two) times daily.) 453.6 g 1   No current facility-administered medications for this visit.     Review of Systems  Please see the history of present illness.    (+) Trace lower extremity edema  All other systems reviewed and are otherwise negative except as noted above.  Physical Exam    Wt Readings from Last 3 Encounters:  06/22/22 (!) 301 lb 12.8 oz (136.9 kg)  06/19/22 (!) 308 lb 9.6 oz (140 kg)  05/15/22 (!) 310 lb (140.6 kg)   UE:AVWUJ were no vitals filed for this visit.,There is no height or weight on file to calculate BMI.  Constitutional:      Appearance: Healthy appearance. Not in distress.  Neck:     Vascular: JVD normal.  Pulmonary:     Effort: Pulmonary effort is normal.     Breath sounds: No wheezing. No rales. Diminished in the bases Cardiovascular:     Normal rate. Regular rhythm. Normal S1. Normal S2.      Murmurs: There is no murmur.  Edema:    Trace lower extremity edema Abdominal:     Palpations: Abdomen is soft non tender. There is no hepatomegaly.  Skin:    General: Skin is warm and dry.  Neurological:     General: No focal deficit present.     Mental Status: Alert and oriented to person, place and time.     Cranial Nerves: Cranial nerves are intact.  EKG/LABS/ Recent Cardiac  Studies    ECG personally reviewed by me today -none completed today    Lab Results  Component Value Date   WBC 11.6 (H) 06/22/2022   HGB 14.1 06/22/2022   HCT 43.7 06/22/2022   MCV 100.7 (H) 06/22/2022   PLT 225 06/22/2022   Lab Results  Component Value Date   CREATININE 1.42 (H) 06/22/2022   BUN 21 06/22/2022   NA 137 06/22/2022   K 4.2 06/22/2022   CL 100 06/22/2022   CO2 25 06/22/2022   Lab Results  Component Value Date   ALT 28 06/19/2022   AST 22 06/19/2022   ALKPHOS 134 (H) 06/19/2022   BILITOT 1.0 06/19/2022   Lab Results  Component Value Date   CHOL 175 06/22/2022   HDL 51 06/22/2022   LDLCALC 85 06/22/2022   TRIG 195 (H) 06/22/2022   CHOLHDL 3.4 06/22/2022    Lab Results  Component Value Date   HGBA1C 6.7 (H) 06/22/2022     Assessment & Plan    1.  Atypical chest pain: -Patient recently seen in the ED with complaint of chest pain and underwent ACS workup that was negative -Myoview stress test results were negative and patient reports no recurrence of chest discomfort. -He will complete a coronary calcium scoring for further risk stratification. -He is aware to return to the ED if chest pain returns and is not relieved with as needed Nitrostat.  2.  Essential hypertension: -Patient's blood pressure today was initially elevated at 142/60 and was 138/88 on recheck. -Patient will document blood pressures over the next week and report findings back to the office. -Continue losartan 100 mg daily  3.  HFpEF: -Patient 2D echo was completed and was 60 to 65% with no RWMA and grade 1 DD and mild LVH and no valvular abnormalities. -Today patient is slightly volume up with trace lower extremity edema bilaterally. -He reports some indiscretions with salt and patient was advised to watch sodium intake. -We will have him take 40 mg of Lasix x 2 days and then return to 20 mg daily. -Low sodium diet, fluid restriction <2L, and daily weights encouraged. Educated  to contact our office for weight gain of 2 lbs overnight or 5 lbs in one week.   4.  Hyperlipidemia: -Patient's LDL cholesterol was 85 -He reports not taking ezetimibe over the past 2 months. -He will complete coronary calcium scoring and may have statin increased if indicated. -Continue for now Lipitor 40 mg daily  5.  Family history of CAD: -Patient has reported family history of CAD -He will complete coronary calcium scoring for further risk stratification.   Disposition: Follow-up with Charlton Haws, MD or APP in 6 months    Medication Adjustments/Labs and Tests Ordered: Current medicines are reviewed at length with the patient today.  Concerns regarding medicines are outlined above.   Signed, Napoleon Form, Leodis Rains, NP 07/07/2022, 7:38 AM [facility]

## 2022-07-08 ENCOUNTER — Encounter: Payer: Self-pay | Admitting: Nurse Practitioner

## 2022-07-08 ENCOUNTER — Ambulatory Visit: Payer: Commercial Managed Care - PPO | Admitting: Nurse Practitioner

## 2022-07-08 VITALS — BP 138/88 | HR 78 | Ht 71.0 in | Wt 309.8 lb

## 2022-07-08 DIAGNOSIS — R072 Precordial pain: Secondary | ICD-10-CM | POA: Insufficient documentation

## 2022-07-08 DIAGNOSIS — I5030 Unspecified diastolic (congestive) heart failure: Secondary | ICD-10-CM

## 2022-07-08 DIAGNOSIS — G4733 Obstructive sleep apnea (adult) (pediatric): Secondary | ICD-10-CM | POA: Insufficient documentation

## 2022-07-08 DIAGNOSIS — E782 Mixed hyperlipidemia: Secondary | ICD-10-CM

## 2022-07-08 DIAGNOSIS — Z8249 Family history of ischemic heart disease and other diseases of the circulatory system: Secondary | ICD-10-CM

## 2022-07-08 MED ORDER — EZETIMIBE 10 MG PO TABS: 10.0000 mg | ORAL_TABLET | Freq: Every day | ORAL | 1 refills | Status: DC

## 2022-07-08 NOTE — Patient Instructions (Addendum)
Medication Instructions:  Your physician recommends that you continue on your current medications as directed. Please refer to the Current Medication list given to you today. *If you need a refill on your cardiac medications before your next appointment, please call your pharmacy*   Lab Work: None ordered If you have labs (blood work) drawn today and your tests are completely normal, you will receive your results only by: MyChart Message (if you have MyChart) OR A paper copy in the mail If you have any lab test that is abnormal or we need to change your treatment, we will call you to review the results.   Testing/Procedures: Calcium Score (Self Pay)   Follow-Up: At [facility], you and your health needs are our priority.  As part of our continuing mission to provide you with exceptional heart care, we have created designated Provider Care Teams.  These Care Teams include your primary Cardiologist (physician) and Advanced Practice Providers (APPs -  Physician Assistants and Nurse Practitioners) who all work together to provide you with the care you need, when you need it.  We recommend signing up for the patient portal called "MyChart".  Sign up information is provided on this After Visit Summary.  MyChart is used to connect with patients for Virtual Visits (Telemedicine).  Patients are able to view lab/test results, encounter notes, upcoming appointments, etc.  Non-urgent messages can be sent to your provider as well.   To learn more about what you can do with MyChart, go to [email or web address].    Your next appointment:   6 month(s)  Provider:   Charlton Haws, MD     Other Instructions  Check your blood pressure daily for 2 weeks, then contact the office with your readings.

## 2022-08-11 ENCOUNTER — Ambulatory Visit (HOSPITAL_BASED_OUTPATIENT_CLINIC_OR_DEPARTMENT_OTHER)
Admission: RE | Admit: 2022-08-11 | Discharge: 2022-08-11 | Disposition: A | Discharge status: Home / Self Care | Payer: Commercial Managed Care - PPO | Source: Ambulatory Visit | Attending: Nurse Practitioner | Admitting: Nurse Practitioner

## 2022-08-11 DIAGNOSIS — I5030 Unspecified diastolic (congestive) heart failure: Secondary | ICD-10-CM | POA: Insufficient documentation

## 2022-08-11 DIAGNOSIS — R072 Precordial pain: Secondary | ICD-10-CM | POA: Insufficient documentation

## 2022-08-11 DIAGNOSIS — Z8249 Family history of ischemic heart disease and other diseases of the circulatory system: Secondary | ICD-10-CM | POA: Insufficient documentation

## 2022-08-11 DIAGNOSIS — E782 Mixed hyperlipidemia: Secondary | ICD-10-CM | POA: Insufficient documentation

## 2022-08-11 DIAGNOSIS — G4733 Obstructive sleep apnea (adult) (pediatric): Secondary | ICD-10-CM | POA: Insufficient documentation

## 2022-08-12 ENCOUNTER — Telehealth: Payer: Self-pay | Admitting: Nurse Practitioner

## 2022-08-12 DIAGNOSIS — Z01812 Encounter for preprocedural laboratory examination: Secondary | ICD-10-CM

## 2022-08-12 DIAGNOSIS — R072 Precordial pain: Secondary | ICD-10-CM

## 2022-08-12 MED ORDER — METOPROLOL TARTRATE 100 MG PO TABS
100.0000 mg | ORAL_TABLET | Freq: Once | ORAL | 0 refills | Status: DC
Start: 2022-08-12 — End: 2022-09-16

## 2022-08-12 NOTE — Telephone Encounter (Signed)
Spoke with patient and discussed lab results.  Per Robin Searing, NP: Please let patient know that his cardiac calcium score is elevated and shows potential high calcium in 2 of his coronary arteries.  There was also aortic atherosclerosis noted which requires further evaluation.   Plan: - Please order coronary CTA to rule out atherosclerotic plaque and to analyze plaque burden. -Patient will need BMET prior to test. -Please advise if patient has had any additional chest pain since his follow-up on 07/08/2022. -Please let me know if have any further questions.  Patient denies any further episodes of chest pain since OV on 07/08/22. Agreeable to proceeding with coronary CTA. Reviewed instructions with patient.   BMET ordered and lab appt scheduled for 08/27/22.  Result note from Alden Server and instructions for coronary CTA sent to patient via MyChart as discussed during phone conversation.  Patient verbalized understanding and expressed appreciation for call.

## 2022-08-12 NOTE — Telephone Encounter (Signed)
-----   Message from Napoleon Form, Leodis Rains sent at 08/11/2022  5:24 PM EDT ----- Please let patient know that his cardiac calcium score is elevated and shows potential high calcium in 2 of his coronary arteries.  There was also aortic atherosclerosis noted which requires further evaluation.  Plan: - Please order coronary CTA to rule out atherosclerotic plaque and to analyze plaque burden. -Patient will need BMET prior to test. -Please advise if patient has had any additional chest pain since his follow-up on 07/08/2022. -Please let me know if have any further questions.   Robin Searing, NP

## 2022-08-22 ENCOUNTER — Ambulatory Visit: Payer: Commercial Managed Care - PPO | Attending: Nurse Practitioner

## 2022-08-22 DIAGNOSIS — Z01812 Encounter for preprocedural laboratory examination: Secondary | ICD-10-CM

## 2022-08-25 ENCOUNTER — Other Ambulatory Visit: Payer: Self-pay | Admitting: Nurse Practitioner

## 2022-08-27 ENCOUNTER — Encounter (HOSPITAL_COMMUNITY): Payer: Self-pay

## 2022-08-27 ENCOUNTER — Other Ambulatory Visit: Payer: Commercial Managed Care - PPO

## 2022-08-27 ENCOUNTER — Ambulatory Visit: Payer: Commercial Managed Care - PPO

## 2022-08-29 ENCOUNTER — Ambulatory Visit (HOSPITAL_BASED_OUTPATIENT_CLINIC_OR_DEPARTMENT_OTHER)
Admission: RE | Admit: 2022-08-29 | Discharge: 2022-08-29 | Disposition: A | Discharge status: Home / Self Care | Payer: Commercial Managed Care - PPO | Source: Ambulatory Visit | Attending: Internal Medicine | Admitting: Internal Medicine

## 2022-08-29 ENCOUNTER — Ambulatory Visit (HOSPITAL_COMMUNITY)
Admission: RE | Admit: 2022-08-29 | Discharge: 2022-08-29 | Disposition: A | Discharge status: Home / Self Care | Payer: Commercial Managed Care - PPO | Source: Ambulatory Visit | Attending: Nurse Practitioner | Admitting: Nurse Practitioner

## 2022-08-29 VITALS — BP 146/70 | HR 62

## 2022-08-29 DIAGNOSIS — R072 Precordial pain: Secondary | ICD-10-CM | POA: Insufficient documentation

## 2022-08-29 DIAGNOSIS — I2510 Atherosclerotic heart disease of native coronary artery without angina pectoris: Secondary | ICD-10-CM

## 2022-08-29 DIAGNOSIS — I200 Unstable angina: Secondary | ICD-10-CM | POA: Diagnosis not present

## 2022-08-29 DIAGNOSIS — R931 Abnormal findings on diagnostic imaging of heart and coronary circulation: Secondary | ICD-10-CM | POA: Diagnosis not present

## 2022-08-29 MED ORDER — NITROGLYCERIN 0.4 MG SL SUBL
0.8000 mg | SUBLINGUAL_TABLET | Freq: Once | SUBLINGUAL | Status: AC
  Administered 2022-08-29: 0.8 mg via SUBLINGUAL

## 2022-08-29 MED ORDER — NITROGLYCERIN 0.4 MG SL SUBL
SUBLINGUAL_TABLET | SUBLINGUAL | Status: AC
  Filled 2022-08-29: qty 2

## 2022-08-29 MED ORDER — IOHEXOL 350 MG/ML SOLN
115.0000 mL | Freq: Once | INTRAVENOUS | Status: AC | PRN
  Administered 2022-08-29: 115 mL via INTRAVENOUS

## 2022-09-08 ENCOUNTER — Telehealth: Payer: Self-pay | Admitting: Physician Assistant

## 2022-09-08 NOTE — Telephone Encounter (Signed)
Left message for patient stating I do not see any tests scheduled for 8/20. Patient has appt on 8/19 to discuss heart cath with Robin Searing, NP and the need for PET CT can be discussed at that time.  Provided office number for callback.

## 2022-09-08 NOTE — Telephone Encounter (Signed)
Patient is calling about a CT PET scan he is scheduled for on 09/16/22 he wants to know if he needs to have this scan done.  He states he had a CT scan done earlier this month that he still hasn't seen the doctor about.

## 2022-09-14 NOTE — Progress Notes (Unsigned)
Office Visit    Patient Name: David Ortega Date of Encounter: 09/14/2022  Primary Care Provider:  Bennie Pierini, FNP Primary Cardiologist:  Charlton Haws, MD Primary Electrophysiologist: None   Past Medical History    Past Medical History:  Diagnosis Date   Asthma    COPD (chronic obstructive pulmonary disease) (HCC)    Dyslipidemia    GERD (gastroesophageal reflux disease)    Hyperlipidemia    Hypertension    Kidney stones 11/2013   Precordial chest pain 07/02/2022   Myoview 07/07/22: EF 55, no ischemia, low risk   Past Surgical History:  Procedure Laterality Date   BACK SURGERY     CHOLECYSTECTOMY     COLONOSCOPY N/A 09/05/2016   Procedure: COLONOSCOPY;  Surgeon: Corbin Ade, MD;  [facility];  Service: Endoscopy;  Laterality: N/A;  2:15 PM   ESOPHAGOGASTRODUODENOSCOPY     LEFT ANKLE SURGERY      Allergies  Allergies  Allergen Reactions   Propranolol Shortness Of Breath   Lisinopril Cough   Simvastatin     Leg pain     History of Present Illness    David Ortega  is a 62 year old male with a PMH of HTN, HLD, COPD, OSA (on CPAP), HFpEF, family history of CAD, active smoker, obesity who presents today to discuss results of coronary CTA.  David Ortega was last seen on 07/08/2022 following ED visit for complaint of chest pain.  He underwent a Lexiscan Myoview that was normal.  He reported doing well and blood pressures were initially elevated at 142/60 and was improved at 138/88 on recheck.  Patient has elevated risk of CAD and was referred for calcium score wearing that came back abnormal and was then sent for coronary CTA that showed increased calcium and elevated score of 4164 placing at 99%.  He will require a LHC for further evaluation.  Since last being seen in the office patient reports***.  Patient denies chest pain, palpitations, dyspnea, PND, orthopnea, nausea, vomiting, dizziness, syncope, edema, weight gain, or early  satiety.     ***Notes:  Home Medications    Current Outpatient Medications  Medication Sig Dispense Refill   albuterol (VENTOLIN HFA) 108 (90 Base) MCG/ACT inhaler INHALE 2 PUFFS BY MOUTH EVERY 6 HOURS AS NEEDED FOR WHEEZING AND SHORTNESS OF BREATH . APPOINTMENT REQUIRED FOR FUTURE REFILLS 9 g 4   aspirin EC 81 MG tablet Take 81 mg by mouth daily. Swallow whole.     atorvastatin (LIPITOR) 40 MG tablet TAKE 1 TABLET BY MOUTH ONCE DAILY -NEED  APPT  FOR  MORE  REFILLS- (Patient taking differently: Take 40 mg by mouth daily. TAKE 1 TABLET BY MOUTH ONCE DAILY -NEED  APPT  FOR  MORE  REFILLS-) 90 tablet 1   budesonide-formoterol (SYMBICORT) 160-4.5 MCG/ACT inhaler Inhale 2 puffs into the lungs 2 (two) times daily. 33 g 3   buPROPion (WELLBUTRIN XL) 300 MG 24 hr tablet Take 1 tablet (300 mg total) by mouth daily. 90 tablet 1   busPIRone (BUSPAR) 15 MG tablet Take 1 tablet (15 mg total) by mouth 2 (two) times daily. 60 tablet 2   escitalopram (LEXAPRO) 20 MG tablet Take 1 tablet (20 mg total) by mouth daily. 90 tablet 1   ezetimibe (ZETIA) 10 MG tablet Take 1 tablet (10 mg total) by mouth at bedtime. 90 tablet 1   furosemide (LASIX) 20 MG tablet Take 1 tablet (20 mg total) by mouth daily as  needed for edema. 90 tablet 1   losartan (COZAAR) 100 MG tablet Take 1/2 tab (50 mg) on Monday 5/27. On 5/28, resume 1 tab (100 mg). 90 tablet 1   meclizine (ANTIVERT) 25 MG tablet Take 1 tablet (25 mg total) by mouth 2 (two) times daily as needed for dizziness.     meloxicam (MOBIC) 15 MG tablet Take 1 tablet by mouth once daily 30 tablet 0   metoprolol tartrate (LOPRESSOR) 100 MG tablet Take 1 tablet (100 mg total) by mouth once for 1 dose. Take 90-120 minutes prior to scan. Hold for SBP less than 110. 1 tablet 0   mupirocin ointment (BACTROBAN) 2 % Apply 1 application. topically 2 (two) times daily. 22 g 0   nitroGLYCERIN (NITROSTAT) 0.4 MG SL tablet Place 1 tablet (0.4 mg total) under the tongue every 5  (five) minutes x 3 doses as needed for chest pain. 25 tablet 12   omeprazole (PRILOSEC) 40 MG capsule Take 1 capsule (40 mg total) by mouth daily. 30 capsule 0   triamcinolone cream (KENALOG) 0.1 % APPLY ONE APPLICATION TWICE DAILY (Patient taking differently: Apply 1 Application topically 2 (two) times daily.) 453.6 g 1   No current facility-administered medications for this visit.     Review of Systems  Please see the history of present illness.    (+)*** (+)***  All other systems reviewed and are otherwise negative except as noted above.  Physical Exam    Wt Readings from Last 3 Encounters:  07/08/22 (!) 309 lb 12.8 oz (140.5 kg)  07/07/22 (!) 301 lb (136.5 kg)  06/22/22 (!) 301 lb 12.8 oz (136.9 kg)   ZO:XWRUE were no vitals filed for this visit.,There is no height or weight on file to calculate BMI.  Constitutional:      Appearance: Healthy appearance. Not in distress.  Neck:     Vascular: JVD normal.  Pulmonary:     Effort: Pulmonary effort is normal.     Breath sounds: No wheezing. No rales. Diminished in the bases Cardiovascular:     Normal rate. Regular rhythm. Normal S1. Normal S2.      Murmurs: There is no murmur.  Edema:    Peripheral edema absent.  Abdominal:     Palpations: Abdomen is soft non tender. There is no hepatomegaly.  Skin:    General: Skin is warm and dry.  Neurological:     General: No focal deficit present.     Mental Status: Alert and oriented to person, place and time.     Cranial Nerves: Cranial nerves are intact.  EKG/LABS/ Recent Cardiac Studies    ECG personally reviewed by me today - ***   Risk Assessment/Calculations:   {Does this patient have ATRIAL FIBRILLATION?:[phone]}        Lab Results  Component Value Date   WBC 11.6 (H) 06/22/2022   HGB 14.1 06/22/2022   HCT 43.7 06/22/2022   MCV 100.7 (H) 06/22/2022   PLT 225 06/22/2022   Lab Results  Component Value Date   CREATININE 1.02 08/22/2022   BUN 13 08/22/2022    NA 140 08/22/2022   K 4.8 08/22/2022   CL 103 08/22/2022   CO2 23 08/22/2022   Lab Results  Component Value Date   ALT 28 06/19/2022   AST 22 06/19/2022   ALKPHOS 134 (H) 06/19/2022   BILITOT 1.0 06/19/2022   Lab Results  Component Value Date   CHOL 175 06/22/2022   HDL 51 06/22/2022   LDLCALC  85 06/22/2022   TRIG 195 (H) 06/22/2022   CHOLHDL 3.4 06/22/2022    Lab Results  Component Value Date   HGBA1C 6.7 (H) 06/22/2022     Assessment & Plan    1.  Coronary artery disease  2.  Essential hypertension  3.  HFpEF  4.  Hyperlipidemia        Disposition: Follow-up with Charlton Haws, MD or APP in *** months {Are you ordering a CV Procedure (e.g. stress test, cath, DCCV, TEE, etc)?   Press F2        :[id number]}   Medication Adjustments/Labs and Tests Ordered: Current medicines are reviewed at length with the patient today.  Concerns regarding medicines are outlined above.   Signed, Napoleon Form, Leodis Rains, NP 09/14/2022, 5:11 PM [facility]

## 2022-09-14 NOTE — H&P (View-Only) (Signed)
Office Visit    Patient Name: David Ortega Date of Encounter: 09/14/2022  Primary Care Provider:  Bennie Pierini, FNP Primary Cardiologist:  Charlton Haws, MD Primary Electrophysiologist: None   Past Medical History    Past Medical History:  Diagnosis Date   Asthma    COPD (chronic obstructive pulmonary disease) (HCC)    Dyslipidemia    GERD (gastroesophageal reflux disease)    Hyperlipidemia    Hypertension    Kidney stones 11/2013   Precordial chest pain 07/02/2022   Myoview 07/07/22: EF 55, no ischemia, low risk   Past Surgical History:  Procedure Laterality Date   BACK SURGERY     CHOLECYSTECTOMY     COLONOSCOPY N/A 09/05/2016   Procedure: COLONOSCOPY;  Surgeon: Corbin Ade, MD;  [facility]: [facility];  Service: Endoscopy;  Laterality: N/A;  2:15 PM   ESOPHAGOGASTRODUODENOSCOPY     LEFT ANKLE SURGERY      Allergies  Allergies  Allergen Reactions   Propranolol Shortness Of Breath   Lisinopril Cough   Simvastatin     Leg pain     History of Present Illness    David Ortega  is a 62 year old male with a PMH of HTN, HLD, COPD, OSA (on CPAP), HFpEF, family history of CAD, active smoker, obesity who presents today to discuss results of coronary CTA.  David Ortega was last seen on 07/08/2022 following ED visit for complaint of chest pain.  He underwent a Lexiscan Myoview that was normal.  He reported doing well and blood pressures were initially elevated at 142/60 and was improved at 138/88 on recheck.  Patient has elevated risk of CAD and was referred for calcium score wearing that came back abnormal and was then sent for coronary CTA that showed increased calcium and elevated score of 4164 placing at 99%.  He will require a LHC for further evaluation.  Since last being seen in the office patient reports that he has continued to experience shortness of breath but denies any chest pain.  His blood pressure today is controlled at 130/82 and heart  rate was elevated at 97 bpm due to using his inhaler prior to visit.  He is compliant with his current medications and denies any adverse reactions.  He is euvolemic with +1 bilateral lower extremity edema.  During today's visit we discussed the left heart catheterization procedure and reviewed the consent in detail.  Patient and wife had all questions answered to their satisfaction.  Patient denies chest pain, palpitations, dyspnea, PND, orthopnea, nausea, vomiting, dizziness, syncope, edema, weight gain, or early satiety.   Home Medications    Current Outpatient Medications  Medication Sig Dispense Refill   albuterol (VENTOLIN HFA) 108 (90 Base) MCG/ACT inhaler INHALE 2 PUFFS BY MOUTH EVERY 6 HOURS AS NEEDED FOR WHEEZING AND SHORTNESS OF BREATH . APPOINTMENT REQUIRED FOR FUTURE REFILLS 9 g 4   aspirin EC 81 MG tablet Take 81 mg by mouth daily. Swallow whole.     atorvastatin (LIPITOR) 40 MG tablet TAKE 1 TABLET BY MOUTH ONCE DAILY -NEED  APPT  FOR  MORE  REFILLS- (Patient taking differently: Take 40 mg by mouth daily. TAKE 1 TABLET BY MOUTH ONCE DAILY -NEED  APPT  FOR  MORE  REFILLS-) 90 tablet 1   budesonide-formoterol (SYMBICORT) 160-4.5 MCG/ACT inhaler Inhale 2 puffs into the lungs 2 (two) times daily. 33 g 3   buPROPion (WELLBUTRIN XL) 300 MG 24 hr tablet Take 1 tablet (300  mg total) by mouth daily. 90 tablet 1   busPIRone (BUSPAR) 15 MG tablet Take 1 tablet (15 mg total) by mouth 2 (two) times daily. 60 tablet 2   escitalopram (LEXAPRO) 20 MG tablet Take 1 tablet (20 mg total) by mouth daily. 90 tablet 1   ezetimibe (ZETIA) 10 MG tablet Take 1 tablet (10 mg total) by mouth at bedtime. 90 tablet 1   furosemide (LASIX) 20 MG tablet Take 1 tablet (20 mg total) by mouth daily as needed for edema. 90 tablet 1   losartan (COZAAR) 100 MG tablet Take 1/2 tab (50 mg) on Monday 5/27. On 5/28, resume 1 tab (100 mg). 90 tablet 1   meclizine (ANTIVERT) 25 MG tablet Take 1 tablet (25 mg total) by mouth 2  (two) times daily as needed for dizziness.     meloxicam (MOBIC) 15 MG tablet Take 1 tablet by mouth once daily 30 tablet 0   metoprolol tartrate (LOPRESSOR) 100 MG tablet Take 1 tablet (100 mg total) by mouth once for 1 dose. Take 90-120 minutes prior to scan. Hold for SBP less than 110. 1 tablet 0   mupirocin ointment (BACTROBAN) 2 % Apply 1 application. topically 2 (two) times daily. 22 g 0   nitroGLYCERIN (NITROSTAT) 0.4 MG SL tablet Place 1 tablet (0.4 mg total) under the tongue every 5 (five) minutes x 3 doses as needed for chest pain. 25 tablet 12   omeprazole (PRILOSEC) 40 MG capsule Take 1 capsule (40 mg total) by mouth daily. 30 capsule 0   triamcinolone cream (KENALOG) 0.1 % APPLY ONE APPLICATION TWICE DAILY (Patient taking differently: Apply 1 Application topically 2 (two) times daily.) 453.6 g 1   No current facility-administered medications for this visit.     Review of Systems  Please see the history of present illness.    (+) Lower extremity swelling (+) Chronic shortness of breath  All other systems reviewed and are otherwise negative except as noted above.  Physical Exam    Wt Readings from Last 3 Encounters:  07/08/22 (!) 309 lb 12.8 oz (140.5 kg)  07/07/22 (!) 301 lb (136.5 kg)  06/22/22 (!) 301 lb 12.8 oz (136.9 kg)   ZO:XWRUE were no vitals filed for this visit.,There is no height or weight on file to calculate BMI.  Constitutional:      Appearance: Healthy appearance. Not in distress.  Neck:     Vascular: JVD normal.  Pulmonary:     Effort: Pulmonary effort is normal.     Breath sounds: No wheezing. No rales. Diminished in the bases Cardiovascular:     Normal rate. Regular rhythm. Normal S1. Normal S2.      Murmurs: There is no murmur.  Edema:    Peripheral edema absent.  Abdominal:     Palpations: Abdomen is soft non tender. There is no hepatomegaly.  Skin:    General: Skin is warm and dry.  Neurological:     General: No focal deficit present.      Mental Status: Alert and oriented to person, place and time.     Cranial Nerves: Cranial nerves are intact.  EKG/LABS/ Recent Cardiac Studies    ECG personally reviewed by me today -sinus rhythm with a rate of 97 bpm and no acute changes consistent with previous EKG.   Lab Results  Component Value Date   CREATININE 1.02 08/22/2022   BUN 13 08/22/2022   NA 140 08/22/2022   K 4.8 08/22/2022   CL 103 08/22/2022  CO2 23 08/22/2022   Lab Results  Component Value Date   ALT 28 06/19/2022   AST 22 06/19/2022   ALKPHOS 134 (H) 06/19/2022   BILITOT 1.0 06/19/2022   Lab Results  Component Value Date   CHOL 175 06/22/2022   HDL 51 06/22/2022   LDLCALC 85 06/22/2022   TRIG 195 (H) 06/22/2022   CHOLHDL 3.4 06/22/2022    Lab Results  Component Value Date   HGBA1C 6.7 (H) 06/22/2022     Assessment & Plan    1.  Coronary artery disease: -s/p CTA with elevated calcium score and FFR showing significant functional stenosis in LAD -Today patient reports no chest pain but does note shortness of breath which is chronic. -Will complete LHC for further evaluation due to abnormal CTA. -BMET and CBC today -Continue GDMT with ASA 81 mg, Zetia 10 mg,.  Nitrostat Lipitor 40 mg  2.  Essential hypertension: -Patient's blood pressure today was controlled at 130/82 -Continue losartan 100 mg daily  3.  HFpEF: -Patient's 2D echo with an EF of 60 to 65% no RWMA with mild concentric LVH and grade 1 DD -Patient is euvolemic with bilateral lower extremity edema present. -Patient will continue Lasix 20 mg as needed -Low sodium diet, fluid restriction <2L, and daily weights encouraged. Educated to contact our office for weight gain of 2 lbs overnight or 5 lbs in one week.   4.  Hyperlipidemia: -Patient's last LDL cholesterol was 85 -Continue Lipitor and ezetimibe  5.  Chronic shortness of breath: -Patient was recently referred to pulmonology by PCP.  Disposition: Follow-up with Charlton Haws,  MD or APP in 1 months Informed Consent   Shared Decision Making/Informed Consent The risks [stroke (1 in 1000), death (1 in 1000), kidney failure [usually temporary] (1 in 500), bleeding (1 in 200), allergic reaction [possibly serious] (1 in 200)], benefits (diagnostic support and management of coronary artery disease) and alternatives of a cardiac catheterization were discussed in detail with David Ortega and he is willing to proceed.      Medication Adjustments/Labs and Tests Ordered: Current medicines are reviewed at length with the patient today.  Concerns regarding medicines are outlined above.   Signed, Napoleon Form, Leodis Rains, NP 09/14/2022, 5:11 PM [facility]

## 2022-09-15 ENCOUNTER — Other Ambulatory Visit: Payer: Self-pay | Admitting: Nurse Practitioner

## 2022-09-15 ENCOUNTER — Ambulatory Visit: Payer: Commercial Managed Care - PPO | Attending: Nurse Practitioner | Admitting: Nurse Practitioner

## 2022-09-15 ENCOUNTER — Encounter: Payer: Self-pay | Admitting: Nurse Practitioner

## 2022-09-15 VITALS — BP 130/82 | HR 97 | Ht 71.0 in | Wt 312.0 lb

## 2022-09-15 DIAGNOSIS — R42 Dizziness and giddiness: Secondary | ICD-10-CM

## 2022-09-15 DIAGNOSIS — I2510 Atherosclerotic heart disease of native coronary artery without angina pectoris: Secondary | ICD-10-CM | POA: Diagnosis not present

## 2022-09-15 DIAGNOSIS — I5032 Chronic diastolic (congestive) heart failure: Secondary | ICD-10-CM

## 2022-09-15 DIAGNOSIS — R0602 Shortness of breath: Secondary | ICD-10-CM

## 2022-09-15 DIAGNOSIS — I10 Essential (primary) hypertension: Secondary | ICD-10-CM

## 2022-09-15 DIAGNOSIS — E782 Mixed hyperlipidemia: Secondary | ICD-10-CM

## 2022-09-15 NOTE — Patient Instructions (Addendum)
Medication Instructions:  Your physician recommends that you continue on your current medications as directed. Please refer to the Current Medication list given to you today.  *If you need a refill on your cardiac medications before your next appointment, please call your pharmacy*   Lab Work: TODAY:  BMET & CBC  If you have labs (blood work) drawn today and your tests are completely normal, you will receive your results only by: MyChart Message (if you have MyChart) OR A paper copy in the mail If you have any lab test that is abnormal or we need to change your treatment, we will call you to review the results.   Testing/Procedures: Your physician has requested that you have a cardiac catheterization. Cardiac catheterization is used to diagnose and/or treat various heart conditions. Doctors may recommend this procedure for a number of different reasons. The most common reason is to evaluate chest pain. Chest pain can be a symptom of coronary artery disease (CAD), and cardiac catheterization can show whether plaque is narrowing or blocking your heart's arteries. This procedure is also used to evaluate the valves, as well as measure the blood flow and oxygen levels in different parts of your heart. For further information please visit [email or web address] Please follow instruction sheet, BELOW:        Cardiac/Peripheral Catheterization   You are scheduled for a Cardiac Catheterization on Wednesday, August 21 with Dr. Lance Muss.  1. Please arrive at the [facility] (Main Entrance A) at [facility]: [address] [place], [place] [address] at 9:30 AM (This time is 2 hour(s) before your procedure to ensure your preparation). Free valet parking service is available. You will check in at ADMITTING. The support person will be asked to wait in the waiting room.  It is OK to have someone drop you off and come back when you are ready to be discharged.        Special note: Every  effort is made to have your procedure done on time. Please understand that emergencies sometimes delay scheduled procedures.  2. Diet: Do not eat solid foods after midnight.  You may have clear liquids until 5 AM the day of the procedure.  3. Labs: You will need to have blood drawn on TODAY  4. Medication instructions in preparation for your procedure:   Contrast Allergy: No   Stop taking, Cozaar (Losartan) Tuesday, August 20 (MAKE SURE TO TAKE BEFORE 9:30 THIS DAY), Lasix (Furosemide)  Tuesday, August 21,   On the morning of your procedure, take Aspirin 81 mg and any morning medicines NOT listed above.  You may use sips of water.  5. Plan to go home the same day, you will only stay overnight if medically necessary. 6. You MUST have a responsible adult to drive you home. 7. An adult MUST be with you the first 24 hours after you arrive home. 8. Bring a current list of your medications, and the last time and date medication taken. 9. Bring ID and current insurance cards. 10.Please wear clothes that are easy to get on and off and wear slip-on shoes.  Thank you for allowing [place] to care for you!   -- [facility]   Follow-Up: At [facility], you and your health needs are our priority.  As part of our continuing mission to provide you with exceptional heart care, we have created designated Provider Care Teams.  These Care Teams include your primary Cardiologist (physician) and Advanced Practice Providers (  APPs -  Physician Assistants and Nurse Practitioners) who all work together to provide you with the care you need, when you need it.  We recommend signing up for the patient portal called "MyChart".  Sign up information is provided on this After Visit Summary.  MyChart is used to connect with patients for Virtual Visits (Telemedicine).  Patients are able to view lab/test results, encounter notes, upcoming appointments, etc.  Non-urgent messages can be  sent to your provider as well.   To learn more about what you can do with MyChart, go to [email or web address].    Your next appointment:   2 week(s)  Provider:   Robin Searing, NP         Other Instructions

## 2022-09-16 ENCOUNTER — Encounter (HOSPITAL_COMMUNITY): Admission: RE | Admit: 2022-09-16 | Payer: Commercial Managed Care - PPO | Source: Ambulatory Visit

## 2022-09-16 ENCOUNTER — Telehealth: Payer: Self-pay | Admitting: *Deleted

## 2022-09-16 LAB — CBC
Hematocrit: 43.7 % (ref 37.5–51.0)
Hemoglobin: 15.4 g/dL (ref 13.0–17.7)
MCH: 34.1 pg — ABNORMAL HIGH (ref 26.6–33.0)
MCHC: 35.2 g/dL (ref 31.5–35.7)
MCV: 97 fL (ref 79–97)
Platelets: 254 10*3/uL (ref 150–450)
RBC: 4.52 x10E6/uL (ref 4.14–5.80)
RDW: 13 % (ref 11.6–15.4)
WBC: 12.1 10*3/uL — ABNORMAL HIGH (ref 3.4–10.8)

## 2022-09-16 LAB — BASIC METABOLIC PANEL
BUN/Creatinine Ratio: 13 (ref 10–24)
BUN: 15 mg/dL (ref 8–27)
CO2: 21 mmol/L (ref 20–29)
Calcium: 9.5 mg/dL (ref 8.6–10.2)
Chloride: 101 mmol/L (ref 96–106)
Creatinine, Ser: 1.16 mg/dL (ref 0.76–1.27)
Glucose: 107 mg/dL — ABNORMAL HIGH (ref 70–99)
Potassium: 4.3 mmol/L (ref 3.5–5.2)
Sodium: 137 mmol/L (ref 134–144)
eGFR: 71 mL/min/{1.73_m2} (ref >59)

## 2022-09-16 NOTE — Telephone Encounter (Signed)
Cardiac Catheterization scheduled at [facility] for: Wednesday September 17, 2022 11:30 AM Arrival time [facility] Main Entrance A at: 9:30 AM  Nothing to eat after midnight prior to procedure, clear liquids until 5 AM day of procedure.  Medication instructions: -Hold:  Lasix-AM of procedure -Other usual morning medications can be taken with sips of water including aspirin 81 mg.  Plan to go home the same day, you will only stay overnight if medically necessary.  You must have responsible adult to drive you home.  Someone must be with you the first 24 hours after you arrive home.  Reviewed procedure instructions with patient's wife (DPR), Darel Hong.

## 2022-09-17 ENCOUNTER — Other Ambulatory Visit: Payer: Self-pay

## 2022-09-17 ENCOUNTER — Encounter (HOSPITAL_COMMUNITY)
Admission: RE | Disposition: A | Discharge status: Home / Self Care | Payer: Self-pay | Source: Home / Self Care | Attending: Cardiology

## 2022-09-17 ENCOUNTER — Ambulatory Visit (HOSPITAL_COMMUNITY)
Admission: RE | Admit: 2022-09-17 | Discharge: 2022-09-17 | Disposition: A | Discharge status: Home / Self Care | Payer: Commercial Managed Care - PPO | Attending: Cardiology | Admitting: Cardiology

## 2022-09-17 DIAGNOSIS — Z79899 Other long term (current) drug therapy: Secondary | ICD-10-CM | POA: Insufficient documentation

## 2022-09-17 DIAGNOSIS — R0609 Other forms of dyspnea: Secondary | ICD-10-CM | POA: Insufficient documentation

## 2022-09-17 DIAGNOSIS — J449 Chronic obstructive pulmonary disease, unspecified: Secondary | ICD-10-CM | POA: Insufficient documentation

## 2022-09-17 DIAGNOSIS — Z8249 Family history of ischemic heart disease and other diseases of the circulatory system: Secondary | ICD-10-CM | POA: Diagnosis not present

## 2022-09-17 DIAGNOSIS — I2584 Coronary atherosclerosis due to calcified coronary lesion: Secondary | ICD-10-CM | POA: Insufficient documentation

## 2022-09-17 DIAGNOSIS — G4733 Obstructive sleep apnea (adult) (pediatric): Secondary | ICD-10-CM | POA: Diagnosis not present

## 2022-09-17 DIAGNOSIS — I2510 Atherosclerotic heart disease of native coronary artery without angina pectoris: Secondary | ICD-10-CM | POA: Insufficient documentation

## 2022-09-17 DIAGNOSIS — Z7982 Long term (current) use of aspirin: Secondary | ICD-10-CM | POA: Diagnosis not present

## 2022-09-17 DIAGNOSIS — E785 Hyperlipidemia, unspecified: Secondary | ICD-10-CM | POA: Diagnosis not present

## 2022-09-17 DIAGNOSIS — I5032 Chronic diastolic (congestive) heart failure: Secondary | ICD-10-CM | POA: Insufficient documentation

## 2022-09-17 DIAGNOSIS — I110 Hypertensive heart disease with heart failure: Secondary | ICD-10-CM | POA: Diagnosis not present

## 2022-09-17 DIAGNOSIS — R0602 Shortness of breath: Secondary | ICD-10-CM

## 2022-09-17 HISTORY — PX: LEFT HEART CATH AND CORONARY ANGIOGRAPHY: CATH118249

## 2022-09-17 SURGERY — LEFT HEART CATH AND CORONARY ANGIOGRAPHY
Anesthesia: LOCAL

## 2022-09-17 MED ORDER — MIDAZOLAM HCL 2 MG/2ML IJ SOLN
INTRAMUSCULAR | Status: AC
  Filled 2022-09-17: qty 2

## 2022-09-17 MED ORDER — HEPARIN SODIUM (PORCINE) 1000 UNIT/ML IJ SOLN
INTRAMUSCULAR | Status: AC
  Filled 2022-09-17: qty 10

## 2022-09-17 MED ORDER — FENTANYL CITRATE (PF) 100 MCG/2ML IJ SOLN
INTRAMUSCULAR | Status: AC
  Filled 2022-09-17: qty 2

## 2022-09-17 MED ORDER — FENTANYL CITRATE (PF) 100 MCG/2ML IJ SOLN
INTRAMUSCULAR | Status: DC | PRN
  Administered 2022-09-17: 25 ug via INTRAVENOUS

## 2022-09-17 MED ORDER — ASPIRIN 81 MG PO CHEW: 81.0000 mg | CHEWABLE_TABLET | ORAL | Status: DC

## 2022-09-17 MED ORDER — SODIUM CHLORIDE 0.9% FLUSH: 3.0000 mL | Freq: Two times a day (BID) | INTRAVENOUS | Status: DC

## 2022-09-17 MED ORDER — LIDOCAINE HCL (PF) 1 % IJ SOLN
INTRAMUSCULAR | Status: DC | PRN
  Administered 2022-09-17: 2 mL

## 2022-09-17 MED ORDER — SODIUM CHLORIDE 0.9 % WEIGHT BASED INFUSION
3.0000 mL/kg/h | INTRAVENOUS | Status: AC
  Administered 2022-09-17: 3 mL/kg/h via INTRAVENOUS

## 2022-09-17 MED ORDER — ACETAMINOPHEN 325 MG PO TABS: 650.0000 mg | ORAL_TABLET | ORAL | Status: DC | PRN

## 2022-09-17 MED ORDER — VERAPAMIL HCL 2.5 MG/ML IV SOLN
INTRAVENOUS | Status: DC | PRN
  Administered 2022-09-17: 10 mL via INTRA_ARTERIAL

## 2022-09-17 MED ORDER — HEPARIN SODIUM (PORCINE) 1000 UNIT/ML IJ SOLN
INTRAMUSCULAR | Status: DC | PRN
  Administered 2022-09-17: 6000 [IU] via INTRAVENOUS

## 2022-09-17 MED ORDER — SODIUM CHLORIDE 0.9 % WEIGHT BASED INFUSION: 1.0000 mL/kg/h | INTRAVENOUS | Status: DC

## 2022-09-17 MED ORDER — ISOSORBIDE MONONITRATE ER 30 MG PO TB24: 30.0000 mg | ORAL_TABLET | Freq: Every day | ORAL | 3 refills | Status: DC

## 2022-09-17 MED ORDER — SODIUM CHLORIDE 0.9 % IV SOLN: 250.0000 mL | INTRAVENOUS | Status: DC | PRN

## 2022-09-17 MED ORDER — VERAPAMIL HCL 2.5 MG/ML IV SOLN
INTRAVENOUS | Status: AC
  Filled 2022-09-17: qty 2

## 2022-09-17 MED ORDER — ONDANSETRON HCL 4 MG/2ML IJ SOLN: 4.0000 mg | Freq: Four times a day (QID) | INTRAMUSCULAR | Status: DC | PRN

## 2022-09-17 MED ORDER — IOHEXOL 350 MG/ML SOLN
INTRAVENOUS | Status: DC | PRN
  Administered 2022-09-17: 74 mL

## 2022-09-17 MED ORDER — SODIUM CHLORIDE 0.9 % IV SOLN: INTRAVENOUS | Status: DC

## 2022-09-17 MED ORDER — LIDOCAINE HCL (PF) 1 % IJ SOLN
INTRAMUSCULAR | Status: AC
  Filled 2022-09-17: qty 30

## 2022-09-17 MED ORDER — MIDAZOLAM HCL 2 MG/2ML IJ SOLN
INTRAMUSCULAR | Status: DC | PRN
  Administered 2022-09-17: 1 mg via INTRAVENOUS

## 2022-09-17 MED ORDER — HYDRALAZINE HCL 20 MG/ML IJ SOLN: 10.0000 mg | INTRAMUSCULAR | Status: DC | PRN

## 2022-09-17 MED ORDER — HEPARIN (PORCINE) IN NACL 1000-0.9 UT/500ML-% IV SOLN
INTRAVENOUS | Status: DC | PRN
  Administered 2022-09-17 (×2): 500 mL

## 2022-09-17 MED ORDER — SODIUM CHLORIDE 0.9% FLUSH: 3.0000 mL | INTRAVENOUS | Status: DC | PRN

## 2022-09-17 SURGICAL SUPPLY — 7 items
CATH 5FR JL3.5 JR4 ANG PIG MP (CATHETERS) IMPLANT
DEVICE RAD COMP TR BAND LRG (VASCULAR PRODUCTS) IMPLANT
GLIDESHEATH SLEND SS 6F .021 (SHEATH) IMPLANT
GUIDEWIRE INQWIRE 1.5J.035X260 (WIRE) IMPLANT
INQWIRE 1.5J .035X260CM (WIRE) ×1
PACK CARDIAC CATHETERIZATION (CUSTOM PROCEDURE TRAY) ×1 IMPLANT
SET ATX-X65L (MISCELLANEOUS) IMPLANT

## 2022-09-17 NOTE — Discharge Instructions (Addendum)
Add Imdur 30 mg daily Drink plenty of fluids for 48 hours and keep wrist elevated at heart level for 24 hours  Radial Site Care   This sheet gives you information about how to care for yourself after your procedure. Your health care provider may also give you more specific instructions. If you have problems or questions, contact your health care provider. What can I expect after the procedure? After the procedure, it is common to have: Bruising and tenderness at the catheter insertion area. Follow these instructions at home: Medicines Take over-the-counter and prescription medicines only as told by your health care provider. Insertion site care Follow instructions from your health care provider about how to take care of your insertion site. Make sure you: Wash your hands with soap and water before you change your bandage (dressing). If soap and water are not available, use hand sanitizer. Remove your dressing AT 4:00 PM, 09/18/2022 Check your insertion site every day for signs of infection. Check for: Redness, swelling, or pain. Fluid or blood. Pus or a bad smell. Warmth. Do not take baths, swim, or use a hot tub until your health care provider approves. You may shower AFTER REMOVING THE DRESSING Pat the area dry with a clean towel. Do not rub the site. That could cause bleeding. Do not apply powder or lotion to the site. Activity   For 24 hours after the procedure, or as directed by your health care provider: Do not flex or bend the affected arm. Do not push or pull heavy objects with the affected arm. Do not drive yourself home from the hospital or clinic. You may drive 24 hours after the procedure unless your health care provider tells you not to. Do not operate machinery or power tools. Do not lift anything that is heavier than 10 lb (4.5 kg), or the limit that you are told, until your health care provider says that it is safe.  For 4 days Ask your health care provider when it  is okay to: Return to work or school. Resume usual physical activities or sports. Resume sexual activity. General instructions If the catheter site starts to bleed, raise your arm and put firm pressure on the site FOR 15 MINUTES. If the bleeding does not stop, get help right away. This is a medical emergency. If you went home on the same day as your procedure, a responsible adult should be with you for the first 24 hours after you arrive home. Keep all follow-up visits as told by your health care provider. This is important. Contact a health care provider if: You have a fever. You have redness, swelling, or yellow drainage around your insertion site. Get help right away if: You have unusual pain at the radial site. The catheter insertion area swells very fast APPLY FIRM PRESSURE FOR 15 MINUTES. IF SWELLING CONTINUES. CALL 911  The insertion area is bleeding, and the bleeding does not stop when you hold steady pressure on the area. Your arm or hand becomes pale, cool, tingly, or numb. These symptoms may represent a serious problem that is an emergency. Do not wait to see if the symptoms will go away. Get medical help right away. Call your local emergency services (911 in the [place] Do not drive yourself to the hospital. Summary After the procedure, it is common to have bruising and tenderness at the site. Follow instructions from your health care provider about how to take care of your radial site wound. Check the wound every day  for signs of infection. Do not lift anything that is heavier than 10 lb (4.5 kg), or the limit that you are told, until your health care provider says that it is safe. This information is not intended to replace advice given to you by your health care provider. Make sure you discuss any questions you have with your health care provider. Document Revised: 02/18/2017 Document Reviewed: 02/18/2017 Elsevier Patient Education  2020 [employer].

## 2022-09-17 NOTE — Interval H&P Note (Signed)
History and Physical Interval Note:  09/17/2022 11:56 AM  Sula Soda  has presented today for surgery, with the diagnosis of abnormal ct.  The various methods of treatment have been discussed with the patient and family. After consideration of risks, benefits and other options for treatment, the patient has consented to  Procedure(s): LEFT HEART CATH AND CORONARY ANGIOGRAPHY (N/A) as a surgical intervention.  The patient's history has been reviewed, patient examined, no change in status, stable for surgery.  I have reviewed the patient's chart and labs.  Questions were answered to the patient's satisfaction.    Cath Lab Visit (complete for each Cath Lab visit)  Clinical Evaluation Leading to the Procedure:   ACS: No.  Non-ACS:    Anginal Classification: CCS III  Anti-ischemic medical therapy: No Therapy  Non-Invasive Test Results: High-risk stress test findings: cardiac mortality >3%/year  Prior CABG: No previous CABG       Theron Arista [facility] 09/17/2022 11:56 AM

## 2022-09-18 ENCOUNTER — Encounter (HOSPITAL_COMMUNITY): Payer: Self-pay | Admitting: Cardiology

## 2022-09-29 NOTE — Progress Notes (Unsigned)
  Cardiology Office Note    Patient Name: David Ortega Date of Encounter: 09/29/2022  Primary Care Provider:  Bennie Pierini, FNP Primary Cardiologist:  Charlton Haws, MD Primary Electrophysiologist: None   Past Medical History    Past Medical History:  Diagnosis Date   Asthma    COPD (chronic obstructive pulmonary disease) (HCC)    Dyslipidemia    GERD (gastroesophageal reflux disease)    Hyperlipidemia    Hypertension    Kidney stones 11/2013   Precordial chest pain 07/02/2022   Myoview 07/07/22: EF 55, no ischemia, low risk    History of Present Illness  ENSAR AUNGST is a 62 y.o. male with a PMH of***   During today's visit the patient reports they are ***.  Patient denies chest pain, palpitations, dyspnea, PND, orthopnea, nausea, vomiting, dizziness, syncope, edema, weight gain, or early satiety.  ***Notes:  Review of Systems  Please see the history of present illness.    All other systems reviewed and are otherwise negative except as noted above.  Physical Exam    Wt Readings from Last 3 Encounters:  09/17/22 (!) 315 lb (142.9 kg)  09/15/22 (!) 312 lb (141.5 kg)  07/08/22 (!) 309 lb 12.8 oz (140.5 kg)   BT:DVVOH were no vitals filed for this visit.,There is no height or weight on file to calculate BMI. GEN: Well nourished, well developed in no acute distress Neck: No JVD; No carotid bruits Pulmonary: Clear to auscultation without rales, wheezing or rhonchi  Cardiovascular: Normal rate. Regular rhythm. Normal S1. Normal S2.   Murmurs: There is no murmur.  ABDOMEN: Soft, non-tender, non-distended EXTREMITIES:  No edema; No deformity   EKG/LABS/ Recent Cardiac Studies   ECG personally reviewed by me today - ***  Risk Assessment/Calculations:   {Does this patient have ATRIAL FIBRILLATION?[phone]}      Lab Results  Component Value Date   WBC 12.1 (H) 09/15/2022   HGB 15.4 09/15/2022   HCT 43.7 09/15/2022   MCV 97 09/15/2022   PLT 254  09/15/2022   Lab Results  Component Value Date   CREATININE 1.16 09/15/2022   BUN 15 09/15/2022   NA 137 09/15/2022   K 4.3 09/15/2022   CL 101 09/15/2022   CO2 21 09/15/2022   Lab Results  Component Value Date   CHOL 175 06/22/2022   HDL 51 06/22/2022   LDLCALC 85 06/22/2022   TRIG 195 (H) 06/22/2022   CHOLHDL 3.4 06/22/2022    Lab Results  Component Value Date   HGBA1C 6.7 (H) 06/22/2022   Assessment & Plan    1.***  2.***  3.***  4.***      Disposition: Follow-up with Charlton Haws, MD or APP in *** months {Are you ordering a CV Procedure (e.g. stress test, cath, DCCV, TEE, etc)?   Press F2        :[id number]}   Signed, Napoleon Form, Leodis Rains, NP 09/29/2022, 3:20 PM [facility]

## 2022-10-01 ENCOUNTER — Encounter: Payer: Self-pay | Admitting: Nurse Practitioner

## 2022-10-01 ENCOUNTER — Telehealth: Payer: Self-pay

## 2022-10-01 ENCOUNTER — Ambulatory Visit: Payer: Commercial Managed Care - PPO | Attending: Nurse Practitioner | Admitting: Nurse Practitioner

## 2022-10-01 VITALS — BP 128/80 | HR 65 | Ht 71.0 in | Wt 305.2 lb

## 2022-10-01 DIAGNOSIS — E782 Mixed hyperlipidemia: Secondary | ICD-10-CM | POA: Diagnosis not present

## 2022-10-01 DIAGNOSIS — I2510 Atherosclerotic heart disease of native coronary artery without angina pectoris: Secondary | ICD-10-CM | POA: Diagnosis not present

## 2022-10-01 DIAGNOSIS — Z6839 Body mass index (BMI) 39.0-39.9, adult: Secondary | ICD-10-CM

## 2022-10-01 DIAGNOSIS — R072 Precordial pain: Secondary | ICD-10-CM | POA: Diagnosis not present

## 2022-10-01 DIAGNOSIS — I10 Essential (primary) hypertension: Secondary | ICD-10-CM | POA: Diagnosis not present

## 2022-10-01 DIAGNOSIS — Z0000 Encounter for general adult medical examination without abnormal findings: Secondary | ICD-10-CM

## 2022-10-01 MED ORDER — ISOSORBIDE MONONITRATE ER 60 MG PO TB24: 60.0000 mg | ORAL_TABLET | Freq: Every day | ORAL | 3 refills | Status: DC

## 2022-10-01 MED ORDER — ATORVASTATIN CALCIUM 80 MG PO TABS
80.0000 mg | ORAL_TABLET | Freq: Every day | ORAL | 3 refills | Status: DC
Start: 2022-10-01 — End: 2022-12-08

## 2022-10-01 NOTE — Patient Instructions (Addendum)
Medication Instructions:  INCREASE Atorvastatin to 80mg  Take 1 tablet once a day  INCREASE Imdur to 60mg  Take 1 tablet once a day  *If you need a refill on your cardiac medications before your next appointment, please call your pharmacy*   Lab Work: 8 WEEKS FASTING LABS LFTs & LIPIDS (PT WILL COMPLETE IN [place] OR [place]) If you have labs (blood work) drawn today and your tests are completely normal, you will receive your results only by: MyChart Message (if you have MyChart) OR A paper copy in the mail If you have any lab test that is abnormal or we need to change your treatment, we will call you to review the results.   Testing/Procedures: NONE ORDERED   Follow-Up: At [facility], you and your health needs are our priority.  As part of our continuing mission to provide you with exceptional heart care, we have created designated Provider Care Teams.  These Care Teams include your primary Cardiologist (physician) and Advanced Practice Providers (APPs -  Physician Assistants and Nurse Practitioners) who all work together to provide you with the care you need, when you need it.  We recommend signing up for the patient portal called "MyChart".  Sign up information is provided on this After Visit Summary.  MyChart is used to connect with patients for Virtual Visits (Telemedicine).  Patients are able to view lab/test results, encounter notes, upcoming appointments, etc.  Non-urgent messages can be sent to your provider as well.   To learn more about what you can do with MyChart, go to [email or web address].    Your next appointment:   3 month(s)  Provider:   Charlton Haws, MD  or Robin Searing, NP   Other Instructions You have been referred to Care Guide, PharmD and pulmonology

## 2022-10-01 NOTE — Telephone Encounter (Signed)
Called patient to discuss health coaching per referral for smoking and weight loss. Patient stated that he has already quit smoking and is interesting in working on losing weight. Patient expressed interest in participating in health coaching. Patient has been scheduled for his initial session on September 6th at 4:15pm over the phone.    Renaee Munda, MS, ERHD, [facility]  Care Guide, Health & Wellness Coach [address] [place] [address] Telephone: [phone] Email: [email or web address]

## 2022-10-03 ENCOUNTER — Telehealth: Payer: Self-pay

## 2022-10-03 ENCOUNTER — Ambulatory Visit: Payer: Commercial Managed Care - PPO

## 2022-10-03 DIAGNOSIS — Z0000 Encounter for general adult medical examination without abnormal findings: Secondary | ICD-10-CM

## 2022-10-03 NOTE — Telephone Encounter (Signed)
Called patient to hold health coaching session. Changed to in-person visit on 10/10/2022 at 2:00pm due to scheduling. Patient will be seen for health coaching at [facility] in-person prior to his visit with pharmacy. Patient's appointment has been rescheduled as discussed.    Renaee Munda, MS, ERHD, [facility]  Care Guide, Health & Wellness Coach [address] [place] [address] Telephone: [phone] Email: [email or web address]

## 2022-10-10 ENCOUNTER — Ambulatory Visit: Payer: Commercial Managed Care - PPO

## 2022-10-10 ENCOUNTER — Encounter: Payer: Self-pay | Admitting: Pharmacist Clinician (PhC)/ Clinical Pharmacy Specialist

## 2022-10-10 ENCOUNTER — Telehealth: Payer: Self-pay | Admitting: Pharmacist Clinician (PhC)/ Clinical Pharmacy Specialist

## 2022-10-10 ENCOUNTER — Ambulatory Visit
Payer: Commercial Managed Care - PPO | Attending: Cardiology | Admitting: Pharmacist Clinician (PhC)/ Clinical Pharmacy Specialist

## 2022-10-10 ENCOUNTER — Other Ambulatory Visit (HOSPITAL_COMMUNITY): Payer: Self-pay

## 2022-10-10 VITALS — Ht 71.0 in | Wt 303.0 lb

## 2022-10-10 NOTE — Progress Notes (Signed)
Office Visit    Patient Name: David Ortega Date of Encounter: 10/10/2022  Primary Care Provider:  Bennie Pierini, FNP Primary Cardiologist:  Charlton Haws, MD  Chief Complaint    Weight management  Significant Past Medical History   CAD 8/21 cath - 70% mRCA, 90% pLAD lesions, with several others at 50%, medical therapy recommended  DM2 Recently diagnosed, A1c 6.7  HTN Controlled with losartan   HLD 5/24 LDL 85; atorvastatin increased to 80, ezetimibe 10  HFpEF Euvolemic at last visit, working hard to restrict sodium    Allergies  Allergen Reactions   Propranolol Shortness Of Breath   Lisinopril Cough   Simvastatin     Leg pain    History of Present Illness    David Ortega is a 62 y.o. male patient of Dr Eden Emms, in the office today to talk about options for weight management.  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  He does note that his wife makes his meals for when he's on the road and he has not been eating truck stop/diner foods.  Three weeks ago he gave up salty snacks (mostly chips) as well as cigarettes.  His wife did the same and they are supporting each other in these lifestyle changes.  She is currently on Ozempic and is now losing weight.    Current weight management medications: none  Previously tried meds: Weight watchers, had trouble with following   Current meds that may affect weight:  none  Baseline weight/BMI:  137.4 kg // 42.28  Insurance payor: [employer]   Pharmacy: [facility], [place]  Diet: eats out some, occasional fast food but more sit down, grilled foods and vegetables; wife recently started [employer], they are working on dietary changes together  Exercise: no, in need of knee replacements, has to lose at least 40 lbs for MD to consider  Family History: father had MI and died in his 14's; mother had stroke, pericardiectomy(?) in 66;  one of 10  siblings (oldest brother had MI at 31, died later), no other significant cardiac disease2 children healthy  Confirmed patient has no personal or family history of medullary thyroid carcinoma (MTC) or Multiple Endocrine Neoplasia syndrome type 2 (MEN 2).   Social History:   Tobacco: quit 3 weeks ago  Alcohol:  very rare  Caffeine: coffee - about 2 cups per day   Accessory Clinical Findings    Lab Results  Component Value Date   CREATININE 1.16 09/15/2022   BUN 15 09/15/2022   NA 137 09/15/2022   K 4.3 09/15/2022   CL 101 09/15/2022   CO2 21 09/15/2022   Lab Results  Component Value Date   ALT 28 06/19/2022   AST 22 06/19/2022   ALKPHOS 134 (H) 06/19/2022   BILITOT 1.0 06/19/2022   Lab Results  Component Value Date   HGBA1C 6.7 (H) 06/22/2022      Home Medications/Allergies    Current Outpatient Medications  Medication Sig Dispense Refill   albuterol (VENTOLIN HFA) 108 (90 Base) MCG/ACT inhaler INHALE 2 PUFFS BY MOUTH EVERY 6 HOURS AS NEEDED FOR WHEEZING AND SHORTNESS OF BREATH . APPOINTMENT REQUIRED FOR FUTURE REFILLS 9 g 4   aspirin EC 81 MG tablet Take 81 mg by mouth daily. Swallow whole.     atorvastatin (LIPITOR) 80 MG tablet Take 1 tablet (80 mg total) by mouth daily. 30 tablet 3   budesonide-formoterol (  SYMBICORT) 160-4.5 MCG/ACT inhaler Inhale 2 puffs into the lungs 2 (two) times daily. 33 g 3   buPROPion (WELLBUTRIN XL) 300 MG 24 hr tablet Take 1 tablet (300 mg total) by mouth daily. 90 tablet 1   busPIRone (BUSPAR) 15 MG tablet Take 1 tablet (15 mg total) by mouth 2 (two) times daily. 60 tablet 2   escitalopram (LEXAPRO) 20 MG tablet Take 1 tablet (20 mg total) by mouth daily. (Patient taking differently: Take 20 mg by mouth at bedtime.) 90 tablet 1   ezetimibe (ZETIA) 10 MG tablet Take 1 tablet (10 mg total) by mouth at bedtime. (Patient taking differently: Take 10 mg by mouth daily.) 90 tablet 1   furosemide (LASIX) 20 MG tablet Take 1 tablet (20 mg total) by  mouth daily as needed for edema. 90 tablet 1   isosorbide mononitrate (IMDUR) 60 MG 24 hr tablet Take 1 tablet (60 mg total) by mouth daily. 30 tablet 3   losartan (COZAAR) 100 MG tablet Take 100 mg by mouth daily. Pt takes 1 tablet by mouth once a day.     meclizine (ANTIVERT) 25 MG tablet TAKE 1 TABLET BY MOUTH THREE TIMES DAILY AS NEEDED FOR  DIZZINESS 30 tablet 0   meloxicam (MOBIC) 15 MG tablet Take 1 tablet by mouth once daily 30 tablet 0   mupirocin ointment (BACTROBAN) 2 % Apply 1 application. topically 2 (two) times daily. (Patient taking differently: Apply 1 application  topically 2 (two) times daily as needed (wound care).) 22 g 0   nitroGLYCERIN (NITROSTAT) 0.4 MG SL tablet Place 1 tablet (0.4 mg total) under the tongue every 5 (five) minutes x 3 doses as needed for chest pain. 25 tablet 12   omeprazole (PRILOSEC) 40 MG capsule Take 40 mg by mouth daily.     triamcinolone cream (KENALOG) 0.1 % APPLY ONE APPLICATION TWICE DAILY (Patient taking differently: Apply 1 Application topically 2 (two) times daily as needed (irritation).) 453.6 g 1   No current facility-administered medications for this visit.     Allergies  Allergen Reactions   Propranolol Shortness Of Breath   Lisinopril Cough   Simvastatin     Leg pain    Assessment & Plan    Obesity, Class III, BMI 40-49.9 (morbid obesity) (HCC)  Patient has not met goal of at least 5% of body weight loss with comprehensive lifestyle modifications alone in the past 3-6 months. Pharmacotherapy is appropriate to pursue as augmentation. Will start Mounjaro.   Confirmed patient has no personal or family history of medullary thyroid carcinoma (MTC) or Multiple Endocrine Neoplasia syndrome type 2 (MEN 2).   Advised patient on common side effects including nausea, diarrhea, dyspepsia, decreased appetite, and fatigue. Counseled patient on reducing meal size and how to titrate medication to minimize side effects. Patient aware to call if  intolerable side effects or if experiencing dehydration, abdominal pain, or dizziness. Patient will adhere to dietary modifications and will target at least 150 minutes of moderate intensity exercise weekly.   Injection technique reviewed at today's visit.    Titration Plan:  Will plan to follow the titration plan as below, pending patient is tolerating each dose before increasing to the next. Can slow titration if needed for tolerability.    -Month 1: Inject 2.5 mg SQ once weekly x 4 weeks -Month 2: Inject 5 mg SQ once weekly x 4 weeks -Month 3: Inject 7.5 mg SQ once weekly x 4 weeks -Month 4+: Inject 10 mg SQ once  weekly   Gave 1 refill on each dose should he need slower titration schedule  Follow up in 3 months.   Phillips Hay PharmD CPP [facility]  [address] [place], [place] [address] [phone]

## 2022-10-10 NOTE — Patient Instructions (Signed)
We will start the prior authorization process to get [facility] covered by your insurance.   TIPS FOR SUCCESS Write down the reasons why you want to lose weight and post it in a place where you'll see it often. Start small and work your way up. Keep in mind that it takes time to achieve goals, and small steps add up. Any additional movements help to burn calories. Taking the stairs rather than the elevator and parking at the far end of your parking lot are easy ways to start. Brisk walking for at least 30 minutes 4 or more days of the week is an excellent goal to work toward  [employer] WHAT IT MEANS TO FEEL FULL Did you know that it can take 15 minutes or more for your brain to receive the message that you've eaten? That means that, if you eat less food, but consume it slower, you may still feel satisfied. Eating a lot of fruits and vegetables can also help you feel fuller. Eat off of smaller plates so that moderate portions don't seem too small  TITRATION PLAN Will plan to follow the titration plan as below, pending patient is tolerating each dose before increasing to the next. Can slow titration if needed for tolerability.    -Weeks 1-4: Inject 2.5  mg SQ once weekly  -Weeks 5-8: Inject 5. mg SQ once weekly  -Weeks 9-12 Inject 7.5 mg SQ once weekly  -Weeks 13-16: Inject 10 SQ once weekly   Follow up in 3 months - I will reach out with appointment information after we get the medication approved.  If you have any questions or concerns, please reach out to [place].  My Chart is the best way to reach [place].   THANK YOU FOR CHOOSING [facility]

## 2022-10-10 NOTE — Telephone Encounter (Signed)
Please do PA for [facility] (if needed).  Newly diagnosed DM 5/24

## 2022-10-10 NOTE — Assessment & Plan Note (Signed)
  Patient has not met goal of at least 5% of body weight loss with comprehensive lifestyle modifications alone in the past 3-6 months. Pharmacotherapy is appropriate to pursue as augmentation. Will start Mounjaro.   Confirmed patient has no personal or family history of medullary thyroid carcinoma (MTC) or Multiple Endocrine Neoplasia syndrome type 2 (MEN 2).   Advised patient on common side effects including nausea, diarrhea, dyspepsia, decreased appetite, and fatigue. Counseled patient on reducing meal size and how to titrate medication to minimize side effects. Patient aware to call if intolerable side effects or if experiencing dehydration, abdominal pain, or dizziness. Patient will adhere to dietary modifications and will target at least 150 minutes of moderate intensity exercise weekly.   Injection technique reviewed at today's visit.    Titration Plan:  Will plan to follow the titration plan as below, pending patient is tolerating each dose before increasing to the next. Can slow titration if needed for tolerability.    -Month 1: Inject 2.5 mg SQ once weekly x 4 weeks -Month 2: Inject 5 mg SQ once weekly x 4 weeks -Month 3: Inject 7.5 mg SQ once weekly x 4 weeks -Month 4+: Inject 10 mg SQ once weekly   Gave 1 refill on each dose should he need slower titration schedule  Follow up in 3 months.

## 2022-10-13 ENCOUNTER — Telehealth: Payer: Self-pay | Admitting: Pharmacy Technician

## 2022-10-13 ENCOUNTER — Other Ambulatory Visit (HOSPITAL_COMMUNITY): Payer: Self-pay

## 2022-10-13 MED ORDER — MOUNJARO 5 MG/0.5ML ~~LOC~~ SOAJ: 5.0000 mg | SUBCUTANEOUS | 1 refills | Status: DC

## 2022-10-13 MED ORDER — MOUNJARO 2.5 MG/0.5ML ~~LOC~~ SOAJ: 2.5000 mg | SUBCUTANEOUS | 1 refills | Status: DC

## 2022-10-13 MED ORDER — MOUNJARO 7.5 MG/0.5ML ~~LOC~~ SOAJ: 7.5000 mg | SUBCUTANEOUS | 1 refills | Status: DC

## 2022-10-13 NOTE — Telephone Encounter (Signed)
PA request has been Submitted. New Encounter created for follow up. For additional info see Pharmacy Prior Auth telephone encounter from 10/13/22.

## 2022-10-13 NOTE — Telephone Encounter (Signed)
Pharmacy Patient Advocate Encounter  Received notification from [facility] that Prior Authorization for mounjaro has been APPROVED from 10/13/22 to 10/13/23. Ran test claim, Copay is $108.07. This test claim was processed through [facility]- copay amounts may vary at other pharmacies due to pharmacy/plan contracts, or as the patient moves through the different stages of their insurance plan.   PA #/Case ID/Reference #: [id number]

## 2022-10-13 NOTE — Telephone Encounter (Signed)
Pharmacy Patient Advocate Encounter   Received notification from Pt Calls Messages that prior authorization for mounjaro is required/requested.   Insurance verification completed.   The patient is insured through [facility] .   Per test claim: PA required; PA started via CoverMyMeds. KEY [id number] . Waiting for clinical questions to populate.

## 2022-10-13 NOTE — Telephone Encounter (Signed)
Pharmacy Patient Advocate Encounter   Received notification from Pt Calls Messages that prior authorization for mounjaro is required/requested.   Insurance verification completed.   The patient is insured through [facility] .   Per test claim: PA required; PA submitted to [facility] via CoverMyMeds Key/confirmation #/EOC [id number] Status is pending

## 2022-10-13 NOTE — Addendum Note (Signed)
Addended by: Rosalee Kaufman on: 10/13/2022 12:48 PM   Modules accepted: Orders

## 2022-10-22 ENCOUNTER — Telehealth: Payer: Self-pay

## 2022-10-22 DIAGNOSIS — Z0000 Encounter for general adult medical examination without abnormal findings: Secondary | ICD-10-CM

## 2022-10-22 NOTE — Telephone Encounter (Signed)
Called patient to reschedule health coaching appointment. Patient did not answer. Left message for patient to return call.    Renaee Munda, MS, ERHD, [facility]  Care Guide, Health & Wellness Coach [address], [address] [place] [address] Telephone: [phone] Email: [email or web address]

## 2022-10-30 ENCOUNTER — Encounter: Payer: Self-pay | Admitting: Pharmacist Clinician (PhC)/ Clinical Pharmacy Specialist

## 2022-11-05 ENCOUNTER — Other Ambulatory Visit: Payer: Self-pay | Admitting: Nurse Practitioner

## 2022-11-05 ENCOUNTER — Telehealth: Payer: Self-pay

## 2022-11-05 DIAGNOSIS — Z0000 Encounter for general adult medical examination without abnormal findings: Secondary | ICD-10-CM

## 2022-11-05 NOTE — Telephone Encounter (Signed)
Called patient to follow up regarding health coaching. Patient did not answer. Left message to call back to provide update with interest in participating in the program.    Renaee Munda, MS, ERHD, [facility]  Care Guide, Health & Wellness Coach [address] [place] [address] Telephone: [phone] Email: [email or web address]

## 2022-11-21 ENCOUNTER — Ambulatory Visit: Payer: Commercial Managed Care - PPO | Admitting: Nurse Practitioner

## 2022-11-27 ENCOUNTER — Ambulatory Visit: Payer: Commercial Managed Care - PPO | Admitting: Nurse Practitioner

## 2022-12-08 ENCOUNTER — Encounter: Payer: Self-pay | Admitting: Nurse Practitioner

## 2022-12-08 ENCOUNTER — Ambulatory Visit (INDEPENDENT_AMBULATORY_CARE_PROVIDER_SITE_OTHER): Payer: Commercial Managed Care - PPO | Admitting: Nurse Practitioner

## 2022-12-08 VITALS — BP 133/84 | HR 79 | Temp 98.2°F | Resp 20 | Ht 71.0 in | Wt 279.0 lb

## 2022-12-08 DIAGNOSIS — R0789 Other chest pain: Secondary | ICD-10-CM | POA: Diagnosis not present

## 2022-12-08 DIAGNOSIS — G4733 Obstructive sleep apnea (adult) (pediatric): Secondary | ICD-10-CM | POA: Diagnosis not present

## 2022-12-08 DIAGNOSIS — I10 Essential (primary) hypertension: Secondary | ICD-10-CM | POA: Diagnosis not present

## 2022-12-08 DIAGNOSIS — J4520 Mild intermittent asthma, uncomplicated: Secondary | ICD-10-CM

## 2022-12-08 DIAGNOSIS — Z6839 Body mass index (BMI) 39.0-39.9, adult: Secondary | ICD-10-CM

## 2022-12-08 DIAGNOSIS — K219 Gastro-esophageal reflux disease without esophagitis: Secondary | ICD-10-CM | POA: Diagnosis not present

## 2022-12-08 DIAGNOSIS — F3341 Major depressive disorder, recurrent, in partial remission: Secondary | ICD-10-CM

## 2022-12-08 DIAGNOSIS — E782 Mixed hyperlipidemia: Secondary | ICD-10-CM | POA: Diagnosis not present

## 2022-12-08 DIAGNOSIS — E119 Type 2 diabetes mellitus without complications: Secondary | ICD-10-CM

## 2022-12-08 DIAGNOSIS — R739 Hyperglycemia, unspecified: Secondary | ICD-10-CM

## 2022-12-08 LAB — BAYER DCA HB A1C WAIVED: HB A1C (BAYER DCA - WAIVED): 5.9 % — ABNORMAL HIGH (ref 4.8–5.6)

## 2022-12-08 MED ORDER — BUDESONIDE-FORMOTEROL FUMARATE 160-4.5 MCG/ACT IN AERO
2.0000 | INHALATION_SPRAY | Freq: Two times a day (BID) | RESPIRATORY_TRACT | 3 refills | Status: DC
Start: 2022-12-08 — End: 2022-12-09

## 2022-12-08 MED ORDER — FUROSEMIDE 20 MG PO TABS
20.0000 mg | ORAL_TABLET | Freq: Every day | ORAL | 1 refills | Status: DC | PRN
Start: 2022-12-08 — End: 2023-03-31

## 2022-12-08 MED ORDER — ESCITALOPRAM OXALATE 20 MG PO TABS: 20.0000 mg | ORAL_TABLET | Freq: Every day | ORAL | 1 refills | Status: DC

## 2022-12-08 MED ORDER — MIDODRINE HCL 5 MG PO TABS: 5.0000 mg | ORAL_TABLET | Freq: Every day | ORAL | 1 refills | Status: AC | PRN

## 2022-12-08 MED ORDER — LOSARTAN POTASSIUM 100 MG PO TABS: 100.0000 mg | ORAL_TABLET | Freq: Every day | ORAL | 1 refills | Status: DC

## 2022-12-08 MED ORDER — ATORVASTATIN CALCIUM 80 MG PO TABS
80.0000 mg | ORAL_TABLET | Freq: Every day | ORAL | 1 refills | Status: DC
Start: 2022-12-08 — End: 2023-03-31

## 2022-12-08 MED ORDER — EZETIMIBE 10 MG PO TABS: 10.0000 mg | ORAL_TABLET | Freq: Every day | ORAL | 1 refills | Status: DC

## 2022-12-08 MED ORDER — ISOSORBIDE MONONITRATE ER 60 MG PO TB24: 60.0000 mg | ORAL_TABLET | Freq: Every day | ORAL | 1 refills | Status: DC

## 2022-12-08 MED ORDER — BUPROPION HCL ER (XL) 300 MG PO TB24: 300.0000 mg | ORAL_TABLET | Freq: Every day | ORAL | 1 refills | Status: DC

## 2022-12-08 MED ORDER — BUSPIRONE HCL 15 MG PO TABS: 15.0000 mg | ORAL_TABLET | Freq: Two times a day (BID) | ORAL | 2 refills | Status: DC

## 2022-12-08 MED ORDER — OMEPRAZOLE 40 MG PO CPDR: 40.0000 mg | DELAYED_RELEASE_CAPSULE | Freq: Every day | ORAL | 1 refills | Status: DC

## 2022-12-08 NOTE — Patient Instructions (Signed)
Chest Wall Pain Chest wall pain is pain in or around the bones and muscles of your chest. Chest wall pain may be caused by: An injury. Coughing a lot. Using your chest and arm muscles too much. Sometimes, the cause may not be known. This pain may take a few [place] or longer to get better. Follow these instructions at home: Managing pain, stiffness, and swelling If told, put ice on the painful area: Put ice in a plastic bag. Place a towel between your skin and the bag. Leave the ice on for 20 minutes, 2-3 times a day.  Activity Rest as told by your doctor. Avoid doing things that cause pain. This includes lifting heavy items. Ask your doctor what activities are safe for you. General instructions  Take over-the-counter and prescription medicines only as told by your doctor. Do not use any products that contain nicotine or tobacco, such as cigarettes, e-cigarettes, and chewing tobacco. If you need help quitting, ask your doctor. Keep all follow-up visits as told by your doctor. This is important. Contact a doctor if: You have a fever. Your chest pain gets worse. You have new symptoms. Get help right away if: You feel sick to your stomach (nauseous) or you throw up (vomit). You feel sweaty or light-headed. You have a cough with mucus from your lungs (sputum) or you cough up blood. You are short of breath. These symptoms may be an emergency. Do not wait to see if the symptoms will go away. Get medical help right away. Call your local emergency services (911 in the [place] Do not drive yourself to the hospital. Summary Chest wall pain is pain in or around the bones and muscles of your chest. It may be treated with ice, rest, and medicines. Your condition may also get better if you avoid doing things that cause pain. Contact a doctor if you have a fever, chest pain that gets worse, or new symptoms. Get help right away if you feel light-headed or you get short of breath. These symptoms may  be an emergency. This information is not intended to replace advice given to you by your health care provider. Make sure you discuss any questions you have with your health care provider. Document Revised: 01/06/2022 Document Reviewed: 01/06/2022 Elsevier Patient Education  2024 [employer].

## 2022-12-08 NOTE — Progress Notes (Signed)
Subjective:    Patient ID: David Ortega, male    DOB: Mar 17, 1960, 62 y.o.   MRN: [id number]   Chief Complaint: medical management of chronic issues     HPI:  David Ortega is a 62 y.o. who identifies as a male who was assigned male at birth.   Social history: Lives with: wife Work history: works on trucks   Comes in today for follow up of the following chronic medical issues:  1. Primary hypertension No c/o  sob or headache. Does not check blood pressure at home. BP Readings from Last 3 Encounters:  10/01/22 128/80  09/17/22 (!) 129/59  09/15/22 130/82     2. Mixed hyperlipidemia Does not watch diet and does no exercise at all Lab Results  Component Value Date   CHOL 175 06/22/2022   HDL 51 06/22/2022   LDLCALC 85 06/22/2022   TRIG 195 (H) 06/22/2022   CHOLHDL 3.4 06/22/2022     3. Gastroesophageal reflux disease without esophagitis Is on omeprazole daily and is doing well  4. OSA (obstructive sleep apnea) Wears cpap nightly  5. Recurrent major depressive disorder, in partial remission (HCC) Is on wellbutrin, lexapro and buspar daily. Is doing well most of he time    12/08/2022    3:06 PM 06/19/2022    8:39 AM 05/15/2022    3:20 PM  Depression screen PHQ 2/9  Decreased Interest 1 1 2   Down, Depressed, Hopeless 0 0 0  PHQ - 2 Score 1 1 2   Altered sleeping 1 1 3   Tired, decreased energy 1 1 1   Change in appetite 0 0 0  Feeling bad or failure about yourself  0 0 0  Trouble concentrating 0 0 0  Moving slowly or fidgety/restless 0 0 0  Suicidal thoughts 0 0 0  PHQ-9 Score 3 3 6   Difficult doing work/chores Somewhat difficult Not difficult at all Somewhat difficult      12/08/2022    3:06 PM 06/19/2022    8:39 AM 05/15/2022    3:21 PM 12/23/2021    4:32 PM  GAD 7 : Generalized Anxiety Score  Nervous, Anxious, on Edge 0 1 2 0  Control/stop worrying 1 1 1  0  Worry too much - different things 1 1 2  0  Trouble relaxing 0 0 2 0  Restless 0 0 2  0  Easily annoyed or irritable 0 0 0 0  Afraid - awful might happen 0 0 0 0  Total GAD 7 Score 2 3 9  0  Anxiety Difficulty Somewhat difficult Somewhat difficult Somewhat difficult Not difficult at all      6. BMI 39.0-39.9,adult Weight is down 24lbs. He is on [employer] Readings from Last 3 Encounters:  12/08/22 279 lb (126.6 kg)  10/10/22 (!) 303 lb (137.4 kg)  10/01/22 (!) 305 lb 3.2 oz (138.4 kg)   BMI Readings from Last 3 Encounters:  12/08/22 38.91 kg/m  10/10/22 42.26 kg/m  10/01/22 42.57 kg/m      Had chest pain this morning and took 2 mitro- pain was a pressure feeling. No sob or sweating. No arm or neck pain.  Allergies  Allergen Reactions   Propranolol Shortness Of Breath   Lisinopril Cough   Simvastatin     Leg pain   Outpatient Encounter Medications as of 12/08/2022  Medication Sig   albuterol (VENTOLIN HFA) 108 (90 Base) MCG/ACT inhaler INHALE 2 PUFFS BY MOUTH EVERY 6 HOURS AS NEEDED FOR WHEEZING AND SHORTNESS OF  BREATH (APPOINTMENT REQUIRED FOR FUTURE REFILLS)   aspirin EC 81 MG tablet Take 81 mg by mouth daily. Swallow whole.   atorvastatin (LIPITOR) 80 MG tablet Take 1 tablet (80 mg total) by mouth daily.   budesonide-formoterol (SYMBICORT) 160-4.5 MCG/ACT inhaler Inhale 2 puffs into the lungs 2 (two) times daily.   buPROPion (WELLBUTRIN XL) 300 MG 24 hr tablet Take 1 tablet (300 mg total) by mouth daily.   busPIRone (BUSPAR) 15 MG tablet Take 1 tablet (15 mg total) by mouth 2 (two) times daily.   escitalopram (LEXAPRO) 20 MG tablet Take 1 tablet (20 mg total) by mouth daily. (Patient taking differently: Take 20 mg by mouth at bedtime.)   ezetimibe (ZETIA) 10 MG tablet Take 1 tablet (10 mg total) by mouth at bedtime. (Patient taking differently: Take 10 mg by mouth daily.)   furosemide (LASIX) 20 MG tablet Take 1 tablet (20 mg total) by mouth daily as needed for edema.   isosorbide mononitrate (IMDUR) 60 MG 24 hr tablet Take 1 tablet (60 mg total) by  mouth daily.   losartan (COZAAR) 100 MG tablet Take 100 mg by mouth daily. Pt takes 1 tablet by mouth once a day.   meclizine (ANTIVERT) 25 MG tablet TAKE 1 TABLET BY MOUTH THREE TIMES DAILY AS NEEDED FOR  DIZZINESS   meloxicam (MOBIC) 15 MG tablet Take 1 tablet by mouth once daily   mupirocin ointment (BACTROBAN) 2 % Apply 1 application. topically 2 (two) times daily. (Patient taking differently: Apply 1 application  topically 2 (two) times daily as needed (wound care).)   nitroGLYCERIN (NITROSTAT) 0.4 MG SL tablet Place 1 tablet (0.4 mg total) under the tongue every 5 (five) minutes x 3 doses as needed for chest pain.   omeprazole (PRILOSEC) 40 MG capsule Take 40 mg by mouth daily.   tirzepatide [facility]) 2.5 MG/0.5ML Pen Inject 2.5 mg into the skin once a week.   tirzepatide [facility]) 5 MG/0.5ML Pen Inject 5 mg into the skin once a week.   tirzepatide (MOUNJARO) 7.5 MG/0.5ML Pen Inject 7.5 mg into the skin once a week.   triamcinolone cream (KENALOG) 0.1 % APPLY ONE APPLICATION TWICE DAILY (Patient taking differently: Apply 1 Application topically 2 (two) times daily as needed (irritation).)   No facility-administered encounter medications on file as of 12/08/2022.    Past Surgical History:  Procedure Laterality Date   BACK SURGERY     CHOLECYSTECTOMY     COLONOSCOPY N/A 09/05/2016   Procedure: COLONOSCOPY;  Surgeon: Corbin Ade, MD;  Location: [facility];  Service: Endoscopy;  Laterality: N/A;  2:15 PM   ESOPHAGOGASTRODUODENOSCOPY     LEFT ANKLE SURGERY     LEFT HEART CATH AND CORONARY ANGIOGRAPHY N/A 09/17/2022   Procedure: LEFT HEART CATH AND CORONARY ANGIOGRAPHY;  Surgeon: Swaziland, Peter M, MD;  Location: [facility] INVASIVE CV LAB;  Service: Cardiovascular;  Laterality: N/A;    Family History  Problem Relation Age of Onset   Diabetes Mother    Heart disease Mother    Diabetes Sister    Cancer Brother    Cancer Sister        pancreatic      Controlled substance contract:  n/a     Review of Systems  Constitutional:  Negative for diaphoresis.  Eyes:  Negative for pain.  Respiratory:  Negative for shortness of breath.   Cardiovascular:  Negative for chest pain, palpitations and leg swelling.  Gastrointestinal:  Negative for abdominal pain.  Endocrine: Negative  for polydipsia.  Skin:  Negative for rash.  Neurological:  Negative for dizziness, weakness and headaches.  Hematological:  Does not bruise/bleed easily.  All other systems reviewed and are negative.      Objective:   Physical Exam Vitals and nursing note reviewed.  Constitutional:      Appearance: Normal appearance. He is well-developed.  HENT:     Head: Normocephalic.     Nose: Nose normal.     Mouth/Throat:     Mouth: Mucous membranes are moist.     Pharynx: Oropharynx is clear.  Eyes:     Pupils: Pupils are equal, round, and reactive to light.  Neck:     Thyroid: No thyroid mass or thyromegaly.     Vascular: No carotid bruit or JVD.     Trachea: Phonation normal.  Cardiovascular:     Rate and Rhythm: Normal rate and regular rhythm.  Pulmonary:     Effort: Pulmonary effort is normal. No respiratory distress.     Breath sounds: Normal breath sounds.  Abdominal:     General: Bowel sounds are normal.     Palpations: Abdomen is soft.     Tenderness: There is no abdominal tenderness.  Musculoskeletal:        General: Normal range of motion.     Cervical back: Normal range of motion and neck supple.  Lymphadenopathy:     Cervical: No cervical adenopathy.  Skin:    General: Skin is warm and dry.  Neurological:     Mental Status: He is alert and oriented to person, place, and time.  Psychiatric:        Behavior: Behavior normal.        Thought Content: Thought content normal.        Judgment: Judgment normal.    EKG- NSR-Mary-Margaret Daphine Deutscher, FNP  BP 133/84   Pulse 79   Temp 98.2 F (36.8 C) (Temporal)   Resp 20   Ht 5\' 11"  (1.803 m)   Wt 279 lb (126.6 kg)   SpO2 96%    BMI 38.91 kg/m   Hgba1c 5.9%     Assessment & Plan:  David Ortega comes in today with chief complaint of No chief complaint on file.   Diagnosis and orders addressed:  1. Primary hypertension Low sodium diet - CBC with Differential/Platelet - CMP14+EGFR - furosemide (LASIX) 20 MG tablet; Take 1 tablet (20 mg total) by mouth daily as needed for edema.  Dispense: 90 tablet; Refill: 1  2. Mixed hyperlipidemia Low fat diet - Lipid panel - atorvastatin (LIPITOR) 80 MG tablet; Take 1 tablet (80 mg total) by mouth daily.  Dispense: 90 tablet; Refill: 1  3. Gastroesophageal reflux disease without esophagitis Avoid spicy foods Do not eat 2 hours prior to bedtime   4. OSA (obstructive sleep apnea) Wear CPAP  5. Recurrent major depressive disorder, in partial remission (HCC) Stress management - buPROPion (WELLBUTRIN XL) 300 MG 24 hr tablet; Take 1 tablet (300 mg total) by mouth daily.  Dispense: 90 tablet; Refill: 1 - busPIRone (BUSPAR) 15 MG tablet; Take 1 tablet (15 mg total) by mouth 2 (two) times daily.  Dispense: 60 tablet; Refill: 2 - escitalopram (LEXAPRO) 20 MG tablet; Take 1 tablet (20 mg total) by mouth daily.  Dispense: 90 tablet; Refill: 1  6. BMI 39.0-39.9,adult Discussed diet and exercise for person with BMI >25 Will recheck weight in 3-6 months   7. Elevated blood sugar - Bayer DCA Hb A1c Waived  8.  Diet-controlled diabetes mellitus (HCC) On  mounjario  9. Other chest pain - EKG 12-Lead  10. Asthma, chronic, mild intermittent, uncomplicated - budesonide-formoterol (SYMBICORT) 160-4.5 MCG/ACT inhaler; Inhale 2 puffs into the lungs 2 (two) times daily.  Dispense: 33 g; Refill: 3   Labs pending Health Maintenance reviewed Diet and exercise encouraged  Follow up plan: 3 months   Mary-Margaret Daphine Deutscher, FNP

## 2022-12-08 NOTE — Addendum Note (Signed)
Addended by: Bennie Pierini on: 12/08/2022 03:42 PM   Modules accepted: Orders

## 2022-12-09 ENCOUNTER — Other Ambulatory Visit: Payer: Self-pay | Admitting: Nurse Practitioner

## 2022-12-09 ENCOUNTER — Other Ambulatory Visit: Payer: Self-pay | Admitting: Cardiovascular Disease

## 2022-12-09 DIAGNOSIS — J4520 Mild intermittent asthma, uncomplicated: Secondary | ICD-10-CM

## 2022-12-09 LAB — CBC WITH DIFFERENTIAL/PLATELET
Basophils Absolute: 0 10*3/uL (ref 0.0–0.2)
Basos: 0 %
EOS (ABSOLUTE): 0.2 10*3/uL (ref 0.0–0.4)
Eos: 3 %
Hematocrit: 42.3 % (ref 37.5–51.0)
Hemoglobin: 14.3 g/dL (ref 13.0–17.7)
Immature Grans (Abs): 0 10*3/uL (ref 0.0–0.1)
Immature Granulocytes: 1 %
Lymphocytes Absolute: 2.4 10*3/uL (ref 0.7–3.1)
Lymphs: 29 %
MCH: 33 pg (ref 26.6–33.0)
MCHC: 33.8 g/dL (ref 31.5–35.7)
MCV: 98 fL — ABNORMAL HIGH (ref 79–97)
Monocytes Absolute: 0.6 10*3/uL (ref 0.1–0.9)
Monocytes: 8 %
Neutrophils Absolute: 5.1 10*3/uL (ref 1.4–7.0)
Neutrophils: 59 %
Platelets: 198 10*3/uL (ref 150–450)
RBC: 4.33 x10E6/uL (ref 4.14–5.80)
RDW: 12.7 % (ref 11.6–15.4)
WBC: 8.5 10*3/uL (ref 3.4–10.8)

## 2022-12-09 LAB — CMP14+EGFR
ALT: 11 [IU]/L (ref 0–44)
AST: 17 [IU]/L (ref 0–40)
Albumin: 3.9 g/dL (ref 3.9–4.9)
Alkaline Phosphatase: 152 [IU]/L — ABNORMAL HIGH (ref 44–121)
BUN/Creatinine Ratio: 9 — ABNORMAL LOW (ref 10–24)
BUN: 11 mg/dL (ref 8–27)
Bilirubin Total: 0.8 mg/dL (ref 0.0–1.2)
CO2: 23 mmol/L (ref 20–29)
Calcium: 9.3 mg/dL (ref 8.6–10.2)
Chloride: 104 mmol/L (ref 96–106)
Creatinine, Ser: 1.17 mg/dL (ref 0.76–1.27)
Globulin, Total: 2.7 g/dL (ref 1.5–4.5)
Glucose: 95 mg/dL (ref 70–99)
Potassium: 4.8 mmol/L (ref 3.5–5.2)
Sodium: 142 mmol/L (ref 134–144)
Total Protein: 6.6 g/dL (ref 6.0–8.5)
eGFR: 70 mL/min/{1.73_m2} (ref >59)

## 2022-12-09 LAB — LIPID PANEL
Chol/HDL Ratio: 2 ratio (ref 0.0–5.0)
Cholesterol, Total: 108 mg/dL (ref 100–199)
HDL: 53 mg/dL (ref >39)
LDL Chol Calc (NIH): 37 mg/dL (ref 0–99)
Triglycerides: 97 mg/dL (ref 0–149)
VLDL Cholesterol Cal: 18 mg/dL (ref 5–40)

## 2022-12-09 MED ORDER — BUDESONIDE-FORMOTEROL FUMARATE 160-4.5 MCG/ACT IN AERO: 2.0000 | INHALATION_SPRAY | Freq: Two times a day (BID) | RESPIRATORY_TRACT | 3 refills | Status: DC

## 2022-12-11 ENCOUNTER — Other Ambulatory Visit: Payer: Self-pay | Admitting: Cardiovascular Disease

## 2022-12-12 ENCOUNTER — Encounter: Payer: Self-pay | Admitting: Pharmacist

## 2022-12-12 ENCOUNTER — Other Ambulatory Visit (HOSPITAL_COMMUNITY): Payer: Self-pay

## 2022-12-23 NOTE — Progress Notes (Signed)
error 

## 2022-12-31 ENCOUNTER — Ambulatory Visit: Payer: Commercial Managed Care - PPO | Admitting: Nurse Practitioner

## 2023-01-05 ENCOUNTER — Other Ambulatory Visit: Payer: Self-pay | Admitting: *Deleted

## 2023-01-05 DIAGNOSIS — J4520 Mild intermittent asthma, uncomplicated: Secondary | ICD-10-CM

## 2023-01-08 ENCOUNTER — Other Ambulatory Visit: Payer: Self-pay | Admitting: Cardiovascular Disease

## 2023-01-09 NOTE — Telephone Encounter (Signed)
LVM to assess Mounjaro 2.5 tolerability and patient readiness to titrate the dose further.

## 2023-01-13 NOTE — Progress Notes (Deleted)
Cardiology Office Note    Patient Name: David Ortega Date of Encounter: 01/13/2023  Primary Care Provider:  Bennie Pierini, FNP Primary Cardiologist:  Charlton Haws, MD Primary Electrophysiologist: None   Past Medical History    Past Medical History:  Diagnosis Date   Asthma    COPD (chronic obstructive pulmonary disease) (HCC)    Dyslipidemia    GERD (gastroesophageal reflux disease)    Hyperlipidemia    Hypertension    Kidney stones 11/2013   Precordial chest pain 07/02/2022   Myoview 07/07/22: EF 55, no ischemia, low risk    History of Present Illness   David Ortega is a 62 year old male with a PMH of HTN, HLD, COPD, OSA (on CPAP), HFpEF, family history of CAD, active smoker, obesity who presents today for F/U  I initially saw him in hospital May 2024 for chest pain R/O and set up for myovue 07/07/22 this was normal with EF 55% had f/u calcium score that was very elevated at 4355 , 99 th percentile  Cardiac CTA suggested obstructive dx in proximal RCA. 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    All other systems reviewed and are otherwise negative except as noted above.  Physical Exam    Wt Readings from Last 3 Encounters:  12/08/22 279 lb (126.6 kg)  10/10/22 (!) 303 lb (137.4 kg)  10/01/22 (!) 305 lb  3.2 oz (138.4 kg)   ON:GEXBM were no vitals filed for this visit.,There is no height or weight on file to calculate BMI.  Affect appropriate Obese male  HEENT: normal Neck supple with no adenopathy JVP normal no bruits no thyromegaly Lungs clear with no wheezing and good diaphragmatic motion Heart:  S1/S2 no murmur, no rub, gallop or click PMI normal Abdomen: benighn, BS positve, no tenderness, no AAA no bruit.  No HSM or HJR Distal pulses intact with no bruits No edema Neuro non-focal Skin warm and dry No muscular weakness   EKG/LABS/ Recent Cardiac Studies   ECG personally reviewed by me today -none completed today  Risk Assessment/Calculations:          Lab Results  Component Value Date   WBC 8.5 12/08/2022   HGB 14.3 12/08/2022   HCT 42.3 12/08/2022   MCV 98 (H) 12/08/2022   PLT 198 12/08/2022   Lab Results  Component Value Date   CREATININE 1.17 12/08/2022   BUN 11 12/08/2022   NA 142 12/08/2022   K 4.8 12/08/2022   CL 104 12/08/2022   CO2 23 12/08/2022   Lab Results  Component Value Date   CHOL 108 12/08/2022   HDL 53 12/08/2022   LDLCALC 37 12/08/2022   TRIG 97 12/08/2022   CHOLHDL 2.0 12/08/2022    Lab Results  Component Value Date   HGBA1C 5.9 (H) 12/08/2022   Assessment & Plan    1.  Coronary artery disease: -s/p LHC 09/17/22. I reviewed films and think he has surgical dx. The proximal LAD at D1 take off not well laid out and has aneurysmal dilatation of proximal portion of the D1. His RCA mid/distal also looked hemodynamically significant. Encouraging that his CT FFR only positive distally and had non ischemic myovue 07/07/22. [employer] that he is a [employer]. Needs to lose weight to be ideal candidate for any surgery Currently on escalated GDMT with no angina ***  2.HFpEF: -Patient's 2D echo with an EF of 60 to 65% no RWMA with mild concentric LVH and grade 1 DD -Today patient is euvolemic on examination and reports improvement to shortness  of breath since losing weight and quitting smoking -He was advised to abstain from excess salt and will continue Lasix 20 mg daily -Low sodium diet, fluid restriction <2L, and daily weights encouraged. Educated to contact our office for weight gain of 2 lbs overnight or 5 lbs in one week.    3.  Chronic shortness of breath: -Patient has referral to pulmonology -Patient has quit smoking and notes improvement to shortness of breath  4.Hyperlipidemia: -Patient's last LDL cholesterol was 85 -We will increase atorvastatin to 80 mg daily  -Continue ezetimibe 10 mg daily -We will check LFTs and lipids in 8 weeks    5.  Morbid obesity: -Patient's current BMI is 42.59 -He has lost a total of 7 pounds and is interested in weight loss medications -He will be referred to Pharm.D. for further evaluation and also will be followed by our life coach.  Disposition: Follow-up with Charlton Haws, MD or APP as scheduled   Signed, Charlton Haws, NP 01/13/2023, 11:44 AM [facility]

## 2023-01-14 ENCOUNTER — Telehealth: Payer: Self-pay

## 2023-01-14 DIAGNOSIS — I5032 Chronic diastolic (congestive) heart failure: Secondary | ICD-10-CM

## 2023-01-14 DIAGNOSIS — I5030 Unspecified diastolic (congestive) heart failure: Secondary | ICD-10-CM

## 2023-01-14 DIAGNOSIS — R0602 Shortness of breath: Secondary | ICD-10-CM

## 2023-01-14 DIAGNOSIS — G4733 Obstructive sleep apnea (adult) (pediatric): Secondary | ICD-10-CM

## 2023-01-14 NOTE — Telephone Encounter (Signed)
Reordered referral and placed order for PFTs.  Left message for patient to call back, so he is aware of update.

## 2023-01-14 NOTE — Telephone Encounter (Signed)
Patient returned RN Pam's call.

## 2023-01-14 NOTE — Telephone Encounter (Signed)
-----   Message from Charlton Haws sent at 01/13/2023 12:01 PM EST ----- Patient was supposed to have referral to pulmonary for dyspnea long overdue ?? Can we order PFTls and get that referral done

## 2023-01-14 NOTE — Telephone Encounter (Signed)
Patient aware of order for PFT. Patient sees pulmonologist tomorrow.

## 2023-01-15 ENCOUNTER — Institutional Professional Consult (permissible substitution): Payer: Commercial Managed Care - PPO | Admitting: Pulmonary Disease

## 2023-01-19 ENCOUNTER — Ambulatory Visit: Payer: Commercial Managed Care - PPO | Admitting: Cardiovascular Disease

## 2023-01-19 NOTE — Telephone Encounter (Signed)
LMOM - 5 mg rx was sent to pharmacy.  If he wants to stay at 2.5 mg dose he will need to call back to office

## 2023-03-05 NOTE — Progress Notes (Deleted)
 CARDIOLOGY CONSULT NOTE       Patient ID: David Ortega MRN: [id number] DOB/AGE: [birth year] 64 y.o.  Primary Physician: Bennie Pierini, FNP Primary Cardiologist: Eden Emms   HPI:  63 y.o. first seen in hospital May 2024. Chest pain. R/O History of OSA, HTN, COPD, HLD smoker with obesity. Had initial outpatient myovue 07/04/22 normal EF 55% F/U calcium score 08/11/22 very elevated 4355 , 99 th percentile  Cardiac CTA 08/29/22 ? LM > 70% positive FFR Cath 09/17/22 Dr Swaziland reviewed   Conclusion      Mid RCA lesion is 70% stenosed.   Ost LM to Mid LM lesion is 50% stenosed.   Prox LAD lesion is 90% stenosed.   1st Diag lesion is 50% stenosed.   Mid Cx to Dist Cx lesion is 50% stenosed.   2nd Mrg lesion is 50% stenosed.   The left ventricular systolic function is normal.   LV end diastolic pressure is mildly elevated.   The left ventricular ejection fraction is 55-65% by visual estimate.   Multivessel CAD. 50% left main. Complex 90% proximal LAD. The LAD is calcified and tortuous. There is aneurysmal enlargement after the first diagonal. Despite multiple views and overlap it is difficult to tell if this is a true bifurcation lesion that involves the ostium of the diagonal or not. There is 70-75% disease in the mid RCA which is a large vessel. Normal LV function Mildly elevated LVEDP   Plan: at this point I would recommend initiating optimal medical therapy for angina. He needs aggressive risk factor modification. Based on his history it is unclear if his symptoms of dyspnea are ischemic or are more driven by pulmonary disease. I would recommend pulmonary evaluation. From a revascularization standpoint we could consider PCI of the LAD and mid RCA. The LAD lesion could turn out to be quite complex if it involves the diagonal origin and may require plaque modification due to calcification. In that case surgical revascularization may be more beneficial.   Seen by PA 64/4/24 Had stopped  smoking. Lost 7 lbs with only 1 episdoe of angina. His weight has still been over 300 lbs Imdur increased to 60 mg daily TTE 06/22/22 EF 60-65% no valve dx   ***  ROS All other systems reviewed and negative except as noted above  Past Medical History:  Diagnosis Date   Asthma    COPD (chronic obstructive pulmonary disease) (HCC)    Dyslipidemia    GERD (gastroesophageal reflux disease)    Hyperlipidemia    Hypertension    Kidney stones 11/2013   Precordial chest pain 07/02/2022   Myoview 07/07/22: EF 55, no ischemia, low risk    Family History  Problem Relation Age of Onset   Diabetes Mother    Heart disease Mother    Diabetes Sister    Cancer Brother    Cancer Sister        pancreatic    Social History   Socioeconomic History   Marital status: Married    Spouse name: Not on file   Number of children: Not on file   Years of education: Not on file   Highest education level: GED or equivalent  Occupational History   Not on file  Tobacco Use   Smoking status: Every Day    Current packs/day: 1.00    Average packs/day: 1 pack/day for 38.0 years (38.0 ttl pk-yrs)    Types: Cigarettes   Smokeless tobacco: Never  Vaping Use   Vaping status: Never  Used  Substance and Sexual Activity   Alcohol use: No   Drug use: No   Sexual activity: Yes  Other Topics Concern   Not on file  Social History Narrative   Not on file   Social Drivers of Health   Financial Resource Strain: Low Risk  (12/06/2022)   Overall Financial Resource Strain (CARDIA)    Difficulty of Paying Living Expenses: Not very hard  Food Insecurity: No Food Insecurity (12/06/2022)   Hunger Vital Sign    Worried About Running Out of Food in the Last Year: Never true    Ran Out of Food in the Last Year: Never true  Transportation Needs: No Transportation Needs (12/06/2022)   PRAPARE - Administrator, Civil Service (Medical): No    Lack of Transportation (Non-Medical): No  Physical Activity:  Insufficiently Active (12/06/2022)   Exercise Vital Sign    Days of Exercise per Week: 3 days    Minutes of Exercise per Session: 30 min  Stress: No Stress Concern Present (12/06/2022)   [employer] of Occupational Health - Occupational Stress Questionnaire    Feeling of Stress : Only a little  Social Connections: Moderately Integrated (12/06/2022)   Social Connection and Isolation Panel [NHANES]    Frequency of Communication with Friends and Family: More than three times a week    Frequency of Social Gatherings with Friends and Family: More than three times a week    Attends Religious Services: 1 to 4 times per year    Active Member of [employer] or Organizations: No    Attends Engineer, structural: Not on file    Marital Status: Married  Catering manager Violence: Not on file    Past Surgical History:  Procedure Laterality Date   BACK SURGERY     CHOLECYSTECTOMY     COLONOSCOPY N/A 09/05/2016   Procedure: COLONOSCOPY;  Surgeon: Corbin Ade, MD;  Location: [facility];  Service: Endoscopy;  Laterality: N/A;  2:15 PM   ESOPHAGOGASTRODUODENOSCOPY     LEFT ANKLE SURGERY     LEFT HEART CATH AND CORONARY ANGIOGRAPHY N/A 09/17/2022   Procedure: LEFT HEART CATH AND CORONARY ANGIOGRAPHY;  Surgeon: Swaziland, Marceil Welp M, MD;  Location: [facility] INVASIVE CV LAB;  Service: Cardiovascular;  Laterality: N/A;      Current Outpatient Medications:    albuterol (VENTOLIN HFA) 108 (90 Base) MCG/ACT inhaler, INHALE 2 PUFFS BY MOUTH EVERY 6 HOURS AS NEEDED FOR WHEEZING AND SHORTNESS OF BREATH (APPOINTMENT REQUIRED FOR FUTURE REFILLS), Disp: 9 g, Rfl: 3   aspirin EC 81 MG tablet, Take 81 mg by mouth daily. Swallow whole., Disp: , Rfl:    atorvastatin (LIPITOR) 80 MG tablet, Take 1 tablet (80 mg total) by mouth daily., Disp: 90 tablet, Rfl: 1   budesonide-formoterol (SYMBICORT) 160-4.5 MCG/ACT inhaler, Inhale 2 puffs into the lungs 2 (two) times daily., Disp: 33 g, Rfl: 3   buPROPion (WELLBUTRIN XL) 300  MG 24 hr tablet, Take 1 tablet (300 mg total) by mouth daily., Disp: 90 tablet, Rfl: 1   busPIRone (BUSPAR) 15 MG tablet, Take 1 tablet (15 mg total) by mouth 2 (two) times daily., Disp: 60 tablet, Rfl: 2   escitalopram (LEXAPRO) 20 MG tablet, Take 1 tablet (20 mg total) by mouth daily., Disp: 90 tablet, Rfl: 1   ezetimibe (ZETIA) 10 MG tablet, Take 1 tablet (10 mg total) by mouth at bedtime., Disp: 90 tablet, Rfl: 1   furosemide (LASIX) 20 MG tablet, Take 1  tablet (20 mg total) by mouth daily as needed for edema., Disp: 90 tablet, Rfl: 1   isosorbide mononitrate (IMDUR) 60 MG 24 hr tablet, Take 1 tablet (60 mg total) by mouth daily., Disp: 90 tablet, Rfl: 1   losartan (COZAAR) 100 MG tablet, Take 1 tablet (100 mg total) by mouth daily. Pt takes 1 tablet by mouth once a day., Disp: 90 tablet, Rfl: 1   meclizine (ANTIVERT) 25 MG tablet, TAKE 1 TABLET BY MOUTH THREE TIMES DAILY AS NEEDED FOR  DIZZINESS, Disp: 30 tablet, Rfl: 0   meloxicam (MOBIC) 15 MG tablet, Take 1 tablet by mouth once daily, Disp: 30 tablet, Rfl: 0   midodrine (PROAMATINE) 5 MG tablet, Take 1 tablet (5 mg total) by mouth daily as needed., Disp: 30 tablet, Rfl: 1   mupirocin ointment (BACTROBAN) 2 %, Apply 1 application. topically 2 (two) times daily. (Patient taking differently: Apply 1 application  topically 2 (two) times daily as needed (wound care).), Disp: 22 g, Rfl: 0   nitroGLYCERIN (NITROSTAT) 0.4 MG SL tablet, Place 1 tablet (0.4 mg total) under the tongue every 5 (five) minutes x 3 doses as needed for chest pain., Disp: 25 tablet, Rfl: 12   omeprazole (PRILOSEC) 40 MG capsule, Take 1 capsule (40 mg total) by mouth daily., Disp: 90 capsule, Rfl: 1   tirzepatide (MOUNJARO) 2.5 MG/0.5ML Pen, INJECT 1 SYRINGE SUBCUTANEOUSLY ONCE A WEEK (2.5  MG), Disp: 2 mL, Rfl: 0   tirzepatide (MOUNJARO) 5 MG/0.5ML Pen, Inject 5 mg into the skin once a week., Disp: 2 mL, Rfl: 1   tirzepatide (MOUNJARO) 7.5 MG/0.5ML Pen, Inject 7.5 mg into the  skin once a week., Disp: 2 mL, Rfl: 1   triamcinolone cream (KENALOG) 0.1 %, APPLY ONE APPLICATION TWICE DAILY (Patient taking differently: Apply 1 Application topically 2 (two) times daily as needed (irritation).), Disp: 453.6 g, Rfl: 1    Physical Exam: There were no vitals taken for this visit.    Affect appropriate Obese male  HEENT: normal Neck supple with no adenopathy JVP normal no bruits no thyromegaly Lungs clear with no wheezing and good diaphragmatic motion Heart:  S1/S2 no murmur, no rub, gallop or click PMI normal Abdomen: benighn, BS positve, no tenderness, no AAA no bruit.  No HSM or HJR Distal pulses intact with no bruits No edema Neuro non-focal Skin warm and dry No muscular weakness   Labs:   Lab Results  Component Value Date   WBC 8.5 12/08/2022   HGB 14.3 12/08/2022   HCT 42.3 12/08/2022   MCV 98 (H) 12/08/2022   PLT 198 12/08/2022   No results for input(s): "NA", "K", "CL", "CO2", "BUN", "CREATININE", "CALCIUM", "PROT", "BILITOT", "ALKPHOS", "ALT", "AST", "GLUCOSE" in the last 168 hours.  Invalid input(s): "LABALBU" Lab Results  Component Value Date   CKTOTAL 78 06/19/2022   CKMB 0 06/19/2022    Lab Results  Component Value Date   CHOL 108 12/08/2022   CHOL 175 06/22/2022   CHOL 169 05/15/2022   Lab Results  Component Value Date   HDL 53 12/08/2022   HDL 51 06/22/2022   HDL 54 05/15/2022   Lab Results  Component Value Date   LDLCALC 37 12/08/2022   LDLCALC 85 06/22/2022   LDLCALC 87 05/15/2022   Lab Results  Component Value Date   TRIG 97 12/08/2022   TRIG 195 (H) 06/22/2022   TRIG 163 (H) 05/15/2022   Lab Results  Component Value Date   CHOLHDL 2.0 12/08/2022  CHOLHDL 3.4 06/22/2022   CHOLHDL 3.1 05/15/2022   No results found for: "LDLDIRECT"    Radiology: No results found.  EKG: SR rate 74 normal 12/08/22    ASSESSMENT AND PLAN:   CAD:  see cath report above Concern for LM/3 vessel dx Per Dr Swaziland trial medical  Rx. Minimal angina Dyspnea with obesity. Continue ASA, statin, zetia, Imdur *** HTN:  improved on ARB and diuretic  HLD: continue statin and zetia  LDL at goal 37  Obesity:  Discussed diet and exercise on Mounjaro since 10/13/22   ***  F/U 6 months  Signed: Charlton Haws 03/05/2023, 2:52 PM

## 2023-03-10 ENCOUNTER — Ambulatory Visit: Payer: Commercial Managed Care - PPO | Admitting: Nurse Practitioner

## 2023-03-13 ENCOUNTER — Ambulatory Visit: Payer: Commercial Managed Care - PPO | Admitting: Cardiovascular Disease

## 2023-03-17 ENCOUNTER — Other Ambulatory Visit: Payer: Self-pay | Admitting: *Deleted

## 2023-03-17 ENCOUNTER — Ambulatory Visit: Payer: Commercial Managed Care - PPO | Admitting: Nurse Practitioner

## 2023-03-17 DIAGNOSIS — J4520 Mild intermittent asthma, uncomplicated: Secondary | ICD-10-CM

## 2023-03-17 MED ORDER — BUDESONIDE-FORMOTEROL FUMARATE 160-4.5 MCG/ACT IN AERO: 2.0000 | INHALATION_SPRAY | Freq: Two times a day (BID) | RESPIRATORY_TRACT | 0 refills | Status: DC

## 2023-03-25 ENCOUNTER — Institutional Professional Consult (permissible substitution): Payer: Commercial Managed Care - PPO | Admitting: Pulmonary Disease

## 2023-03-31 ENCOUNTER — Ambulatory Visit: Payer: Commercial Managed Care - PPO | Admitting: Nurse Practitioner

## 2023-03-31 ENCOUNTER — Encounter: Payer: Self-pay | Admitting: Nurse Practitioner

## 2023-03-31 VITALS — BP 140/85 | HR 93 | Temp 97.9°F | Ht 71.0 in | Wt 260.0 lb

## 2023-03-31 DIAGNOSIS — F3341 Major depressive disorder, recurrent, in partial remission: Secondary | ICD-10-CM

## 2023-03-31 DIAGNOSIS — K219 Gastro-esophageal reflux disease without esophagitis: Secondary | ICD-10-CM | POA: Diagnosis not present

## 2023-03-31 DIAGNOSIS — I10 Essential (primary) hypertension: Secondary | ICD-10-CM | POA: Diagnosis not present

## 2023-03-31 DIAGNOSIS — E119 Type 2 diabetes mellitus without complications: Secondary | ICD-10-CM | POA: Diagnosis not present

## 2023-03-31 DIAGNOSIS — E782 Mixed hyperlipidemia: Secondary | ICD-10-CM

## 2023-03-31 DIAGNOSIS — Z6836 Body mass index (BMI) 36.0-36.9, adult: Secondary | ICD-10-CM

## 2023-03-31 DIAGNOSIS — G4733 Obstructive sleep apnea (adult) (pediatric): Secondary | ICD-10-CM

## 2023-03-31 LAB — BAYER DCA HB A1C WAIVED: HB A1C (BAYER DCA - WAIVED): 5.6 % (ref 4.8–5.6)

## 2023-03-31 MED ORDER — MOUNJARO 7.5 MG/0.5ML ~~LOC~~ SOAJ: 7.5000 mg | SUBCUTANEOUS | 1 refills | Status: DC

## 2023-03-31 MED ORDER — BUPROPION HCL ER (XL) 300 MG PO TB24: 300.0000 mg | ORAL_TABLET | Freq: Every day | ORAL | 1 refills | Status: DC

## 2023-03-31 MED ORDER — BUSPIRONE HCL 15 MG PO TABS: 15.0000 mg | ORAL_TABLET | Freq: Two times a day (BID) | ORAL | 2 refills | Status: DC

## 2023-03-31 MED ORDER — ESCITALOPRAM OXALATE 20 MG PO TABS: 20.0000 mg | ORAL_TABLET | Freq: Every day | ORAL | 1 refills | Status: DC

## 2023-03-31 MED ORDER — FUROSEMIDE 20 MG PO TABS: 20.0000 mg | ORAL_TABLET | Freq: Every day | ORAL | Status: DC | PRN

## 2023-03-31 MED ORDER — EZETIMIBE 10 MG PO TABS: 10.0000 mg | ORAL_TABLET | Freq: Every day | ORAL | 1 refills | Status: DC

## 2023-03-31 MED ORDER — LOSARTAN POTASSIUM 100 MG PO TABS: 100.0000 mg | ORAL_TABLET | Freq: Every day | ORAL | 1 refills | Status: DC

## 2023-03-31 MED ORDER — OMEPRAZOLE 40 MG PO CPDR: 40.0000 mg | DELAYED_RELEASE_CAPSULE | Freq: Every day | ORAL | 1 refills | Status: DC

## 2023-03-31 MED ORDER — ISOSORBIDE MONONITRATE ER 60 MG PO TB24: 60.0000 mg | ORAL_TABLET | Freq: Every day | ORAL | 1 refills | Status: DC

## 2023-03-31 MED ORDER — ATORVASTATIN CALCIUM 80 MG PO TABS: 80.0000 mg | ORAL_TABLET | Freq: Every day | ORAL | 1 refills | Status: DC

## 2023-03-31 NOTE — Patient Instructions (Signed)

## 2023-03-31 NOTE — Progress Notes (Signed)
 Subjective:    Patient ID: David Ortega, male    DOB: [birth year], 63 y.o.   MRN: [id number]   Chief Complaint: medical management of chronic issues     HPI:  David Ortega is a 63 y.o. who identifies as a male who was assigned male at birth.   Social history: Lives with: wife Work history: works on trucks   Comes in today for follow up of the following chronic medical issues:  1. Primary hypertension No c/o  sob or headache. Does not check blood pressure at home. BP Readings from Last 3 Encounters:  12/08/22 133/84  10/01/22 128/80  09/17/22 (!) 129/59     2. Mixed hyperlipidemia Does not watch diet and does no exercise at all Lab Results  Component Value Date   CHOL 108 12/08/2022   HDL 53 12/08/2022   LDLCALC 37 12/08/2022   TRIG 97 12/08/2022   CHOLHDL 2.0 12/08/2022     3. Gastroesophageal reflux disease without esophagitis Is on omeprazole daily and is doing well  4. OSA (obstructive sleep apnea) Wears cpap nightly  5. Recurrent major depressive disorder, in partial remission (HCC) Is on wellbutrin, lexapro and buspar daily. Is doing well most of he time     03/31/2023    4:02 PM 12/08/2022    3:06 PM 06/19/2022    8:39 AM  Depression screen PHQ 2/9  Decreased Interest 1 1 1   Down, Depressed, Hopeless 0 0 0  PHQ - 2 Score 1 1 1   Altered sleeping 1 1 1   Tired, decreased energy 1 1 1   Change in appetite 0 0 0  Feeling bad or failure about yourself  0 0 0  Trouble concentrating 0 0 0  Moving slowly or fidgety/restless 0 0 0  Suicidal thoughts 0 0 0  PHQ-9 Score 3 3 3   Difficult doing work/chores Somewhat difficult Somewhat difficult Not difficult at all       6. Unstable angina No chest pain since last visit.   7. BMI 39.0-39.9,adult Weight is down 19lbs. He is on [employer] Readings from Last 3 Encounters:  03/31/23 260 lb (117.9 kg)  12/08/22 279 lb (126.6 kg)  10/10/22 (!) 303 lb (137.4 kg)   BMI Readings from Last 3  Encounters:  03/31/23 36.26 kg/m  12/08/22 38.91 kg/m  10/10/22 42.26 kg/m        Allergies  Allergen Reactions   Propranolol Shortness Of Breath   Lisinopril Cough   Simvastatin     Leg pain   Outpatient Encounter Medications as of 03/31/2023  Medication Sig   albuterol (VENTOLIN HFA) 108 (90 Base) MCG/ACT inhaler INHALE 2 PUFFS BY MOUTH EVERY 6 HOURS AS NEEDED FOR WHEEZING AND SHORTNESS OF BREATH (APPOINTMENT REQUIRED FOR FUTURE REFILLS)   aspirin EC 81 MG tablet Take 81 mg by mouth daily. Swallow whole.   atorvastatin (LIPITOR) 80 MG tablet Take 1 tablet (80 mg total) by mouth daily.   budesonide-formoterol (SYMBICORT) 160-4.5 MCG/ACT inhaler Inhale 2 puffs into the lungs 2 (two) times daily.   buPROPion (WELLBUTRIN XL) 300 MG 24 hr tablet Take 1 tablet (300 mg total) by mouth daily.   busPIRone (BUSPAR) 15 MG tablet Take 1 tablet (15 mg total) by mouth 2 (two) times daily.   escitalopram (LEXAPRO) 20 MG tablet Take 1 tablet (20 mg total) by mouth daily.   ezetimibe (ZETIA) 10 MG tablet Take 1 tablet (10 mg total) by mouth at bedtime.   furosemide (LASIX)  20 MG tablet Take 1 tablet (20 mg total) by mouth daily as needed for edema.   isosorbide mononitrate (IMDUR) 60 MG 24 hr tablet Take 1 tablet (60 mg total) by mouth daily.   losartan (COZAAR) 100 MG tablet Take 1 tablet (100 mg total) by mouth daily. Pt takes 1 tablet by mouth once a day.   meclizine (ANTIVERT) 25 MG tablet TAKE 1 TABLET BY MOUTH THREE TIMES DAILY AS NEEDED FOR  DIZZINESS   meloxicam (MOBIC) 15 MG tablet Take 1 tablet by mouth once daily   midodrine (PROAMATINE) 5 MG tablet Take 1 tablet (5 mg total) by mouth daily as needed.   mupirocin ointment (BACTROBAN) 2 % Apply 1 application. topically 2 (two) times daily. (Patient taking differently: Apply 1 application  topically 2 (two) times daily as needed (wound care).)   nitroGLYCERIN (NITROSTAT) 0.4 MG SL tablet Place 1 tablet (0.4 mg total) under the tongue  every 5 (five) minutes x 3 doses as needed for chest pain.   omeprazole (PRILOSEC) 40 MG capsule Take 1 capsule (40 mg total) by mouth daily.   tirzepatide (MOUNJARO) 2.5 MG/0.5ML Pen INJECT 1 SYRINGE SUBCUTANEOUSLY ONCE A WEEK (2.5  MG)   tirzepatide (MOUNJARO) 5 MG/0.5ML Pen Inject 5 mg into the skin once a week.   tirzepatide (MOUNJARO) 7.5 MG/0.5ML Pen Inject 7.5 mg into the skin once a week.   triamcinolone cream (KENALOG) 0.1 % APPLY ONE APPLICATION TWICE DAILY (Patient taking differently: Apply 1 Application topically 2 (two) times daily as needed (irritation).)   No facility-administered encounter medications on file as of 03/31/2023.    Past Surgical History:  Procedure Laterality Date   BACK SURGERY     CHOLECYSTECTOMY     COLONOSCOPY N/A 09/05/2016   Procedure: COLONOSCOPY;  Surgeon: Corbin Ade, MD;  Location: [facility];  Service: Endoscopy;  Laterality: N/A;  2:15 PM   ESOPHAGOGASTRODUODENOSCOPY     LEFT ANKLE SURGERY     LEFT HEART CATH AND CORONARY ANGIOGRAPHY N/A 09/17/2022   Procedure: LEFT HEART CATH AND CORONARY ANGIOGRAPHY;  Surgeon: Swaziland, Peter M, MD;  Location: [facility] INVASIVE CV LAB;  Service: Cardiovascular;  Laterality: N/A;    Family History  Problem Relation Age of Onset   Diabetes Mother    Heart disease Mother    Diabetes Sister    Cancer Brother    Cancer Sister        pancreatic      Controlled substance contract: n/a     Review of Systems  Constitutional:  Negative for diaphoresis.  Eyes:  Negative for pain.  Respiratory:  Negative for shortness of breath.   Cardiovascular:  Negative for chest pain, palpitations and leg swelling.  Gastrointestinal:  Negative for abdominal pain.  Endocrine: Negative for polydipsia.  Skin:  Negative for rash.  Neurological:  Negative for dizziness, weakness and headaches.  Hematological:  Does not bruise/bleed easily.  All other systems reviewed and are negative.      Objective:   Physical  Exam Vitals and nursing note reviewed.  Constitutional:      Appearance: Normal appearance. He is well-developed.  HENT:     Head: Normocephalic.     Nose: Nose normal.     Mouth/Throat:     Mouth: Mucous membranes are moist.     Pharynx: Oropharynx is clear.  Eyes:     Pupils: Pupils are equal, round, and reactive to light.  Neck:     Thyroid: No thyroid mass or  thyromegaly.     Vascular: No carotid bruit or JVD.     Trachea: Phonation normal.  Cardiovascular:     Rate and Rhythm: Normal rate and regular rhythm.  Pulmonary:     Effort: Pulmonary effort is normal. No respiratory distress.     Breath sounds: Normal breath sounds.  Abdominal:     General: Bowel sounds are normal.     Palpations: Abdomen is soft.     Tenderness: There is no abdominal tenderness.  Musculoskeletal:        General: Normal range of motion.     Cervical back: Normal range of motion and neck supple.  Lymphadenopathy:     Cervical: No cervical adenopathy.  Skin:    General: Skin is warm and dry.  Neurological:     Mental Status: He is alert and oriented to person, place, and time.  Psychiatric:        Behavior: Behavior normal.        Thought Content: Thought content normal.        Judgment: Judgment normal.      BP (!) 140/85   Pulse 93   Temp 97.9 F (36.6 C) (Temporal)   Ht 5\' 11"  (1.803 m)   Wt 260 lb (117.9 kg)   SpO2 95%   BMI 36.26 kg/m    Hgba1c 5.6%     Assessment & Plan:  TERESA LEMMERMAN comes in today with chief complaint of No chief complaint on file.   Diagnosis and orders addressed:  1. Primary hypertension Low sodium diet - CBC with Differential/Platelet - CMP14+EGFR - furosemide (LASIX) 20 MG tablet; Take 1 tablet (20 mg total) by mouth daily as needed for edema.  Dispense: 90 tablet; Refill: 1  2. Mixed hyperlipidemia Low fat diet - Lipid panel - atorvastatin (LIPITOR) 80 MG tablet; Take 1 tablet (80 mg total) by mouth daily.  Dispense: 90 tablet;  Refill: 1  3. Gastroesophageal reflux disease without esophagitis Avoid spicy foods Do not eat 2 hours prior to bedtime   4. OSA (obstructive sleep apnea) Wear CPAP  5. Recurrent major depressive disorder, in partial remission (HCC) Stress management - buPROPion (WELLBUTRIN XL) 300 MG 24 hr tablet; Take 1 tablet (300 mg total) by mouth daily.  Dispense: 90 tablet; Refill: 1 - busPIRone (BUSPAR) 15 MG tablet; Take 1 tablet (15 mg total) by mouth 2 (two) times daily.  Dispense: 60 tablet; Refill: 2 - escitalopram (LEXAPRO) 20 MG tablet; Take 1 tablet (20 mg total) by mouth daily.  Dispense: 90 tablet; Refill: 1  6. BMI 39.0-39.9,adult Discussed diet and exercise for person with BMI >25 Will recheck weight in 3-6 months   7. Elevated blood sugar - Bayer DCA Hb A1c Waived  8. Diet-controlled diabetes mellitus (HCC) On  mounjario  9. Other chest pain - EKG 12-Lead  10. Asthma, chronic, mild intermittent, uncomplicated - budesonide-formoterol (SYMBICORT) 160-4.5 MCG/ACT inhaler; Inhale 2 puffs into the lungs 2 (two) times daily.  Dispense: 33 g; Refill: 3   Labs pending Health Maintenance reviewed Diet and exercise encouraged  Follow up plan: 3 months   Mary-Margaret Daphine Deutscher, FNP

## 2023-04-01 LAB — CBC WITH DIFFERENTIAL/PLATELET
Basophils Absolute: 0 10*3/uL (ref 0.0–0.2)
Basos: 0 %
EOS (ABSOLUTE): 0.2 10*3/uL (ref 0.0–0.4)
Eos: 2 %
Hematocrit: 42.7 % (ref 37.5–51.0)
Hemoglobin: 14.5 g/dL (ref 13.0–17.7)
Immature Grans (Abs): 0 10*3/uL (ref 0.0–0.1)
Immature Granulocytes: 0 %
Lymphocytes Absolute: 2.2 10*3/uL (ref 0.7–3.1)
Lymphs: 23 %
MCH: 33.1 pg — ABNORMAL HIGH (ref 26.6–33.0)
MCHC: 34 g/dL (ref 31.5–35.7)
MCV: 98 fL — ABNORMAL HIGH (ref 79–97)
Monocytes Absolute: 0.5 10*3/uL (ref 0.1–0.9)
Monocytes: 5 %
Neutrophils Absolute: 6.5 10*3/uL (ref 1.4–7.0)
Neutrophils: 70 %
Platelets: 259 10*3/uL (ref 150–450)
RBC: 4.38 x10E6/uL (ref 4.14–5.80)
RDW: 13.1 % (ref 11.6–15.4)
WBC: 9.5 10*3/uL (ref 3.4–10.8)

## 2023-04-01 LAB — MICROALBUMIN / CREATININE URINE RATIO
Creatinine, Urine: 209.6 mg/dL
Microalb/Creat Ratio: 67 mg/g{creat} — ABNORMAL HIGH (ref 0–29)
Microalbumin, Urine: 140.5 ug/mL

## 2023-04-01 LAB — LIPID PANEL
Chol/HDL Ratio: 2.4 ratio (ref 0.0–5.0)
Cholesterol, Total: 124 mg/dL (ref 100–199)
HDL: 52 mg/dL (ref >39)
LDL Chol Calc (NIH): 53 mg/dL (ref 0–99)
Triglycerides: 105 mg/dL (ref 0–149)
VLDL Cholesterol Cal: 19 mg/dL (ref 5–40)

## 2023-04-01 LAB — CMP14+EGFR
ALT: 13 IU/L (ref 0–44)
AST: 15 IU/L (ref 0–40)
Albumin: 4.1 g/dL (ref 3.9–4.9)
Alkaline Phosphatase: 159 IU/L — ABNORMAL HIGH (ref 44–121)
BUN/Creatinine Ratio: 9 — ABNORMAL LOW (ref 10–24)
BUN: 10 mg/dL (ref 8–27)
Bilirubin Total: 0.8 mg/dL (ref 0.0–1.2)
CO2: 23 mmol/L (ref 20–29)
Calcium: 9.5 mg/dL (ref 8.6–10.2)
Chloride: 104 mmol/L (ref 96–106)
Creatinine, Ser: 1.06 mg/dL (ref 0.76–1.27)
Globulin, Total: 2.7 g/dL (ref 1.5–4.5)
Glucose: 91 mg/dL (ref 70–99)
Potassium: 4.5 mmol/L (ref 3.5–5.2)
Sodium: 140 mmol/L (ref 134–144)
Total Protein: 6.8 g/dL (ref 6.0–8.5)
eGFR: 79 mL/min/{1.73_m2} (ref >59)

## 2023-04-02 ENCOUNTER — Encounter: Payer: Self-pay | Admitting: Pharmacist

## 2023-04-10 ENCOUNTER — Telehealth: Payer: Self-pay | Admitting: Nurse Practitioner

## 2023-04-10 NOTE — Telephone Encounter (Signed)
 Copied from CRM [id number]. Topic: Clinical - Medication Question >> Apr 10, 2023 11:06 AM DeAngela L wrote: Reason for CRM: RX Patient advocacy group calling to see if the office received and signed a fax for the patient to get Ozempic  Call back number [phone] fax # [phone]

## 2023-04-10 NOTE — Telephone Encounter (Signed)
 I'm not familiar with this pharmacy/group I gave the patient the info for [employer] Patient Assistance Program Have not received any other documents Would reach out to the patient to confirm needs

## 2023-04-17 ENCOUNTER — Telehealth: Payer: Self-pay | Admitting: Nurse Practitioner

## 2023-04-17 NOTE — Telephone Encounter (Signed)
 Copied from CRM [id number]. Topic: General - Call Back - No Documentation >> Apr 17, 2023  8:57 AM Higinio Roger wrote: Reason for CRM: Kayla from [facility] would like a call back to confirm clinic has received paperwork for ozempic on 04/14/23 for patient.  Callback #: [phone] Fax #: [phone]

## 2023-04-17 NOTE — Telephone Encounter (Signed)
 Please have them refax because I have not seen it

## 2023-04-21 NOTE — Telephone Encounter (Signed)
 Called [facility] and informed them that we have not received any paperwork. They stated they would refax to office

## 2023-04-23 NOTE — Telephone Encounter (Signed)
 Left message with patient requesting call back regarding [facility] novo nordisk paperwork received. Wanted to make sure patient isnt paying a 3rd party to help him with assistance.   We are able to complete at no charge online or mail to him again.   Gave call back number [phone]

## 2023-04-23 NOTE — Telephone Encounter (Signed)
 Left message with patient requesting call back regarding [facility] novo nordisk paperwork rec'd. Wanted to make sure patient isnt paying a 3rd party to help him with assistance.   Gave call back number [phone]

## 2023-04-23 NOTE — Telephone Encounter (Signed)
 Please check on this.

## 2023-05-21 ENCOUNTER — Other Ambulatory Visit: Payer: Self-pay | Admitting: Nurse Practitioner

## 2023-05-21 MED ORDER — ALBUTEROL SULFATE HFA 108 (90 BASE) MCG/ACT IN AERS: INHALATION_SPRAY | RESPIRATORY_TRACT | 3 refills | Status: DC

## 2023-05-26 ENCOUNTER — Ambulatory Visit: Payer: Commercial Managed Care - PPO | Admitting: Pulmonary Disease

## 2023-06-11 ENCOUNTER — Telehealth: Payer: Self-pay | Admitting: *Deleted

## 2023-06-11 NOTE — Telephone Encounter (Signed)
 Fax from [employer] RE: albuterol  HFA 90 mcg Pt has new insurance and is requiring Brand name Ventolin  Please send new script

## 2023-06-12 ENCOUNTER — Other Ambulatory Visit (HOSPITAL_COMMUNITY): Payer: Self-pay

## 2023-06-12 ENCOUNTER — Telehealth: Payer: Self-pay

## 2023-06-12 MED ORDER — VENTOLIN HFA 108 (90 BASE) MCG/ACT IN AERS: 2.0000 | INHALATION_SPRAY | Freq: Four times a day (QID) | RESPIRATORY_TRACT | 1 refills | Status: DC | PRN

## 2023-06-12 NOTE — Addendum Note (Signed)
 Addended by: Delfina Feller on: 06/12/2023 11:59 AM   Modules accepted: Orders

## 2023-06-12 NOTE — Telephone Encounter (Addendum)
 Pharmacy Patient Advocate Encounter   Received notification from CoverMyMeds that prior authorization for Albuterol  Sulfate HFA 108 (90 Base)MCG/ACT aerosol is required/requested.   Insurance verification completed.   The patient is insured through [employer] .   Per test claim: The current 30 day co-pay is, $15.00.  No PA needed at this time. This test claim was processed through [facility]- copay amounts may vary at other pharmacies due to pharmacy/plan contracts, or as the patient moves through the different stages of their insurance plan.     -Insurance covers VENTOLIN  INHALER. Please sent script to pharmacy.

## 2023-06-23 ENCOUNTER — Telehealth: Payer: Self-pay | Admitting: *Deleted

## 2023-06-23 NOTE — Telephone Encounter (Signed)
 Pam,  Per Sherline Distel you would need to reach out to Mr.Habermehl re: following up with a provider and have them send us  the result from the PFT's.  Oralee Billow ([phone]) Mr. Beirne stated--his Insurance don't cover anything in the [facility] system or [place] This is for his PFT. Please advise. Thanks   Not sure what to tell him, does he have a provider who is covered by his insurance? Maybe he can get records from us  and take them to that doctor, so they can follow-up on any test he might need.    No he said he need to find a family MD.   Cephas Collier, I think that is what he needs to do ASAP   Ok, I'll call his back on Tuesday. Thanks

## 2023-06-24 NOTE — Telephone Encounter (Signed)
 Left message for patient to call back. Not sure what can be done about insurance. Referrals for pulmonology and PFTs were ordered, can change referral to a pulmonologist in patient's network. Will need patient to give us  that information. Will await for patient to call back.

## 2023-06-29 ENCOUNTER — Other Ambulatory Visit: Payer: Self-pay | Admitting: Nurse Practitioner

## 2023-06-29 DIAGNOSIS — J4520 Mild intermittent asthma, uncomplicated: Secondary | ICD-10-CM

## 2023-06-30 NOTE — Progress Notes (Deleted)
 CARDIOLOGY CONSULT NOTE       Patient ID: David Ortega MRN: [id number] DOB/AGE: 03/21/1960 63 y.o.  Primary Physician: Delfina Feller, FNP Primary Cardiologist: Stann Earnest   HPI:  63 y.o. first seen 06/21/22 in hospital for chest pain. History of OSA, HTN, COPD, HLD and family history CAD. He is an active smoker. He r/o for infarction Had abnoraml cardiac CTA8/8/24  with calcium  score 4164  , 99 th percentile TPV 2704 mm3 Positive FFR LAD 0.79, D2 0.68 and RCA/LCX only distally. Cath by Dr Swaziland 09/17/22 Showed 50% LM, 90% proximal LAD ? Involving Diagonal ostium and 70% mid RCA  Dr Swaziland recommended medical Rx ?? Symptom dyspnea from lung dx and obesity. TTE with normal EF no significant valve dx 05/2022. Not on beta blocker ? Due to COPD  LDL at goal 53 LpA elevated 52.3   Was supposed to have PFTls and see pulmonary ***    ROS All other systems reviewed and negative except as noted above  Past Medical History:  Diagnosis Date  . Asthma   . COPD (chronic obstructive pulmonary disease) (HCC)   . Dyslipidemia   . GERD (gastroesophageal reflux disease)   . Hyperlipidemia   . Hypertension   . Kidney stones 11/2013  . Precordial chest pain 07/02/2022   Myoview  07/07/22: EF 55, no ischemia, low risk    Family History  Problem Relation Age of Onset  . Diabetes Mother   . Heart disease Mother   . Diabetes Sister   . Cancer Brother   . Cancer Sister        pancreatic    Social History   Socioeconomic History  . Marital status: Married    Spouse name: Not on file  . Number of children: Not on file  . Years of education: Not on file  . Highest education level: GED or equivalent  Occupational History  . Not on file  Tobacco Use  . Smoking status: Every Day    Current packs/day: 1.00    Average packs/day: 1 pack/day for 38.0 years (38.0 ttl pk-yrs)    Types: Cigarettes  . Smokeless tobacco: Never  Vaping Use  . Vaping status: Never Used  Substance and  Sexual Activity  . Alcohol use: No  . Drug use: No  . Sexual activity: Yes  Other Topics Concern  . Not on file  Social History Narrative  . Not on file   Social Drivers of Health   Financial Resource Strain: Low Risk  (03/30/2023)   Overall Financial Resource Strain (CARDIA)   . Difficulty of Paying Living Expenses: Not hard at all  Food Insecurity: No Food Insecurity (03/30/2023)   Hunger Vital Sign   . Worried About Programme researcher, broadcasting/film/video in the Last Year: Never true   . Ran Out of Food in the Last Year: Never true  Transportation Needs: No Transportation Needs (03/30/2023)   PRAPARE - Transportation   . Lack of Transportation (Medical): No   . Lack of Transportation (Non-Medical): No  Physical Activity: Insufficiently Active (03/30/2023)   Exercise Vital Sign   . Days of Exercise per Week: 5 days   . Minutes of Exercise per Session: 20 min  Stress: No Stress Concern Present (03/30/2023)   [employer] of Occupational Health - Occupational Stress Questionnaire   . Feeling of Stress : Only a little  Social Connections: Moderately Isolated (03/30/2023)   Social Connection and Isolation Panel [NHANES]   . Frequency of Communication with Friends and  Family: More than three times a week   . Frequency of Social Gatherings with Friends and Family: Once a week   . Attends Religious Services: Never   . Active Member of Clubs or Organizations: No   . Attends Banker Meetings: Not on file   . Marital Status: Married  Catering manager Violence: Not on file    Past Surgical History:  Procedure Laterality Date  . BACK SURGERY    . CHOLECYSTECTOMY    . COLONOSCOPY N/A 09/05/2016   Procedure: COLONOSCOPY;  Surgeon: Suzette Espy, MD;  Location: [facility];  Service: Endoscopy;  Laterality: N/A;  2:15 PM  . ESOPHAGOGASTRODUODENOSCOPY    . LEFT ANKLE SURGERY    . LEFT HEART CATH AND CORONARY ANGIOGRAPHY N/A 09/17/2022   Procedure: LEFT HEART CATH AND CORONARY ANGIOGRAPHY;   Surgeon: Swaziland, Marcea Rojek M, MD;  Location: [facility] INVASIVE CV LAB;  Service: Cardiovascular;  Laterality: N/A;      Current Outpatient Medications:  .  aspirin  EC 81 MG tablet, Take 81 mg by mouth daily. Swallow whole., Disp: , Rfl:  .  atorvastatin  (LIPITOR) 80 MG tablet, Take 1 tablet (80 mg total) by mouth daily., Disp: 90 tablet, Rfl: 1 .  budesonide -formoterol  (SYMBICORT ) 160-4.5 MCG/ACT inhaler, INHALE 2 PUFFS TWICE DAILY, Disp: 10.2 g, Rfl: 2 .  buPROPion  (WELLBUTRIN  XL) 300 MG 24 hr tablet, Take 1 tablet (300 mg total) by mouth daily., Disp: 90 tablet, Rfl: 1 .  busPIRone  (BUSPAR ) 15 MG tablet, Take 1 tablet (15 mg total) by mouth 2 (two) times daily., Disp: 60 tablet, Rfl: 2 .  escitalopram  (LEXAPRO ) 20 MG tablet, Take 1 tablet (20 mg total) by mouth daily., Disp: 90 tablet, Rfl: 1 .  ezetimibe  (ZETIA ) 10 MG tablet, Take 1 tablet (10 mg total) by mouth at bedtime., Disp: 90 tablet, Rfl: 1 .  furosemide  (LASIX ) 20 MG tablet, Take 1 tablet (20 mg total) by mouth daily as needed for edema., Disp: , Rfl:  .  isosorbide  mononitrate (IMDUR ) 60 MG 24 hr tablet, Take 1 tablet (60 mg total) by mouth daily., Disp: 90 tablet, Rfl: 1 .  losartan  (COZAAR ) 100 MG tablet, Take 1 tablet (100 mg total) by mouth daily. Pt takes 1 tablet by mouth once a day., Disp: 90 tablet, Rfl: 1 .  meclizine  (ANTIVERT ) 25 MG tablet, TAKE 1 TABLET BY MOUTH THREE TIMES DAILY AS NEEDED FOR  DIZZINESS, Disp: 30 tablet, Rfl: 0 .  meloxicam  (MOBIC ) 15 MG tablet, Take 1 tablet by mouth once daily, Disp: 30 tablet, Rfl: 0 .  midodrine  (PROAMATINE ) 5 MG tablet, Take 1 tablet (5 mg total) by mouth daily as needed., Disp: 30 tablet, Rfl: 1 .  mupirocin  ointment (BACTROBAN ) 2 %, Apply 1 application. topically 2 (two) times daily. (Patient taking differently: Apply 1 application  topically 2 (two) times daily as needed (wound care).), Disp: 22 g, Rfl: 0 .  nitroGLYCERIN  (NITROSTAT ) 0.4 MG SL tablet, Place 1 tablet (0.4 mg total) under the  tongue every 5 (five) minutes x 3 doses as needed for chest pain., Disp: 25 tablet, Rfl: 12 .  omeprazole  (PRILOSEC) 40 MG capsule, Take 1 capsule (40 mg total) by mouth daily., Disp: 90 capsule, Rfl: 1 .  tirzepatide  (MOUNJARO ) 7.5 MG/0.5ML Pen, Inject 7.5 mg into the skin once a week., Disp: 2 mL, Rfl: 1 .  triamcinolone  cream (KENALOG ) 0.1 %, APPLY ONE APPLICATION TWICE DAILY (Patient taking differently: Apply 1 Application topically 2 (two) times daily  as needed (irritation).), Disp: 453.6 g, Rfl: 1 .  VENTOLIN  HFA 108 (90 Base) MCG/ACT inhaler, Inhale 2 puffs into the lungs every 6 (six) hours as needed for wheezing or shortness of breath., Disp: 18 g, Rfl: 1    Physical Exam: There were no vitals taken for this visit.    Affect appropriate Healthy:  appears stated age HEENT: normal Neck supple with no adenopathy JVP normal no bruits no thyromegaly Lungs clear with no wheezing and good diaphragmatic motion Heart:  S1/S2 no murmur, no rub, gallop or click PMI normal Abdomen: benighn, BS positve, no tenderness, no AAA no bruit.  No HSM or HJR Distal pulses intact with no bruits No edema Neuro non-focal Skin warm and dry No muscular weakness    Labs:   Lab Results  Component Value Date   WBC 9.5 03/31/2023   HGB 14.5 03/31/2023   HCT 42.7 03/31/2023   MCV 98 (H) 03/31/2023   PLT 259 03/31/2023   No results for input(s): "NA", "K", "CL", "CO2", "BUN", "CREATININE", "CALCIUM ", "PROT", "BILITOT", "ALKPHOS", "ALT", "AST", "GLUCOSE" in the last 168 hours.  Invalid input(s): "LABALBU" Lab Results  Component Value Date   CKTOTAL 78 06/19/2022   CKMB 0 06/19/2022    Lab Results  Component Value Date   CHOL 124 03/31/2023   CHOL 108 12/08/2022   CHOL 175 06/22/2022   Lab Results  Component Value Date   HDL 52 03/31/2023   HDL 53 12/08/2022   HDL 51 06/22/2022   Lab Results  Component Value Date   LDLCALC 53 03/31/2023   LDLCALC 37 12/08/2022   LDLCALC 85  06/22/2022   Lab Results  Component Value Date   TRIG 105 03/31/2023   TRIG 97 12/08/2022   TRIG 195 (H) 06/22/2022   Lab Results  Component Value Date   CHOLHDL 2.4 03/31/2023   CHOLHDL 2.0 12/08/2022   CHOLHDL 3.4 06/22/2022   No results found for: "LDLDIRECT"    Radiology: No results found.  EKG: ***  Cath 09/17/22   Multivessel CAD. 50% left main. Complex 90% proximal LAD. The LAD is calcified and tortuous. There is aneurysmal enlargement after the first diagonal. Despite multiple views and overlap it is difficult to tell if this is a true bifurcation lesion that involves the ostium of the diagonal or not. There is 70-75% disease in the mid RCA which is a large vessel. Normal LV function Mildly elevated LVEDP   Plan: at this point I would recommend initiating optimal medical therapy for angina. He needs aggressive risk factor modification. Based on his history it is unclear if his symptoms of dyspnea are ischemic or are more driven by pulmonary disease. I would recommend pulmonary evaluation. From a revascularization standpoint we could consider PCI of the LAD and mid RCA. The LAD lesion could turn out to be quite complex if it involves the diagonal origin and may require plaque modification due to calcification. In that case surgical revascularization may be more beneficial.   Echo 06/22/22  IMPRESSIONS     1. Left ventricular ejection fraction, by estimation, is 60 to 65%. The  left ventricle has normal function. The left ventricle has no regional  wall motion abnormalities. There is mild concentric left ventricular  hypertrophy. Left ventricular diastolic  parameters are consistent with Grade I diastolic dysfunction (impaired  relaxation).   2. Right ventricular systolic function is normal. The right ventricular  size is normal. Tricuspid regurgitation signal is inadequate for assessing  PA pressure.  3. The mitral valve is normal in structure. No evidence of mitral  valve  regurgitation. No evidence of mitral stenosis.   4. The aortic valve is tricuspid. Aortic valve regurgitation is not  visualized. No aortic stenosis is present.   5. The inferior vena cava is normal in size with greater than 50%  respiratory variability, suggesting right atrial pressure of 3 mmHg.   6. Agitated saline contrast bubble study was negative, with no evidence  of any interatrial shunt.     ASSESSMENT AND PLAN:   CAD:  proximal LAD 90% ? Involving D1 and 70% mid RCA. Per Dr Swaziland wanted to try medical Rx which is unusual with proximal LAD d/x Very high calcium  score and plaque volume. Concern for weight and pulmonary dx as symptoms mostly dyspnea. Currently on ASA, statin, zetia , imdur , ARB not on beta blocker ? Due to COPD. Has normal EF without significant valve dx by TTE 06/22/22 *** COPD/Smoking:  *** HLD:  on zetia  and statin with high LpA LDL at goal Weight Loss:  using Mounjaro  *** HTN:  stable on anti anginals and ARB  ***  Signed: Janelle Mediate 06/30/2023, 9:51 AM

## 2023-06-30 NOTE — Telephone Encounter (Signed)
Left second message for patient to call back.

## 2023-07-10 ENCOUNTER — Ambulatory Visit: Payer: Commercial Managed Care - PPO | Admitting: Cardiovascular Disease

## 2023-08-12 ENCOUNTER — Other Ambulatory Visit: Payer: Self-pay | Admitting: Nurse Practitioner

## 2023-08-22 ENCOUNTER — Other Ambulatory Visit: Payer: Self-pay | Admitting: Nurse Practitioner

## 2023-08-22 DIAGNOSIS — E119 Type 2 diabetes mellitus without complications: Secondary | ICD-10-CM

## 2023-08-26 ENCOUNTER — Other Ambulatory Visit (HOSPITAL_COMMUNITY): Payer: Self-pay

## 2023-08-26 ENCOUNTER — Telehealth: Payer: Self-pay

## 2023-08-26 NOTE — Telephone Encounter (Signed)
 Pharmacy Patient Advocate Encounter   Received notification from CoverMyMeds that prior authorization for Mounjaro  7.5MG /0.5ML auto-injectors  is required/requested.   Insurance verification completed.   The patient is insured through [facility] .   Per test claim: PA required; PA submitted to above mentioned insurance via Fax Key/confirmation #/EOC -- Status is pending  *faxed form with labs and chart notes to [phone]

## 2023-08-28 MED ORDER — TIRZEPATIDE-WEIGHT MANAGEMENT 2.5 MG/0.5ML ~~LOC~~ SOLN: 2.5000 mg | SUBCUTANEOUS | 2 refills | Status: DC

## 2023-08-28 NOTE — Telephone Encounter (Signed)
 Pharmacy Patient Advocate Encounter  Received notification from [facility] that Prior Authorization for Mounjaro  7.5MG /0.5ML auto-injectors  has been DENIED.  Full denial letter will be uploaded to the media tab. See denial reason below.

## 2023-08-28 NOTE — Telephone Encounter (Signed)
 Mounjario was denied because does not have diabetes- should have been prescribed zepbound- please have him call insurance to see if they will pay for zepbound

## 2023-08-28 NOTE — Telephone Encounter (Signed)
 Send Zepbound to pharmacy please

## 2023-09-02 ENCOUNTER — Other Ambulatory Visit: Payer: Self-pay | Admitting: Nurse Practitioner

## 2023-09-02 DIAGNOSIS — J4520 Mild intermittent asthma, uncomplicated: Secondary | ICD-10-CM

## 2023-09-07 ENCOUNTER — Telehealth: Payer: Self-pay | Admitting: Family Medicine

## 2023-09-07 NOTE — Telephone Encounter (Signed)
 Copied from CRM [id number]. Topic: Clinical - Medication Prior Auth >> Sep 07, 2023  4:06 PM Tobias L wrote: Reason for CRM: Patient calling back to check on prior authorization for mounjaro , advised denial due to patient not having diabetes. Patient states he does have diabetes and has not checked with insurance about zepbound .   Patient requesting callback, [phone]

## 2023-09-08 ENCOUNTER — Other Ambulatory Visit (HOSPITAL_COMMUNITY): Payer: Self-pay

## 2023-09-08 ENCOUNTER — Telehealth: Payer: Self-pay

## 2023-09-08 NOTE — Telephone Encounter (Signed)
Patient needs PA on Zepbound

## 2023-09-08 NOTE — Telephone Encounter (Signed)
 Please let patient know that zepbound  not covered by insurance

## 2023-09-08 NOTE — Telephone Encounter (Signed)
 Patient is aware.  He swears he is diabetic.  I explained that unfortunately we do not have A1C lab values that exceed 6.5 or greater.

## 2023-09-08 NOTE — Telephone Encounter (Signed)
 Pharmacy Patient Advocate Encounter  Received notification from [facility] that Prior Authorization for  Zepbound  2.5MG /0.5ML pen-injectors  has been DENIED.  No reason given; No denial letter received via Fax or CMM. It has been requested and will be uploaded to the media tab once received.  Drug is not covered by plan. It is excluded from plan benefits, per insurance rep Modafinil and Armodafinil are formulary for this plan. Prior shara is required.

## 2023-09-08 NOTE — Telephone Encounter (Signed)
 Pharmacy Patient Advocate Encounter   Received notification from CoverMyMeds that prior authorization for Zepbound  2.5MG /0.5ML pen-injectors is required/requested.   Insurance verification completed.   The patient is insured through [employer] .   Per test claim: PA required; PA submitted to above mentioned insurance via Latent Key/confirmation #/EOC [id number] Status is pending

## 2023-09-08 NOTE — Telephone Encounter (Signed)
 PA request has been Submitted. New Encounter has been or will be created for follow up. For additional info see Pharmacy Prior Auth telephone encounter from 09/08/2023.

## 2023-09-09 NOTE — Telephone Encounter (Signed)
 A1c on 06/22/22 was 6.7 in EPIC chart Patient has type 2 diabetes  MMM can you add type 2 back to problem list? Med assist team-->Please appeal and let us  know what additional info you need for notes/appeal Patient will carry diagnosis of type 2 dm indefinitely, however A1c may appear at goal<7% on therapy Patient should be able to get/continue on mounjaro  for his type 2 (has been filling for a awhile now)

## 2023-09-09 NOTE — Telephone Encounter (Signed)
 Can this help to get his Mounjaro  approved?

## 2023-09-09 NOTE — Telephone Encounter (Addendum)
 Patient called wanted to give David Ortega info about his monjauro . He states his A1c was 6.7 on 06/22/22

## 2023-09-09 NOTE — Telephone Encounter (Addendum)
 Patient called back to speak with Vinie about the PA for Mounjaro . Please f/u with patient.

## 2023-09-10 DIAGNOSIS — E119 Type 2 diabetes mellitus without complications: Secondary | ICD-10-CM | POA: Insufficient documentation

## 2023-09-15 NOTE — Telephone Encounter (Signed)
 Patient called again, checking status of PA appeal for [facility]

## 2023-09-23 ENCOUNTER — Telehealth: Payer: Self-pay | Admitting: Pharmacist

## 2023-09-23 ENCOUNTER — Other Ambulatory Visit (HOSPITAL_COMMUNITY): Payer: Self-pay

## 2023-09-23 NOTE — Telephone Encounter (Signed)
 Information has been sent to clinical pharmacist for appeals review. It may take 5-7 days to prepare the necessary documentation to request the appeal from the insurance.

## 2023-09-23 NOTE — Telephone Encounter (Signed)
 Appeal has been submitted for Mounjaro . Will advise when response is received, please be advised that most companies may take 30 days to make a decision.  Appeal letter and supporting information have been faxed to [phone] on 09/23/2023 @3 :39pm.  Thank you, Devere Pandy, PharmD Clinical Pharmacist    Direct Dial: [phone]

## 2023-09-29 ENCOUNTER — Other Ambulatory Visit (HOSPITAL_COMMUNITY): Payer: Self-pay

## 2023-10-01 ENCOUNTER — Ambulatory Visit: Payer: Self-pay | Admitting: Nurse Practitioner

## 2023-10-02 ENCOUNTER — Other Ambulatory Visit (HOSPITAL_COMMUNITY): Payer: Self-pay

## 2023-10-06 ENCOUNTER — Other Ambulatory Visit (HOSPITAL_COMMUNITY): Payer: Self-pay

## 2023-10-06 NOTE — Telephone Encounter (Signed)
Left detailed message per dpr  

## 2023-10-06 NOTE — Telephone Encounter (Signed)
 Based on test claim, appeal for Mounjaro  has been approved:

## 2023-12-13 ENCOUNTER — Other Ambulatory Visit: Payer: Self-pay | Admitting: *Deleted

## 2023-12-13 DIAGNOSIS — J4520 Mild intermittent asthma, uncomplicated: Secondary | ICD-10-CM

## 2023-12-20 ENCOUNTER — Other Ambulatory Visit: Payer: Self-pay | Admitting: Nurse Practitioner

## 2023-12-23 ENCOUNTER — Other Ambulatory Visit: Payer: Self-pay | Admitting: Nurse Practitioner

## 2023-12-23 DIAGNOSIS — E119 Type 2 diabetes mellitus without complications: Secondary | ICD-10-CM

## 2024-01-11 ENCOUNTER — Other Ambulatory Visit: Payer: Self-pay | Admitting: Nurse Practitioner

## 2024-01-19 ENCOUNTER — Other Ambulatory Visit: Payer: Self-pay | Admitting: Nurse Practitioner

## 2024-01-19 DIAGNOSIS — E119 Type 2 diabetes mellitus without complications: Secondary | ICD-10-CM

## 2024-01-19 MED ORDER — MOUNJARO 7.5 MG/0.5ML ~~LOC~~ SOAJ: 7.5000 mg | SUBCUTANEOUS | 0 refills | Status: DC

## 2024-01-19 NOTE — Telephone Encounter (Signed)
 MMM pt NTBS 30-d given 12/23/23

## 2024-01-19 NOTE — Addendum Note (Signed)
 Addended by: Bell Carbo D on: 01/19/2024 04:55 PM   Modules accepted: Orders

## 2024-01-19 NOTE — Telephone Encounter (Signed)
 Patient has appt 12-26 with MMM for med refills

## 2024-01-22 ENCOUNTER — Ambulatory Visit: Payer: Self-pay | Admitting: Nurse Practitioner

## 2024-01-22 ENCOUNTER — Encounter: Payer: Self-pay | Admitting: Nurse Practitioner

## 2024-01-22 VITALS — BP 134/83 | HR 73 | Temp 97.8°F | Ht 71.0 in | Wt 238.0 lb

## 2024-01-22 DIAGNOSIS — Z6839 Body mass index (BMI) 39.0-39.9, adult: Secondary | ICD-10-CM

## 2024-01-22 DIAGNOSIS — F3341 Major depressive disorder, recurrent, in partial remission: Secondary | ICD-10-CM

## 2024-01-22 DIAGNOSIS — I10 Essential (primary) hypertension: Secondary | ICD-10-CM | POA: Diagnosis not present

## 2024-01-22 DIAGNOSIS — E782 Mixed hyperlipidemia: Secondary | ICD-10-CM

## 2024-01-22 DIAGNOSIS — G4733 Obstructive sleep apnea (adult) (pediatric): Secondary | ICD-10-CM

## 2024-01-22 DIAGNOSIS — K219 Gastro-esophageal reflux disease without esophagitis: Secondary | ICD-10-CM | POA: Diagnosis not present

## 2024-01-22 DIAGNOSIS — J4520 Mild intermittent asthma, uncomplicated: Secondary | ICD-10-CM | POA: Diagnosis not present

## 2024-01-22 DIAGNOSIS — I200 Unstable angina: Secondary | ICD-10-CM | POA: Diagnosis not present

## 2024-01-22 DIAGNOSIS — E119 Type 2 diabetes mellitus without complications: Secondary | ICD-10-CM | POA: Diagnosis not present

## 2024-01-22 LAB — CMP14+EGFR
ALT: 9 IU/L (ref 0–44)
AST: 12 IU/L (ref 0–40)
Albumin: 3.8 g/dL — ABNORMAL LOW (ref 3.9–4.9)
Alkaline Phosphatase: 117 IU/L (ref 47–123)
BUN/Creatinine Ratio: 19 (ref 10–24)
BUN: 23 mg/dL (ref 8–27)
Bilirubin Total: 0.5 mg/dL (ref 0.0–1.2)
CO2: 23 mmol/L (ref 20–29)
Calcium: 9.2 mg/dL (ref 8.6–10.2)
Chloride: 103 mmol/L (ref 96–106)
Creatinine, Ser: 1.18 mg/dL (ref 0.76–1.27)
Globulin, Total: 2.5 g/dL (ref 1.5–4.5)
Glucose: 98 mg/dL (ref 70–99)
Potassium: 4.4 mmol/L (ref 3.5–5.2)
Sodium: 138 mmol/L (ref 134–144)
Total Protein: 6.3 g/dL (ref 6.0–8.5)
eGFR: 69 mL/min/1.73

## 2024-01-22 LAB — CBC WITH DIFFERENTIAL/PLATELET
Basophils Absolute: 0 x10E3/uL (ref 0.0–0.2)
Basos: 0 %
EOS (ABSOLUTE): 0.3 x10E3/uL (ref 0.0–0.4)
Eos: 3 %
Hematocrit: 40.7 % (ref 37.5–51.0)
Hemoglobin: 13.4 g/dL (ref 13.0–17.7)
Immature Grans (Abs): 0 x10E3/uL (ref 0.0–0.1)
Immature Granulocytes: 0 %
Lymphocytes Absolute: 2.4 x10E3/uL (ref 0.7–3.1)
Lymphs: 30 %
MCH: 32.1 pg (ref 26.6–33.0)
MCHC: 32.9 g/dL (ref 31.5–35.7)
MCV: 98 fL — ABNORMAL HIGH (ref 79–97)
Monocytes Absolute: 0.6 x10E3/uL (ref 0.1–0.9)
Monocytes: 7 %
Neutrophils Absolute: 4.6 x10E3/uL (ref 1.4–7.0)
Neutrophils: 60 %
Platelets: 235 x10E3/uL (ref 150–450)
RBC: 4.17 x10E6/uL (ref 4.14–5.80)
RDW: 13.4 % (ref 11.6–15.4)
WBC: 7.8 x10E3/uL (ref 3.4–10.8)

## 2024-01-22 LAB — LIPID PANEL
Chol/HDL Ratio: 2.8 ratio (ref 0.0–5.0)
Cholesterol, Total: 144 mg/dL (ref 100–199)
HDL: 52 mg/dL
LDL Chol Calc (NIH): 72 mg/dL (ref 0–99)
Triglycerides: 113 mg/dL (ref 0–149)
VLDL Cholesterol Cal: 20 mg/dL (ref 5–40)

## 2024-01-22 LAB — BAYER DCA HB A1C WAIVED: HB A1C (BAYER DCA - WAIVED): 5.4 % (ref 4.8–5.6)

## 2024-01-22 MED ORDER — FUROSEMIDE 20 MG PO TABS: 20.0000 mg | ORAL_TABLET | Freq: Every day | ORAL | 1 refills | Status: AC | PRN

## 2024-01-22 MED ORDER — ATORVASTATIN CALCIUM 80 MG PO TABS: 80.0000 mg | ORAL_TABLET | Freq: Every day | ORAL | 1 refills | Status: AC

## 2024-01-22 MED ORDER — EZETIMIBE 10 MG PO TABS: 10.0000 mg | ORAL_TABLET | Freq: Every day | ORAL | 1 refills | Status: AC

## 2024-01-22 MED ORDER — BUDESONIDE-FORMOTEROL FUMARATE 160-4.5 MCG/ACT IN AERO: 2.0000 | INHALATION_SPRAY | Freq: Two times a day (BID) | RESPIRATORY_TRACT | 0 refills | Status: DC

## 2024-01-22 MED ORDER — ISOSORBIDE MONONITRATE ER 60 MG PO TB24: 60.0000 mg | ORAL_TABLET | Freq: Every day | ORAL | 1 refills | Status: AC

## 2024-01-22 MED ORDER — BUSPIRONE HCL 15 MG PO TABS: 15.0000 mg | ORAL_TABLET | Freq: Two times a day (BID) | ORAL | 2 refills | Status: AC

## 2024-01-22 MED ORDER — MOUNJARO 7.5 MG/0.5ML ~~LOC~~ SOAJ: 7.5000 mg | SUBCUTANEOUS | 0 refills | Status: AC

## 2024-01-22 MED ORDER — ESCITALOPRAM OXALATE 20 MG PO TABS: 20.0000 mg | ORAL_TABLET | Freq: Every day | ORAL | 1 refills | Status: AC

## 2024-01-22 MED ORDER — LOSARTAN POTASSIUM 100 MG PO TABS: 100.0000 mg | ORAL_TABLET | Freq: Every day | ORAL | 1 refills | Status: AC

## 2024-01-22 MED ORDER — BUPROPION HCL ER (XL) 300 MG PO TB24: 300.0000 mg | ORAL_TABLET | Freq: Every day | ORAL | 1 refills | Status: AC

## 2024-01-22 MED ORDER — OMEPRAZOLE 40 MG PO CPDR: 40.0000 mg | DELAYED_RELEASE_CAPSULE | Freq: Every day | ORAL | 1 refills | Status: AC

## 2024-01-22 NOTE — Progress Notes (Signed)
 "  Subjective:    Patient ID: David Ortega, male    DOB: [birth year], 63 y.o.   MRN: [id number]   Chief Complaint: medical management of chronic issues     HPI:  David Ortega is a 63 y.o. who identifies as a male who was assigned male at birth.   Social history: Lives with: wife Work history: works on trucks   Comes in today for follow up of the following chronic medical issues:  1. Primary hypertension No c/o  sob or headache. Does not check blood pressure at home. BP Readings from Last 3 Encounters:  03/31/23 (!) 140/85  12/08/22 133/84  10/01/22 128/80     2. Mixed hyperlipidemia Does not watch diet and does no exercise at all Lab Results  Component Value Date   CHOL 124 03/31/2023   HDL 52 03/31/2023   LDLCALC 53 03/31/2023   TRIG 105 03/31/2023   CHOLHDL 2.4 03/31/2023   The ASCVD Risk score (Arnett DK, et al., 2019) failed to calculate for the following reasons:   The valid total cholesterol range is 130 to 320 mg/dL   3. Gastroesophageal reflux disease without esophagitis Is on omeprazole  daily and is doing well  4. OSA (obstructive sleep apnea) Refuses to wear cpp at night  5. Recurrent major depressive disorder, in partial remission (HCC) Is on wellbutrin , lexapro  and buspar  daily. Is doing well most of he time     03/31/2023    4:02 PM 12/08/2022    3:06 PM 06/19/2022    8:39 AM  Depression screen PHQ 2/9  Decreased Interest 1 1 1   Down, Depressed, Hopeless 0 0 0  PHQ - 2 Score 1 1 1   Altered sleeping 1 1 1   Tired, decreased energy 1 1 1   Change in appetite 0 0 0  Feeling bad or failure about yourself  0 0 0  Trouble concentrating 0 0 0  Moving slowly or fidgety/restless 0 0 0  Suicidal thoughts 0 0 0  PHQ-9 Score 3  3  3    Difficult doing work/chores Somewhat difficult Somewhat difficult Not difficult at all     Data saved with a previous flowsheet row definition       6. Unstable angina No chest pain since last  visit.   7. BMI 39.0-39.9,adult Weight is down 22lbs. He is on [employer] Readings from Last 3 Encounters:  01/22/24 238 lb (108 kg)  03/31/23 260 lb (117.9 kg)  12/08/22 279 lb (126.6 kg)   BMI Readings from Last 3 Encounters:  01/22/24 33.19 kg/m  03/31/23 36.26 kg/m  12/08/22 38.91 kg/m           Allergies  Allergen Reactions   Propranolol  Shortness Of Breath   Lisinopril Cough   Simvastatin      Leg pain   Outpatient Encounter Medications as of 01/22/2024  Medication Sig   aspirin  EC 81 MG tablet Take 81 mg by mouth daily. Swallow whole.   atorvastatin  (LIPITOR) 80 MG tablet Take 1 tablet (80 mg total) by mouth daily.   budesonide -formoterol  (BREYNA ) 160-4.5 MCG/ACT inhaler Inhale 2 puffs into the lungs 2 (two) times daily. **NEEDS TO BE SEEN BEFORE NEXT REFILL**   buPROPion  (WELLBUTRIN  XL) 300 MG 24 hr tablet Take 1 tablet (300 mg total) by mouth daily.   busPIRone  (BUSPAR ) 15 MG tablet Take 1 tablet (15 mg total) by mouth 2 (two) times daily.   escitalopram  (LEXAPRO ) 20 MG tablet Take 1 tablet (20 mg total)  by mouth daily.   ezetimibe  (ZETIA ) 10 MG tablet Take 1 tablet (10 mg total) by mouth at bedtime.   furosemide  (LASIX ) 20 MG tablet Take 1 tablet (20 mg total) by mouth daily as needed for edema.   isosorbide  mononitrate (IMDUR ) 60 MG 24 hr tablet Take 1 tablet (60 mg total) by mouth daily.   losartan  (COZAAR ) 100 MG tablet Take 1 tablet (100 mg total) by mouth daily. Pt takes 1 tablet by mouth once a day.   meclizine  (ANTIVERT ) 25 MG tablet TAKE 1 TABLET BY MOUTH THREE TIMES DAILY AS NEEDED FOR  DIZZINESS   meloxicam  (MOBIC ) 15 MG tablet Take 1 tablet by mouth once daily   midodrine  (PROAMATINE ) 5 MG tablet Take 1 tablet (5 mg total) by mouth daily as needed.   mupirocin  ointment (BACTROBAN ) 2 % Apply 1 application. topically 2 (two) times daily. (Patient taking differently: Apply 1 application  topically 2 (two) times daily as needed (wound care).)    nitroGLYCERIN  (NITROSTAT ) 0.4 MG SL tablet Place 1 tablet (0.4 mg total) under the tongue every 5 (five) minutes x 3 doses as needed for chest pain.   omeprazole  (PRILOSEC) 40 MG capsule Take 1 capsule (40 mg total) by mouth daily.   tirzepatide  (MOUNJARO ) 7.5 MG/0.5ML Pen Inject 7.5 mg into the skin once a week.   tirzepatide  (ZEPBOUND ) 2.5 MG/0.5ML injection vial Inject 2.5 mg into the skin once a week.   triamcinolone  cream (KENALOG ) 0.1 % APPLY ONE APPLICATION TWICE DAILY (Patient taking differently: Apply 1 Application topically 2 (two) times daily as needed (irritation).)   VENTOLIN  HFA 108 (90 Base) MCG/ACT inhaler Inhale 2 puffs into the lungs every 6 (six) hours as needed for wheezing or shortness of breath. **NEEDS TO BE SEEN BEFORE NEXT REFILL**   No facility-administered encounter medications on file as of 01/22/2024.    Past Surgical History:  Procedure Laterality Date   BACK SURGERY     CHOLECYSTECTOMY     COLONOSCOPY N/A 09/05/2016   Procedure: COLONOSCOPY;  Surgeon: Shaaron Lamar HERO, MD;  Location: [facility];  Service: Endoscopy;  Laterality: N/A;  2:15 PM   ESOPHAGOGASTRODUODENOSCOPY     LEFT ANKLE SURGERY     LEFT HEART CATH AND CORONARY ANGIOGRAPHY N/A 09/17/2022   Procedure: LEFT HEART CATH AND CORONARY ANGIOGRAPHY;  Surgeon: Jordan, Peter M, MD;  [facility];  Service: Cardiovascular;  Laterality: N/A;    Family History  Problem Relation Age of Onset   Diabetes Mother    Heart disease Mother    Diabetes Sister    Cancer Brother    Cancer Sister        pancreatic      Controlled substance contract: n/a     Review of Systems  Constitutional:  Negative for diaphoresis.  Eyes:  Negative for pain.  Respiratory:  Negative for shortness of breath.   Cardiovascular:  Negative for chest pain, palpitations and leg swelling.  Gastrointestinal:  Negative for abdominal pain.  Endocrine: Negative for polydipsia.  Skin:  Negative for rash.   Neurological:  Negative for dizziness, weakness and headaches.  Hematological:  Does not bruise/bleed easily.  All other systems reviewed and are negative.      Objective:   Physical Exam Vitals and nursing note reviewed.  Constitutional:      Appearance: Normal appearance. He is well-developed.  HENT:     Head: Normocephalic.     Nose: Nose normal.     Mouth/Throat:  Mouth: Mucous membranes are moist.     Pharynx: Oropharynx is clear.  Eyes:     Pupils: Pupils are equal, round, and reactive to light.  Neck:     Thyroid : No thyroid  mass or thyromegaly.     Vascular: No carotid bruit or JVD.     Trachea: Phonation normal.  Cardiovascular:     Rate and Rhythm: Normal rate and regular rhythm.  Pulmonary:     Effort: Pulmonary effort is normal. No respiratory distress.     Breath sounds: Normal breath sounds.  Abdominal:     General: Bowel sounds are normal.     Palpations: Abdomen is soft.     Tenderness: There is no abdominal tenderness.  Musculoskeletal:        General: Normal range of motion.     Cervical back: Normal range of motion and neck supple.  Lymphadenopathy:     Cervical: No cervical adenopathy.  Skin:    General: Skin is warm and dry.  Neurological:     Mental Status: He is alert and oriented to person, place, and time.  Psychiatric:        Behavior: Behavior normal.        Thought Content: Thought content normal.        Judgment: Judgment normal.     BP 134/83   Pulse 73   Temp 97.8 F (36.6 C) (Temporal)   Ht 5' 11 (1.803 m)   Wt 238 lb (108 kg)   SpO2 96%   BMI 33.19 kg/m     Hgba1c 5.4%     Assessment & Plan:  David Ortega comes in today with chief complaint of medical management of chronic issues    Diagnosis and orders addressed:  1. Primary hypertension Low sodium diet - CBC with Differential/Platelet - CMP14+EGFR - furosemide  (LASIX ) 20 MG tablet; Take 1 tablet (20 mg total) by mouth daily as needed for edema.   Dispense: 90 tablet; Refill: 1  2. Mixed hyperlipidemia Low fat diet - Lipid panel - atorvastatin  (LIPITOR) 80 MG tablet; Take 1 tablet (80 mg total) by mouth daily.  Dispense: 90 tablet; Refill: 1  3. Gastroesophageal reflux disease without esophagitis Avoid spicy foods Do not eat 2 hours prior to bedtime   4. OSA (obstructive sleep apnea) Wear CPAP  5. Recurrent major depressive disorder, in partial remission (HCC) Stress management - buPROPion  (WELLBUTRIN  XL) 300 MG 24 hr tablet; Take 1 tablet (300 mg total) by mouth daily.  Dispense: 90 tablet; Refill: 1 - busPIRone  (BUSPAR ) 15 MG tablet; Take 1 tablet (15 mg total) by mouth 2 (two) times daily.  Dispense: 60 tablet; Refill: 2 - escitalopram  (LEXAPRO ) 20 MG tablet; Take 1 tablet (20 mg total) by mouth daily.  Dispense: 90 tablet; Refill: 1  6. BMI 39.0-39.9,adult Discussed diet and exercise for person with BMI >25 Will recheck weight in 3-6 months   7. Elevated blood sugar - Bayer DCA Hb A1c Waived  8. Diet-controlled diabetes mellitus (HCC) On  mounjario  11. Asthma, chronic, mild intermittent, uncomplicated - budesonide -formoterol  (SYMBICORT ) 160-4.5 MCG/ACT inhaler; Inhale 2 puffs into the lungs 2 (two) times daily.  Dispense: 33 g; Refill: 3   Labs pending Health Maintenance reviewed Diet and exercise encouraged  Follow up plan: 6 months   Mary-Margaret Gladis, FNP   "

## 2024-01-22 NOTE — Patient Instructions (Signed)

## 2024-01-23 ENCOUNTER — Ambulatory Visit: Payer: Self-pay | Admitting: Nurse Practitioner

## 2024-02-19 ENCOUNTER — Other Ambulatory Visit: Payer: Self-pay | Admitting: Nurse Practitioner

## 2024-02-19 DIAGNOSIS — J4520 Mild intermittent asthma, uncomplicated: Secondary | ICD-10-CM

## 2024-03-03 ENCOUNTER — Other Ambulatory Visit: Payer: Self-pay | Admitting: Nurse Practitioner

## 2024-07-25 ENCOUNTER — Ambulatory Visit: Admitting: Nurse Practitioner
